# Patient Record
Sex: Male | Born: 1935 | Race: White | Hispanic: No | Smoking: Former smoker
Health system: Southern US, Community
[De-identification: ages and names within clinical notes are randomized; demographics above are authoritative.]

## PROBLEM LIST (undated history)

## (undated) DIAGNOSIS — I10 Essential (primary) hypertension: Secondary | ICD-10-CM

## (undated) DIAGNOSIS — I6529 Occlusion and stenosis of unspecified carotid artery: Secondary | ICD-10-CM

## (undated) DIAGNOSIS — E785 Hyperlipidemia, unspecified: Secondary | ICD-10-CM

## (undated) DIAGNOSIS — M1710 Unilateral primary osteoarthritis, unspecified knee: Secondary | ICD-10-CM

## (undated) DIAGNOSIS — M79604 Pain in right leg: Secondary | ICD-10-CM

## (undated) DIAGNOSIS — M549 Dorsalgia, unspecified: Secondary | ICD-10-CM

## (undated) DIAGNOSIS — M1990 Unspecified osteoarthritis, unspecified site: Secondary | ICD-10-CM

## (undated) DIAGNOSIS — H269 Unspecified cataract: Secondary | ICD-10-CM

## (undated) DIAGNOSIS — N400 Enlarged prostate without lower urinary tract symptoms: Secondary | ICD-10-CM

## (undated) DIAGNOSIS — M545 Low back pain, unspecified: Secondary | ICD-10-CM

## (undated) DIAGNOSIS — Z8709 Personal history of other diseases of the respiratory system: Secondary | ICD-10-CM

## (undated) DIAGNOSIS — R350 Frequency of micturition: Secondary | ICD-10-CM

## (undated) DIAGNOSIS — T148XXA Other injury of unspecified body region, initial encounter: Secondary | ICD-10-CM

## (undated) DIAGNOSIS — R21 Rash and other nonspecific skin eruption: Secondary | ICD-10-CM

## (undated) HISTORY — DX: Dorsalgia, unspecified: M54.9

## (undated) HISTORY — DX: Occlusion and stenosis of unspecified carotid artery: I65.29

## (undated) HISTORY — PX: KNEE SURGERY: SHX244

## (undated) HISTORY — PX: NOSE SURGERY: SHX723

## (undated) HISTORY — PX: CATARACT EXTRACTION: SUR2

## (undated) HISTORY — DX: Hyperlipidemia, unspecified: E78.5

## (undated) HISTORY — PX: COLONOSCOPY W/ BIOPSIES AND POLYPECTOMY: SHX1376

## (undated) HISTORY — PX: TONSILLECTOMY: SUR1361

## (undated) HISTORY — PX: CAROTID ENDARTERECTOMY: SUR193

## (undated) HISTORY — DX: Essential (primary) hypertension: I10

## (undated) HISTORY — DX: Pain in right leg: M79.604

## (undated) HISTORY — PX: HERNIA REPAIR: SHX51

## (undated) HISTORY — DX: Unspecified cataract: H26.9

## (undated) HISTORY — DX: Personal history of other diseases of the respiratory system: Z87.09

## (undated) HISTORY — PX: NASAL POLYPECTOMY: SHX510331

## (undated) HISTORY — DX: Benign prostatic hyperplasia without lower urinary tract symptoms: N40.0

## (undated) HISTORY — DX: Other injury of unspecified body region, initial encounter: T14.8XXA

## (undated) HISTORY — DX: Rash and other nonspecific skin eruption: R21

## (undated) HISTORY — PX: EYE SURGERY: SHX253

## (undated) HISTORY — DX: Low back pain, unspecified: M54.50

## (undated) HISTORY — DX: Unspecified osteoarthritis, unspecified site: M19.90

## (undated) HISTORY — PX: COLONOSCOPY: SHX174

## (undated) HISTORY — DX: Frequency of micturition: R35.0

---

## 1986-07-19 HISTORY — PX: HERNIA REPAIR: SHX51

## 1994-06-23 ENCOUNTER — Ambulatory Visit
Admit: 1994-06-23 | Disposition: A | Discharge status: Home / Self Care | Payer: Self-pay | Source: Ambulatory Visit | Admitting: Internal Medicine

## 1994-07-08 ENCOUNTER — Ambulatory Visit
Admit: 1994-07-08 | Disposition: A | Discharge status: Home / Self Care | Payer: Self-pay | Source: Ambulatory Visit | Admitting: Internal Medicine

## 1994-09-21 ENCOUNTER — Ambulatory Visit
Admit: 1994-09-21 | Disposition: A | Discharge status: Home / Self Care | Payer: Self-pay | Source: Ambulatory Visit | Admitting: Internal Medicine

## 1995-05-16 ENCOUNTER — Ambulatory Visit
Admit: 1995-05-16 | Disposition: A | Discharge status: Home / Self Care | Payer: Self-pay | Source: Ambulatory Visit | Admitting: Otolaryngology

## 1995-05-27 ENCOUNTER — Ambulatory Visit
Admit: 1995-05-27 | Disposition: A | Discharge status: Home / Self Care | Payer: Self-pay | Source: Ambulatory Visit | Admitting: Otolaryngology

## 1995-05-27 ENCOUNTER — Ambulatory Visit
Admit: 1995-05-27 | Disposition: A | Discharge status: Home / Self Care | Payer: Self-pay | Source: Ambulatory Visit | Admitting: Internal Medicine

## 1996-01-19 ENCOUNTER — Emergency Department: Admit: 1996-01-19 | Payer: Self-pay | Admitting: Emergency Medicine

## 1996-01-24 ENCOUNTER — Emergency Department: Admit: 1996-01-24 | Payer: Self-pay | Admitting: Emergency Medicine

## 1996-01-24 ENCOUNTER — Ambulatory Visit
Admit: 1996-01-24 | Disposition: A | Discharge status: Home / Self Care | Payer: Self-pay | Source: Ambulatory Visit | Admitting: Internal Medicine

## 1996-01-27 ENCOUNTER — Emergency Department: Admit: 1996-01-27 | Payer: Self-pay

## 1996-01-31 ENCOUNTER — Emergency Department: Admit: 1996-01-31 | Payer: Self-pay | Admitting: Emergency Medicine

## 1996-02-07 ENCOUNTER — Emergency Department: Admit: 1996-02-07 | Payer: Self-pay | Admitting: Emergency Medicine

## 1996-02-21 ENCOUNTER — Emergency Department: Admit: 1996-02-21 | Payer: Self-pay | Admitting: Emergency Medicine

## 1996-06-29 ENCOUNTER — Ambulatory Visit
Admit: 1996-06-29 | Disposition: A | Discharge status: Home / Self Care | Payer: Self-pay | Source: Ambulatory Visit | Admitting: Internal Medicine

## 1997-07-24 ENCOUNTER — Ambulatory Visit
Admit: 1997-07-24 | Disposition: A | Discharge status: Home / Self Care | Payer: Self-pay | Source: Ambulatory Visit | Admitting: Internal Medicine

## 1997-12-04 ENCOUNTER — Ambulatory Visit
Admit: 1997-12-04 | Disposition: A | Discharge status: Home / Self Care | Payer: Self-pay | Source: Ambulatory Visit | Admitting: Internal Medicine

## 1998-07-31 ENCOUNTER — Ambulatory Visit
Admit: 1998-07-31 | Disposition: A | Discharge status: Home / Self Care | Payer: Self-pay | Source: Ambulatory Visit | Admitting: Internal Medicine

## 1999-07-30 ENCOUNTER — Ambulatory Visit
Admit: 1999-07-30 | Disposition: A | Discharge status: Home / Self Care | Payer: Self-pay | Source: Ambulatory Visit | Admitting: Internal Medicine

## 2000-06-06 ENCOUNTER — Ambulatory Visit
Admit: 2000-06-06 | Disposition: A | Discharge status: Home / Self Care | Payer: Self-pay | Source: Ambulatory Visit | Admitting: Internal Medicine

## 2000-07-07 ENCOUNTER — Ambulatory Visit
Admit: 2000-07-07 | Disposition: A | Discharge status: Home / Self Care | Payer: Self-pay | Source: Ambulatory Visit | Admitting: Internal Medicine

## 2001-07-24 ENCOUNTER — Ambulatory Visit
Admit: 2001-07-24 | Disposition: A | Discharge status: Home / Self Care | Payer: Self-pay | Source: Ambulatory Visit | Admitting: Internal Medicine

## 2002-07-27 ENCOUNTER — Ambulatory Visit
Admit: 2002-07-27 | Disposition: A | Discharge status: Home / Self Care | Payer: Self-pay | Source: Ambulatory Visit | Admitting: Internal Medicine

## 2002-09-13 ENCOUNTER — Ambulatory Visit
Admit: 2002-09-13 | Disposition: A | Discharge status: Home / Self Care | Payer: Self-pay | Source: Ambulatory Visit | Admitting: Internal Medicine

## 2002-09-14 ENCOUNTER — Ambulatory Visit
Admit: 2002-09-14 | Disposition: A | Discharge status: Home / Self Care | Payer: Self-pay | Source: Ambulatory Visit | Admitting: Internal Medicine

## 2003-07-24 ENCOUNTER — Ambulatory Visit
Admit: 2003-07-24 | Disposition: A | Discharge status: Home / Self Care | Payer: Self-pay | Source: Ambulatory Visit | Admitting: Internal Medicine

## 2004-07-24 ENCOUNTER — Ambulatory Visit
Admit: 2004-07-24 | Disposition: A | Discharge status: Home / Self Care | Payer: Self-pay | Source: Ambulatory Visit | Admitting: Internal Medicine

## 2004-08-18 ENCOUNTER — Ambulatory Visit
Admit: 2004-08-18 | Disposition: A | Discharge status: Home / Self Care | Payer: Self-pay | Source: Ambulatory Visit | Admitting: Internal Medicine

## 2005-08-16 ENCOUNTER — Ambulatory Visit
Admit: 2005-08-16 | Disposition: A | Discharge status: Home / Self Care | Payer: Self-pay | Source: Ambulatory Visit | Admitting: Internal Medicine

## 2005-08-16 LAB — URINALYSIS
Bilirubin, UA: NEGATIVE
Glucose, UA: NEGATIVE
Ketones UA: NEGATIVE
Leukocyte Esterase, UA: NEGATIVE
Nitrite, UA: NEGATIVE
Protein, UR: NEGATIVE
Specific Gravity UA POCT: 1.025 (ref <=1.030)
Urine pH: 5 (ref 5.0–8.0)
Urobilinogen, UA: 0.2

## 2005-08-16 LAB — CBC WITH AUTO DIFFERENTIAL CERNER
Basophils Absolute: 0.1 /mm3 (ref 0.0–0.2)
Basophils: 1 % (ref 0–2)
Eosinophils Absolute: 0.3 /mm3 (ref 0.0–0.7)
Eosinophils: 5 % (ref 0–5)
Granulocytes Absolute: 3.3 /mm3 (ref 1.8–8.1)
Hematocrit: 52 % (ref 42.0–52.0)
Hgb: 17.9 G/DL — ABNORMAL HIGH (ref 13.0–17.0)
Lymphocytes Absolute: 1.5 /mm3 (ref 0.5–4.4)
Lymphocytes: 26 % (ref 15–41)
MCH: 32.2 PG — ABNORMAL HIGH (ref 28.0–32.0)
MCHC: 34.5 G/DL (ref 32.0–36.0)
MCV: 93.3 FL (ref 80.0–100.0)
MPV: 7.9 FL (ref 7.4–10.4)
Monocytes Absolute: 0.5 /mm3 (ref 0.0–1.2)
Monocytes: 9 % (ref 0–11)
Neutrophils %: 59 % (ref 52–75)
Platelets: 195 /mm3 (ref 140–400)
RBC: 5.57 /mm3 (ref 4.70–6.00)
RDW: 12.8 % (ref 11.5–15.0)
WBC: 5.6 /mm3 (ref 3.5–10.8)

## 2005-08-16 LAB — URINE MICROSCOPIC

## 2005-08-16 LAB — LIPID PANEL
Cholesterol: 173 MG/DL (ref ?–200)
HDL: 45 MG/DL (ref 40–60)
LDL Calculated: 106 MG/DL (ref 0–130)
Triglycerides: 108 MG/DL (ref 35–160)
VLDL Calculated: 22 MG/DL (ref 10–40)

## 2005-08-16 LAB — COMPREHENSIVE METABOLIC PANEL
ALT: 33 U/L (ref 21–72)
AST (SGOT): 27 U/L (ref 20–57)
Albumin/Globulin Ratio: 1.2 (ref 1.1–1.8)
Albumin: 4.2 G/DL (ref 3.7–5.1)
Alkaline Phosphatase: 67 U/L (ref 43–122)
BUN: 14 MG/DL (ref 7–21)
Bilirubin, Total: 0.9 MG/DL (ref 0.2–1.3)
CO2: 25 MEQ/L (ref 22–31)
Calcium: 9.8 MG/DL (ref 8.6–10.2)
Chloride: 105 MEQ/L (ref 98–107)
Creatinine: 1 MG/DL (ref 0.5–1.4)
Globulin: 3.5 G/DL (ref 2.0–3.7)
Glucose: 106 MG/DL — ABNORMAL HIGH (ref 70–105)
Potassium: 4.1 MEQ/L (ref 3.6–5.0)
Protein, Total: 7.7 G/DL (ref 6.0–8.0)
Sodium: 143 MEQ/L (ref 136–143)

## 2005-08-16 LAB — CALCIUM IONIZED-CALC. CERNER: Calcium Ionized Calculated: 2.1 mEQ/L (ref 1.9–2.3)

## 2005-08-16 LAB — VITAMIN B12: Vitamin B-12: 839 PG/ML (ref 239–931)

## 2005-08-16 LAB — PROSTATE SPECIFIC ANTIGEN FREE/TOTA (SOFT)
Free PSA-: 0.24 ng/mL
Free PSA/Total PSA: 0.31 RATIO
PSA Total: 0.778 ng/mL (ref 0.000–4.000)

## 2005-08-16 LAB — HOMOCYSTEINE, SERUM: HOMOCYSTEINE: 10 umol/L (ref 5–15)

## 2005-08-16 LAB — TSH: TSH: 1.72 u[IU]/mL (ref 0.34–4.82)

## 2005-08-16 LAB — GLYCO HEMOGLOBIN A1C CERNER: HgA1C Calc: 6 % (ref 4.8–6.0)

## 2005-08-16 LAB — FOLATE: Folate: 14.6 ng/dL (ref 2.8–20.0)

## 2005-08-16 LAB — GFR

## 2005-08-31 ENCOUNTER — Ambulatory Visit
Admit: 2005-08-31 | Disposition: A | Discharge status: Home / Self Care | Payer: Self-pay | Source: Ambulatory Visit | Admitting: Internal Medicine

## 2006-08-18 ENCOUNTER — Ambulatory Visit
Admit: 2006-08-18 | Disposition: A | Discharge status: Home / Self Care | Payer: Self-pay | Source: Ambulatory Visit | Admitting: Internal Medicine

## 2006-08-18 LAB — CBC WITH AUTO DIFFERENTIAL CERNER
Basophils Absolute: 0 /mm3 (ref 0.0–0.2)
Basophils: 1 % (ref 0–2)
Eosinophils Absolute: 0.2 /mm3 (ref 0.0–0.7)
Eosinophils: 4 % (ref 0–5)
Granulocytes Absolute: 3.5 /mm3 (ref 1.8–8.1)
Hematocrit: 46.9 % (ref 42.0–52.0)
Hgb: 16.6 G/DL (ref 13.0–17.0)
Lymphocytes Absolute: 1.6 /mm3 (ref 0.5–4.4)
Lymphocytes: 27 % (ref 15–41)
MCH: 32.3 PG — ABNORMAL HIGH (ref 28.0–32.0)
MCHC: 35.5 G/DL (ref 32.0–36.0)
MCV: 90.9 FL (ref 80.0–100.0)
MPV: 7.3 FL — ABNORMAL LOW (ref 7.4–10.4)
Monocytes Absolute: 0.6 /mm3 (ref 0.0–1.2)
Monocytes: 10 % (ref 0–11)
Neutrophils %: 59 % (ref 52–75)
Platelets: 234 /mm3 (ref 140–400)
RBC: 5.16 /mm3 (ref 4.70–6.00)
RDW: 13.1 % (ref 11.5–15.0)
WBC: 5.9 /mm3 (ref 3.5–10.8)

## 2006-08-18 LAB — HOMOCYSTEINE, SERUM: HOMOCYSTEINE: 9 umol/L (ref 5–15)

## 2006-08-18 LAB — COMPREHENSIVE METABOLIC PANEL
ALT: 24 U/L (ref 21–72)
AST (SGOT): 28 U/L (ref 20–57)
Albumin/Globulin Ratio: 1.3 (ref 1.1–1.8)
Albumin: 4.6 G/DL (ref 3.7–5.1)
Alkaline Phosphatase: 98 U/L (ref 43–122)
BUN: 12 MG/DL (ref 7–21)
Bilirubin, Total: 0.7 MG/DL (ref 0.2–1.3)
CO2: 30 MEQ/L (ref 22–31)
Calcium: 9.1 MG/DL (ref 8.6–10.2)
Chloride: 104 MEQ/L (ref 98–107)
Creatinine: 0.9 MG/DL (ref 0.5–1.4)
Globulin: 3.5 G/DL (ref 2.0–3.7)
Glucose: 106 MG/DL — ABNORMAL HIGH (ref 70–105)
Potassium: 4 MEQ/L (ref 3.6–5.0)
Protein, Total: 8.1 G/DL — ABNORMAL HIGH (ref 6.0–8.0)
Sodium: 142 MEQ/L (ref 136–143)

## 2006-08-18 LAB — URINALYSIS
Bilirubin, UA: NEGATIVE
Blood, UA: NEGATIVE
Glucose, UA: NEGATIVE
Ketones UA: NEGATIVE
Leukocyte Esterase, UA: NEGATIVE
Nitrite, UA: NEGATIVE
Protein, UR: NEGATIVE
Specific Gravity UA POCT: 1.025 (ref <=1.030)
Urine pH: 6 (ref 5.0–8.0)
Urobilinogen, UA: 0.2

## 2006-08-18 LAB — LIPID PANEL
Cholesterol: 164 MG/DL (ref 50–200)
HDL: 47 MG/DL (ref 29–67)
LDL Calculated: 99 MG/DL (ref 0–130)
Triglycerides: 91 MG/DL (ref 35–135)
VLDL Calculated: 18 MG/DL (ref 10–40)

## 2006-08-18 LAB — GFR

## 2006-08-18 LAB — PROSTATE SPECIFIC ANTIGEN FREE/TOTA (SOFT)
Free PSA-: 0.19 ng/mL
Free PSA/Total PSA: 0.35 RATIO
PSA Total: 0.544 ng/mL (ref 0.000–4.000)

## 2006-08-18 LAB — CALCIUM IONIZED-CALC. CERNER: Calcium Ionized Calculated: 1.8 mEQ/L — ABNORMAL LOW (ref 1.9–2.3)

## 2006-08-18 LAB — VITAMIN B12: Vitamin B-12: 818 PG/ML (ref 239–931)

## 2006-08-18 LAB — TSH: TSH: 1.13 u[IU]/mL (ref 0.34–4.82)

## 2006-08-18 LAB — FOLATE: Folate: 16.4 ng/dL (ref 2.8–20.0)

## 2006-08-18 LAB — GLYCO HEMOGLOBIN A1C CERNER: HgA1C Calc: 5.6 % (ref 4.8–6.0)

## 2006-08-23 LAB — VITAMIN D-25 HYDROXY (D2/D3/TOTAL)

## 2007-09-19 ENCOUNTER — Ambulatory Visit
Admit: 2007-09-19 | Disposition: A | Discharge status: Home / Self Care | Payer: Self-pay | Source: Ambulatory Visit | Admitting: Internal Medicine

## 2007-09-19 LAB — CBC AND DIFFERENTIAL
Basophils Absolute: 0.1 /mm3 (ref 0.0–0.2)
Basophils: 1 % (ref 0–2)
Eosinophils Absolute: 0.3 /mm3 (ref 0.0–0.7)
Eosinophils: 6 % — ABNORMAL HIGH (ref 0–5)
Granulocytes Absolute: 2.4 /mm3 (ref 1.8–8.1)
Hematocrit: 48.4 % (ref 42.0–52.0)
Hgb: 16 G/DL (ref 13.0–17.0)
Immature Granulocytes Absolute: 0 CUMM (ref 0.0–0.0)
Immature Granulocytes: 0 % (ref 0–1)
Lymphocytes Absolute: 1.3 /mm3 (ref 0.5–4.4)
Lymphocytes: 28 % (ref 15–41)
MCH: 31 PG (ref 28.0–32.0)
MCHC: 33.1 G/DL (ref 32.0–36.0)
MCV: 93.8 FL (ref 80.0–100.0)
MPV: 10.3 FL (ref 9.4–12.3)
Monocytes Absolute: 0.6 /mm3 (ref 0.0–1.2)
Monocytes: 12 % — ABNORMAL HIGH (ref 0–11)
Neutrophils %: 53 % (ref 52–75)
Platelets: 168 /mm3 (ref 140–400)
RBC: 5.16 /mm3 (ref 4.70–6.00)
RDW: 12.6 % (ref 11.5–15.0)
WBC: 4.54 /mm3 (ref 3.50–10.80)

## 2007-09-19 LAB — LIPID PANEL
Cholesterol: 171 MG/DL (ref 50–200)
HDL: 53 MG/DL (ref 29–67)
LDL Calculated: 98 MG/DL (ref 0–130)
Triglycerides: 98 MG/DL (ref 35–135)
VLDL Calculated: 20 MG/DL (ref 10–40)

## 2007-09-19 LAB — COMPREHENSIVE METABOLIC PANEL
ALT: 33 U/L (ref 21–72)
AST (SGOT): 36 U/L (ref 20–57)
Albumin/Globulin Ratio: 1.3 (ref 1.1–1.8)
Albumin: 4.3 G/DL (ref 3.7–5.1)
Alkaline Phosphatase: 77 U/L (ref 43–122)
BUN: 12 MG/DL (ref 7–21)
Bilirubin, Total: 1 MG/DL (ref 0.2–1.3)
CO2: 27 MEQ/L (ref 22–31)
Calcium: 9.6 MG/DL (ref 8.6–10.2)
Chloride: 106 MEQ/L (ref 98–107)
Creatinine: 0.9 MG/DL (ref 0.5–1.4)
Globulin: 3.3 G/DL (ref 2.0–3.7)
Glucose: 112 MG/DL — ABNORMAL HIGH (ref 70–105)
Potassium: 3.9 MEQ/L (ref 3.6–5.0)
Protein, Total: 7.6 G/DL (ref 6.0–8.0)
Sodium: 144 MEQ/L — ABNORMAL HIGH (ref 136–143)

## 2007-09-19 LAB — URINALYSIS WITH MICROSCOPIC
Bilirubin, UA: NEGATIVE
Blood, UA: NEGATIVE
Glucose, UA: NEGATIVE
Ketones UA: NEGATIVE
Leukocyte Esterase, UA: NEGATIVE
Nitrite, UA: NEGATIVE
Protein, UR: NEGATIVE
Specific Gravity UA POCT: >=1.03 (ref <=1.030)
Urine pH: 5.5 (ref 5.0–8.0)
Urobilinogen, UA: 0.2

## 2007-09-19 LAB — CALCIUM IONIZED-CALC. CERNER: Calcium Ionized Calculated: 2 mEQ/L (ref 1.9–2.3)

## 2007-09-19 LAB — TSH: TSH: 1.26 u[IU]/mL (ref 0.34–4.82)

## 2007-09-19 LAB — GLYCO HEMOGLOBIN A1C CERNER: HgA1C Calc: 5.8 % (ref 4.8–6.0)

## 2007-09-19 LAB — GFR

## 2007-09-19 LAB — VITAMIN B12: Vitamin B-12: 601 PG/ML (ref 239–931)

## 2007-09-19 LAB — PROSTATE SPECIFIC ANTIGEN FREE/TOTA (SOFT)
Free PSA-: 0.23 ng/mL
Free PSA/Total PSA: 0.3 RATIO
PSA Total: 0.769 ng/mL (ref 0.000–4.000)

## 2007-09-19 LAB — FOLATE: Folate: 17.5 ng/dL (ref 2.8–20.0)

## 2007-09-19 LAB — HOMOCYSTEINE, SERUM: HOMOCYSTEINE: 10 umol/L (ref 5–15)

## 2007-09-21 LAB — VITAMIN D-25 HYDROXY (D2/D3/TOTAL)

## 2008-09-09 ENCOUNTER — Ambulatory Visit
Admit: 2008-09-09 | Disposition: A | Discharge status: Home / Self Care | Payer: Self-pay | Source: Ambulatory Visit | Admitting: Internal Medicine

## 2008-09-09 LAB — PROSTATE SPECIFIC ANTIGEN FREE/TOTA (SOFT)
Free PSA-: 0.048 ng/mL (ref 0.000–0.500)
Free PSA/Total PSA: 0.27 RATIO
PSA Total: 0.179 ng/mL (ref 0.000–4.000)

## 2008-09-09 LAB — URINALYSIS WITH MICROSCOPIC

## 2008-09-09 LAB — LIPID PANEL
Cholesterol: 182 MG/DL (ref 50–200)
HDL: 52 MG/DL (ref 29–67)
LDL Calculated: 111 MG/DL (ref 0–130)
Triglycerides: 96 MG/DL (ref 35–135)
VLDL Calculated: 19 MG/DL (ref 10–40)

## 2008-09-09 LAB — CBC AND DIFFERENTIAL
Basophils Absolute: 0.1 /mm3 (ref 0.0–0.2)
Basophils: 1 % (ref 0–2)
Eosinophils Absolute: 0.3 /mm3 (ref 0.0–0.7)
Eosinophils: 6 % — ABNORMAL HIGH (ref 0–5)
Granulocytes Absolute: 2.9 /mm3 (ref 1.8–8.1)
Hematocrit: 49.5 % (ref 42.0–52.0)
Hgb: 16.8 G/DL (ref 13.0–17.0)
Immature Granulocytes Absolute: 0 CUMM (ref 0.0–0.0)
Immature Granulocytes: 0 % (ref 0–1)
Lymphocytes Absolute: 1.4 /mm3 (ref 0.5–4.4)
Lymphocytes: 26 % (ref 15–41)
MCH: 32.1 PG — ABNORMAL HIGH (ref 28.0–32.0)
MCHC: 33.9 G/DL (ref 32.0–36.0)
MCV: 94.5 FL (ref 80.0–100.0)
MPV: 10.1 FL (ref 9.4–12.3)
Monocytes Absolute: 0.5 /mm3 (ref 0.0–1.2)
Monocytes: 10 % (ref 0–11)
Neutrophils %: 56 % (ref 52–75)
Platelets: 203 /mm3 (ref 140–400)
RBC: 5.24 /mm3 (ref 4.70–6.00)
RDW: 12.6 % (ref 11.5–15.0)
WBC: 5.14 /mm3 (ref 3.50–10.80)

## 2008-09-09 LAB — HEMOGLOBIN A1C: Hemoglobin A1C: 5.8 % (ref ?–6.0)

## 2008-09-09 LAB — URINALYSIS
Bilirubin, UA: NEGATIVE
Blood, UA: NEGATIVE
Glucose, UA: NEGATIVE
Ketones UA: NEGATIVE
Leukocyte Esterase, UA: NEGATIVE
Nitrite, UA: NEGATIVE
Protein, UR: NEGATIVE
Specific Gravity UA POCT: >=1.03 (ref <=1.030)
Urine pH: 5 (ref 5.0–8.0)
Urobilinogen, UA: 0.2

## 2008-09-09 LAB — HOMOCYSTEINE, SERUM: HOMOCYSTEINE: 10 umol/L (ref 5–15)

## 2008-09-09 LAB — COMPREHENSIVE METABOLIC PANEL
ALT: 28 U/L (ref 21–72)
AST (SGOT): 29 U/L (ref 20–57)
Albumin/Globulin Ratio: 1.3 (ref 1.1–1.8)
Albumin: 4.2 G/DL (ref 3.7–5.1)
Alkaline Phosphatase: 75 U/L (ref 43–122)
BUN: 18 MG/DL (ref 7–21)
Bilirubin, Total: 0.9 MG/DL (ref 0.2–1.3)
CO2: 29 MEQ/L (ref 22–31)
Calcium: 9.4 MG/DL (ref 8.6–10.2)
Chloride: 105 MEQ/L (ref 98–107)
Creatinine: 0.9 MG/DL (ref 0.5–1.4)
Globulin: 3.3 G/DL (ref 2.0–3.7)
Glucose: 108 MG/DL — ABNORMAL HIGH (ref 70–105)
Potassium: 4.4 MEQ/L (ref 3.6–5.0)
Protein, Total: 7.5 G/DL (ref 6.0–8.0)
Sodium: 140 MEQ/L (ref 136–143)

## 2008-09-09 LAB — VITAMIN B12: Vitamin B-12: 627 PG/ML (ref 239–931)

## 2008-09-09 LAB — FOLATE: Folate: 10.9 ng/dL (ref 2.8–20.0)

## 2008-09-09 LAB — GFR

## 2008-09-09 LAB — CH/HDL: Cholesterol / HDL Ratio: 3.5 ratio

## 2008-09-09 LAB — CALCIUM IONIZED-CALC. CERNER: Calcium Ionized Calculated: 2 mEQ/L (ref 1.9–2.3)

## 2008-09-09 LAB — TSH: TSH: 1.44 u[IU]/mL (ref 0.34–4.82)

## 2008-09-15 LAB — VITAMIN D-25 HYDROXY (D2/D3/TOTAL)

## 2009-09-11 ENCOUNTER — Ambulatory Visit
Admit: 2009-09-11 | Disposition: A | Discharge status: Home / Self Care | Payer: Self-pay | Source: Ambulatory Visit | Admitting: Internal Medicine

## 2009-09-11 LAB — URINALYSIS WITH MICROSCOPIC
Bilirubin, UA: NEGATIVE
Blood, UA: NEGATIVE
Glucose, UA: NEGATIVE
Ketones UA: NEGATIVE
Leukocyte Esterase, UA: NEGATIVE
Nitrite, UA: NEGATIVE
Protein, UR: 30 — AB
Specific Gravity UA POCT: 1.03 (ref 1.001–1.035)
Urine pH: 5 (ref 5.0–8.0)
Urobilinogen, UA: NORMAL mg/dL

## 2009-09-11 LAB — COMPREHENSIVE METABOLIC PANEL
ALT: 40 U/L (ref 21–72)
AST (SGOT): 29 U/L (ref 20–57)
Albumin/Globulin Ratio: 1.2 (ref 1.1–1.8)
Albumin: 4.1 g/dL (ref 3.7–5.1)
Alkaline Phosphatase: 109 U/L (ref 43–122)
BUN: 16 mg/dL (ref 7–21)
Bilirubin, Total: 0.9 mg/dL (ref 0.2–1.3)
CO2: 29 mEq/L (ref 22–31)
Calcium: 9.6 mg/dL (ref 8.6–10.2)
Chloride: 105 mEq/L (ref 98–107)
Creatinine: 0.9 mg/dL (ref 0.5–1.4)
Globulin: 3.4 g/dL (ref 2.0–3.7)
Glucose: 139 mg/dL — ABNORMAL HIGH (ref 70–100)
Potassium: 4.4 mEq/L (ref 3.6–5.0)
Protein, Total: 7.5 g/dL (ref 6.0–8.0)
Sodium: 142 mEq/L (ref 136–143)

## 2009-09-11 LAB — TSH: TSH: 1.58 u[IU]/mL (ref 0.465–4.680)

## 2009-09-11 LAB — LIPID PANEL
Cholesterol / HDL Ratio: 3.6 Index
Cholesterol: 185 mg/dL (ref 0–199)
HDL: 51 mg/dL (ref >40)
LDL Calculated: 114 mg/dL — ABNORMAL HIGH (ref 0–99)
Triglycerides: 99 mg/dL (ref 34–149)
VLDL Calculated: 20 mg/dL (ref 10–40)

## 2009-09-11 LAB — CBC AND DIFFERENTIAL
Baso(Absolute): 0.1 10*3/uL (ref 0.00–0.20)
Basophils: 1 % (ref 0–2)
Eosinophils Absolute: 0.5 10*3/uL (ref 0.00–0.70)
Eosinophils: 9 % — ABNORMAL HIGH (ref 0–5)
Hematocrit: 49.3 % (ref 42.0–52.0)
Hgb: 16.6 g/dL (ref 13.0–17.0)
Immature Granulocytes Absolute: 0.03 10*3/uL
Immature Granulocytes: 1 % (ref 0–1)
Lymphocytes Absolute: 1.5 10*3/uL (ref 0.50–4.40)
Lymphocytes: 27 % (ref 15–41)
MCH: 31.9 pg (ref 28.0–32.0)
MCHC: 33.7 g/dL (ref 32.0–36.0)
MCV: 94.8 fL (ref 80.0–100.0)
MPV: 9.9 fL (ref 9.4–12.3)
Monocytes Absolute: 0.5 10*3/uL (ref 0.00–1.20)
Monocytes: 9 % (ref 0–11)
Neutrophils Absolute: 3 10*3/uL
Neutrophils: 54 % (ref 52–75)
Platelets: 168 10*3/uL (ref 140–400)
RBC: 5.2 10*6/uL (ref 4.70–6.00)
RDW: 13 % (ref 12–15)
WBC: 5.58 10*3/uL (ref 3.50–10.80)

## 2009-09-11 LAB — HOMOCYSTEINE, SERUM: HOMOCYSTEINE: 10.07 umol/L (ref 5.08–15.39)

## 2009-09-11 LAB — HEMOGLOBIN A1C: Hemoglobin A1C: 5.7 % (ref 0.0–6.0)

## 2009-09-11 LAB — HEMOLYSIS INDEX: Hemolysis Index: 10

## 2009-09-11 LAB — VITAMIN B12: Vitamin B-12: 551 pg/mL (ref 211–911)

## 2009-09-11 LAB — GFR: EGFR: >60

## 2009-09-11 LAB — FOLATE: Folate: 19.1 ng/mL

## 2009-09-12 LAB — VITAMIN D-25 HYDROXY (D2/D3/TOTAL)
Vitamin D 25-OH D2: <4 ng/mL
Vitamin D 25-OH D3: 33 ng/mL
Vitamin D 25-OH Total: 33 ng/mL (ref 30–100)

## 2009-10-02 ENCOUNTER — Ambulatory Visit
Admit: 2009-10-02 | Disposition: A | Discharge status: Home / Self Care | Payer: Self-pay | Source: Ambulatory Visit | Admitting: Internal Medicine

## 2009-10-16 ENCOUNTER — Ambulatory Visit
Admit: 2009-10-16 | Disposition: A | Discharge status: Home / Self Care | Payer: Self-pay | Source: Ambulatory Visit | Admitting: Specialist

## 2009-10-16 LAB — I-STAT CREATININE: Creatinine I-Stat: 0.9 mg/dL (ref 0.5–1.4)

## 2009-11-17 ENCOUNTER — Ambulatory Visit
Admit: 2009-11-17 | Disposition: A | Discharge status: Home / Self Care | Payer: Self-pay | Source: Ambulatory Visit | Admitting: Specialist

## 2009-11-17 LAB — BASIC METABOLIC PANEL
BUN: 15 mg/dL (ref 9.0–21.0)
CO2: 24 mEq/L (ref 22–29)
Calcium: 9.8 mg/dL (ref 7.9–10.6)
Chloride: 104 mEq/L (ref 98–107)
Creatinine: 1 mg/dL (ref 0.7–1.3)
Glucose: 107 mg/dL — ABNORMAL HIGH (ref 70–100)
Potassium: 4 mEq/L (ref 3.5–5.1)
Sodium: 141 mEq/L (ref 136–145)

## 2009-11-17 LAB — CBC
Hematocrit: 47.6 % (ref 42.0–52.0)
Hgb: 16.2 g/dL (ref 13.0–17.0)
MCH: 31.6 pg (ref 28.0–32.0)
MCHC: 34 g/dL (ref 32.0–36.0)
MCV: 92.8 fL (ref 80.0–100.0)
MPV: 11.4 fL (ref 9.4–12.3)
Platelets: 181 10*3/uL (ref 140–400)
RBC: 5.13 10*6/uL (ref 4.70–6.00)
RDW: 13 % (ref 12–15)
WBC: 5.9 10*3/uL (ref 3.50–10.80)

## 2009-11-17 LAB — PT AND APTT F/C
PT INR: 1 (ref 0.9–1.1)
PT: 13.7 s (ref 12.6–15.0)
PTT: 35 s (ref 23–37)

## 2009-11-17 LAB — HEMOLYSIS INDEX: Hemolysis Index: 17 Index (ref 0–18)

## 2009-11-17 LAB — GFR: EGFR: >60

## 2009-11-25 ENCOUNTER — Inpatient Hospital Stay
Admission: RE | Admit: 2009-11-25 | Disposition: A | Discharge status: Home / Self Care | Payer: Self-pay | Source: Ambulatory Visit | Admitting: Specialist

## 2009-11-25 ENCOUNTER — Ambulatory Visit: Payer: Self-pay

## 2009-11-25 HISTORY — PX: CAROTID ENDARTERECTOMY: SUR193

## 2009-11-25 LAB — TYPE AND SCREEN
AB Screen Gel: NEGATIVE
ABO Rh: O NEG

## 2009-11-26 LAB — COMPREHENSIVE METABOLIC PANEL
ALT: 21 U/L (ref 3–36)
AST (SGOT): 24 U/L (ref 10–41)
Albumin/Globulin Ratio: 1.5 (ref 1.1–1.8)
Albumin: 3.8 g/dL (ref 3.4–4.9)
Alkaline Phosphatase: 73 U/L (ref 43–112)
BUN: 24 mg/dL — ABNORMAL HIGH (ref 8–20)
Bilirubin, Total: 0.7 mg/dL (ref 0.1–1.0)
CO2: 28 mEq/L (ref 21–30)
Calcium: 9.1 mg/dL (ref 8.6–10.2)
Chloride: 105 mEq/L (ref 98–107)
Creatinine: 0.9 mg/dL (ref 0.6–1.5)
Globulin: 2.6 g/dL (ref 2.0–3.7)
Glucose: 171 mg/dL — ABNORMAL HIGH (ref 70–100)
Potassium: 4.3 mEq/L (ref 3.6–5.0)
Protein, Total: 6.4 g/dL (ref 6.0–8.0)
Sodium: 141 mEq/L (ref 136–146)

## 2009-11-26 LAB — LAB USE ONLY - HISTORICAL SURGICAL PATHOLOGY

## 2009-11-26 LAB — MAGNESIUM: Magnesium: 2.1 mg/dL (ref 1.6–2.3)

## 2009-11-26 LAB — GFR: EGFR: >60

## 2009-11-26 LAB — PHOSPHORUS: Phosphorus: 2.6 mg/dL (ref 2.5–4.5)

## 2010-03-20 ENCOUNTER — Ambulatory Visit
Admit: 2010-03-20 | Disposition: A | Discharge status: Home / Self Care | Payer: Self-pay | Source: Ambulatory Visit | Admitting: Internal Medicine

## 2010-03-20 LAB — GFR: EGFR: >60

## 2010-03-20 LAB — LIPID PANEL
Cholesterol / HDL Ratio: 2.5 Index
Cholesterol: 124 mg/dL (ref 0–199)
HDL: 50 mg/dL (ref >40)
LDL Calculated: 59 mg/dL (ref 0–99)
Triglycerides: 75 mg/dL (ref 34–149)
VLDL Calculated: 15 mg/dL (ref 10–40)

## 2010-03-20 LAB — COMPREHENSIVE METABOLIC PANEL
ALT: 33 U/L (ref 21–72)
AST (SGOT): 28 U/L (ref 20–57)
Albumin/Globulin Ratio: 1.2 (ref 1.1–1.8)
Albumin: 4.1 g/dL (ref 3.7–5.1)
Alkaline Phosphatase: 84 U/L (ref 43–122)
BUN: 15 mg/dL (ref 7–21)
Bilirubin, Total: 1 mg/dL (ref 0.2–1.3)
CO2: 25 mEq/L (ref 22–31)
Calcium: 9.1 mg/dL (ref 8.6–10.2)
Chloride: 107 mEq/L (ref 98–107)
Creatinine: 0.9 mg/dL (ref 0.5–1.4)
Globulin: 3.3 g/dL (ref 2.0–3.7)
Glucose: 116 mg/dL — ABNORMAL HIGH (ref 70–100)
Potassium: 4.1 mEq/L (ref 3.6–5.0)
Protein, Total: 7.4 g/dL (ref 6.0–8.0)
Sodium: 140 mEq/L (ref 136–143)

## 2010-03-20 LAB — CK: Creatine Kinase (CK): 124 U/L (ref 20–297)

## 2010-03-20 LAB — HEMOLYSIS INDEX: Hemolysis Index: 11 Index — ABNORMAL HIGH (ref 0–9)

## 2010-03-20 LAB — HEMOGLOBIN A1C: Hemoglobin A1C: 5.5 % (ref 0.0–6.0)

## 2010-07-13 LAB — PROSTATE SPECIFIC ANTIGEN FREE/TOTA (SOFT)
PSA, Free: 0.085 ng/mL (ref 0.000–0.500)
Prostate Specific Antigen, Total: 0.236 ng/mL (ref 0.000–4.000)

## 2010-09-28 ENCOUNTER — Ambulatory Visit
Admit: 2010-09-28 | Disposition: A | Discharge status: Home / Self Care | Payer: Self-pay | Source: Ambulatory Visit | Admitting: Internal Medicine

## 2010-09-28 LAB — CBC AND DIFFERENTIAL
Basophils Absolute Automated: 0.07 10*3/uL (ref 0.00–0.20)
Basophils Automated: 1 % (ref 0–2)
Eosinophils Absolute Automated: 0.4 10*3/uL (ref 0.00–0.70)
Eosinophils Automated: 7 % — ABNORMAL HIGH (ref 0–5)
Hematocrit: 48.8 % (ref 42.0–52.0)
Hgb: 15.9 g/dL (ref 13.0–17.0)
Immature Granulocytes Absolute: 0.02 10*3/uL
Immature Granulocytes: 0 % (ref 0–1)
Lymphocytes Absolute Automated: 2 10*3/uL (ref 0.50–4.40)
Lymphocytes Automated: 34 % (ref 15–41)
MCH: 31.1 pg (ref 28.0–32.0)
MCHC: 32.6 g/dL (ref 32.0–36.0)
MCV: 95.5 fL (ref 80.0–100.0)
MPV: 10.9 fL (ref 9.4–12.3)
Monocytes Absolute Automated: 0.67 10*3/uL (ref 0.00–1.20)
Monocytes: 11 % (ref 0–11)
Neutrophils Absolute: 2.77 10*3/uL (ref 1.80–8.10)
Neutrophils: 47 % — ABNORMAL LOW (ref 52–75)
Nucleated RBC: 0 /100 WBC
Platelets: 190 10*3/uL (ref 140–400)
RBC: 5.11 10*6/uL (ref 4.70–6.00)
RDW: 13 % (ref 12–15)
WBC: 5.93 10*3/uL (ref 3.50–10.80)

## 2010-09-28 LAB — HEMOLYSIS INDEX: Hemolysis Index: 6 Index (ref 0–9)

## 2010-09-28 LAB — COMPREHENSIVE METABOLIC PANEL
ALT: 21 U/L (ref 0–55)
AST (SGOT): 24 U/L (ref 5–34)
Albumin/Globulin Ratio: 1.1 (ref 0.9–2.2)
Albumin: 3.9 g/dL (ref 3.5–5.0)
Alkaline Phosphatase: 72 U/L (ref 40–150)
BUN: 20 mg/dL (ref 8.0–20.0)
Bilirubin, Total: 0.7 mg/dL (ref 0.1–1.2)
CO2: 25 mEq/L (ref 21–30)
Calcium: 9.6 mg/dL (ref 7.9–10.6)
Chloride: 106 mEq/L (ref 96–109)
Creatinine: 0.9 mg/dL (ref 0.5–1.5)
Globulin: 3.4 g/dL (ref 2.0–3.7)
Glucose: 115 mg/dL — ABNORMAL HIGH (ref 70–100)
Potassium: 4 mEq/L (ref 3.5–5.3)
Protein, Total: 7.3 g/dL (ref 6.0–8.3)
Sodium: 144 mEq/L (ref 135–146)

## 2010-09-28 LAB — URINALYSIS, REFLEX TO MICROSCOPIC EXAM IF INDICATED
Bilirubin, UA: NEGATIVE
Blood, UA: NEGATIVE
Glucose, UA: NEGATIVE
Ketones UA: NEGATIVE
Leukocyte Esterase, UA: NEGATIVE
Nitrite, UA: NEGATIVE
Protein, UR: NEGATIVE
Specific Gravity UA POCT: 1.017 (ref 1.001–1.035)
Urine pH: 6 (ref 5.0–8.0)
Urobilinogen, UA: 0.2 EU/dL

## 2010-09-28 LAB — LIPID PANEL
Cholesterol / HDL Ratio: 2.4 Index
Cholesterol: 138 mg/dL (ref 0–199)
HDL: 58 mg/dL (ref >40)
LDL Calculated: 66 mg/dL (ref 0–99)
Triglycerides: 69 mg/dL (ref 34–149)
VLDL Calculated: 14 mg/dL (ref 10–40)

## 2010-09-28 LAB — GFR: EGFR: >60

## 2010-09-28 LAB — PROSTATE SPECIFIC ANTIGEN FREE/TOTA (SOFT)
PSA Free and Total Ratio: 0.3
PSA, Free: 0.16 ng/mL (ref 0.000–0.500)
Prostate Specific Antigen, Total: 0.533 ng/mL (ref 0.000–4.000)

## 2010-09-28 LAB — HOMOCYSTEINE, SERUM: HOMOCYSTEINE: 12.01 umol/L (ref 5.08–15.39)

## 2010-09-28 LAB — VITAMIN B12: Vitamin B-12: 543 pg/mL (ref 211–911)

## 2010-09-28 LAB — TSH: TSH: 1.11 u[IU]/mL (ref 0.35–4.94)

## 2010-09-28 LAB — HEMOGLOBIN A1C: Hemoglobin A1C: 5.6 % (ref 0.0–6.0)

## 2010-09-28 LAB — FOLATE: Folate: 19.7 ng/mL

## 2010-09-29 LAB — VITAMIN D-25 HYDROXY (D2/D3/TOTAL)
Vitamin D 25-OH D2: <4 ng/mL
Vitamin D 25-OH D3: 30 ng/mL
Vitamin D 25-OH Total: 30 ng/mL (ref 30–100)

## 2010-10-27 ENCOUNTER — Ambulatory Visit: Payer: Self-pay

## 2010-10-28 LAB — LAB USE ONLY - HISTORICAL SURGICAL PATHOLOGY

## 2011-04-04 LAB — ECG 12-LEAD
Atrial Rate: 89 {beats}/min
P Axis: 55 degrees
P-R Interval: 168 ms
Q-T Interval: 398 ms
QRS Duration: 140 ms
QTC Calculation (Bezet): 484 ms
R Axis: -18 degrees
T Axis: 23 degrees
Ventricular Rate: 89 {beats}/min

## 2011-05-07 NOTE — Op Note (Signed)
George, King      MRN:          [id number]      Account:      [id number]      Document ID:  [id number]      Procedure Date: 11/25/2009            Admit Date: 11/25/2009            Patient Location: FI266-02      Patient Type: I            SURGEON: Mabel Unrein Marcy Panning MD      ASSISTANT:                  PREOPERATIVE DIAGNOSIS:      Critical stenosis, left internal carotid artery.            POSTOPERATIVE DIAGNOSIS:      Critical stenosis, left internal carotid artery.            TITLE OF PROCEDURE:      1.  Left carotid endarterectomy with bovine pericardial patch graft.      2.  Ultrasound examination of carotid repair.            ASSISTANT:      Floydene Flock, surgery senior resident.            ANESTHESIA:      Regional block.            INDICATIONS:      The patient is an 77 year old gentleman with preexisting hypertension,      hyperlipidemia, prior tobacco abuse.  He presented with nonfocal symptoms      of cerebrovascular ischemia.  Workup by his internist with duplex      ultrasonography demonstrated a focal high-grade stenosis in the left      internal carotid artery from atherosclerotic plaque formation.  Luminal      stenosis was estimated to be greater than 70%.  MRA examination confirmed      the presence of such a lesion without any significant compromise of the      contralateral internal carotid artery and no significant intracranial      disease.  Vertebral arteries were patent.  The patient was brought to the      operating room for purposes of a left carotid endarterectomy after the      risks of the procedure had been explained in detail to him and his family.      They understood and consented to the same.            DESCRIPTION OF PROCEDURE:      The left carotid bifurcation was marked by ultrasound in the preoperative      holding area and a regional block was placed.  The patient was prepped and      draped in standard fashion.  Limited incision centered over the bifurcation      along  the anterior border of the left sternocleidomastoid muscle was made.      Platysma layer was divided with electrocautery.  Working medial to the                                   Page 1 of 3      George, King      MRN:          [id number]  Account:      [id number]      Document ID:  [id number]      Procedure Date: 11/25/2009            muscle, common facial vein was ligated in continuity and divided.  Carotid      sheath was next opened.  The common carotid with the bifurcation of the      external and internal carotid vessels were clearly defined.  Each of the 3      vessels were controlled with Silastic Vesseloops.            Vagus and hypoglossal nerves were carefully preserved in the course of      dissection.            Five-thousand units of heparin were given, and after allowing circulation      time of 3 minutes, temporary clamping of the external and common carotid      artery was carried out.  This was well tolerated.  The patient remained      lucid and alert and was able to squeeze a ball with his right hand on      command every 30 seconds.  Did not need shunting.            Arteriotomy was made on the lateral aspect of the common, extended through      the calcific plaque occupying the bulb of the left internal carotid artery      and then beyond the limits of the lesion into a section of the internal      carotid artery that was once again normal.  Maximal stenosis was in the mid      left internal carotid artery where luminal stenosis was in the 80% range.      Surface of the plaque was smooth.  There was no hemorrhage within the      plaque.            Meticulous endarterectomy of the plaque at the bifurcation was carried out,      obtaining excellent endpoints under direct vision in the internal and by      eversion from the external.  Plaque was transected in the common and      removed.  Loose medial fibers were removed.  Endarterectomized surface was      copiously irrigated with  heparinized saline, followed by instillation of      concentrated heparin over the surface.            Bovine pericardial patch was used to close the arteriotomy with 6-0 Prolene      suture under 2.5x loupe magnification.  On closure, flow was established      first into the external, then into the internal carotid artery.  Excellent      flow was noted in both vessels.            Ultrasound examination of the carotid repair documented normal flow in the      internal carotid artery, and a hard copy was submitted for the patient's      record.            Protamine 30 mg of protamine were administered and complete hemostasis in      the neck wound was noted.            A drain was left in the deep tissues of the neck, brought out through a  separate stab wound.  Deep tissues were closed with 2-0 Vicryl, platysma      with 3-0 Vicryl, skin with 4-0 Monocryl subcuticular sutures, and      Steri-Strips.  Sterile dressings were placed.  The operation was ended.                                   Page 2 of 3      George, King      MRN:          [id number]      Account:      [id number]      Document ID:  [id number]      Procedure Date: 11/25/2009            Blood loss insignificant.  All sponge and needle counts were correct.  The      patient taken back to the recovery room in good condition.                        Electronic Signing Provider            D:  11/25/2009 09:50 AM by Dr. Sandrea Hughs, MD ([id number])      T:  11/25/2009 16:42 PM by [id number]                  [email or web address] Marcy Panning MD                                   Page 3 of 3      Authenticated by Sandrea Hughs, MD On 11/28/2009 08:58:55 AM

## 2011-07-02 ENCOUNTER — Ambulatory Visit
Admit: 2011-07-02 | Discharge: 2011-07-02 | Disposition: A | Discharge status: Home / Self Care | Payer: Self-pay | Source: Ambulatory Visit

## 2011-08-06 DIAGNOSIS — E785 Hyperlipidemia, unspecified: Secondary | ICD-10-CM | POA: Diagnosis not present

## 2011-08-06 DIAGNOSIS — H259 Unspecified age-related cataract: Secondary | ICD-10-CM | POA: Diagnosis not present

## 2011-08-06 DIAGNOSIS — I10 Essential (primary) hypertension: Secondary | ICD-10-CM | POA: Diagnosis not present

## 2011-08-06 DIAGNOSIS — Z01818 Encounter for other preprocedural examination: Secondary | ICD-10-CM | POA: Diagnosis not present

## 2011-08-07 ENCOUNTER — Ambulatory Visit
Admit: 2011-08-07 | Discharge: 2011-08-07 | Disposition: A | Discharge status: Home / Self Care | Payer: Self-pay | Source: Ambulatory Visit

## 2011-08-07 DIAGNOSIS — D509 Iron deficiency anemia, unspecified: Secondary | ICD-10-CM | POA: Diagnosis not present

## 2011-08-07 LAB — CBC AND DIFFERENTIAL
Basophils Absolute Automated: 0.07 10*3/uL (ref 0.00–0.20)
Basophils Automated: 1 % (ref 0–2)
Eosinophils Absolute Automated: 0.32 10*3/uL (ref 0.00–0.70)
Eosinophils Automated: 5 % (ref 0–5)
Hematocrit: 46.9 % (ref 42.0–52.0)
Hgb: 15.5 g/dL (ref 13.0–17.0)
Immature Granulocytes Absolute: 0.02 10*3/uL
Immature Granulocytes: 0 % (ref 0–1)
Lymphocytes Absolute Automated: 1.89 10*3/uL (ref 0.50–4.40)
Lymphocytes Automated: 31 % (ref 15–41)
MCH: 31.6 pg (ref 28.0–32.0)
MCHC: 33 g/dL (ref 32.0–36.0)
MCV: 95.5 fL (ref 80.0–100.0)
MPV: 10.5 fL (ref 9.4–12.3)
Monocytes Absolute Automated: 0.69 10*3/uL (ref 0.00–1.20)
Monocytes: 11 % (ref 0–11)
Neutrophils Absolute: 3.09 10*3/uL (ref 1.80–8.10)
Neutrophils: 51 % — ABNORMAL LOW (ref 52–75)
Nucleated RBC: 0 /100 WBC
Platelets: 198 10*3/uL (ref 140–400)
RBC: 4.91 10*6/uL (ref 4.70–6.00)
RDW: 13 % (ref 12–15)
WBC: 6.08 10*3/uL (ref 3.50–10.80)

## 2011-08-07 LAB — HEMOLYSIS INDEX: Hemolysis Index: 6 Index (ref 0–9)

## 2011-08-07 LAB — COMPREHENSIVE METABOLIC PANEL
ALT: 18 U/L (ref 0–55)
AST (SGOT): 21 U/L (ref 5–34)
Albumin/Globulin Ratio: 1.3 (ref 0.9–2.2)
Albumin: 4 g/dL (ref 3.5–5.0)
Alkaline Phosphatase: 71 U/L (ref 40–150)
BUN: 19 mg/dL (ref 8.0–20.0)
Bilirubin, Total: 0.8 mg/dL (ref 0.1–1.2)
CO2: 28 mEq/L (ref 21–30)
Calcium: 10.1 mg/dL (ref 7.9–10.6)
Chloride: 101 mEq/L (ref 96–109)
Creatinine: 1.1 mg/dL (ref 0.5–1.5)
Globulin: 3.1 g/dL (ref 2.0–3.7)
Glucose: 139 mg/dL — ABNORMAL HIGH (ref 70–100)
Potassium: 4.2 mEq/L (ref 3.5–5.3)
Protein, Total: 7.1 g/dL (ref 6.0–8.3)
Sodium: 140 mEq/L (ref 135–146)

## 2011-08-07 LAB — GFR: EGFR: >60

## 2011-08-18 DIAGNOSIS — H2181 Floppy iris syndrome: Secondary | ICD-10-CM | POA: Diagnosis not present

## 2011-08-18 DIAGNOSIS — H5703 Miosis: Secondary | ICD-10-CM | POA: Diagnosis not present

## 2011-08-18 DIAGNOSIS — H2589 Other age-related cataract: Secondary | ICD-10-CM | POA: Diagnosis not present

## 2011-08-18 DIAGNOSIS — IMO0002 Reserved for concepts with insufficient information to code with codable children: Secondary | ICD-10-CM | POA: Diagnosis not present

## 2011-08-23 DIAGNOSIS — H02059 Trichiasis without entropian unspecified eye, unspecified eyelid: Secondary | ICD-10-CM | POA: Diagnosis not present

## 2011-09-21 ENCOUNTER — Ambulatory Visit
Admit: 2011-09-21 | Discharge: 2011-09-21 | Disposition: A | Discharge status: Home / Self Care | Payer: Self-pay | Source: Ambulatory Visit

## 2011-09-21 DIAGNOSIS — E119 Type 2 diabetes mellitus without complications: Secondary | ICD-10-CM | POA: Diagnosis not present

## 2011-09-21 DIAGNOSIS — E039 Hypothyroidism, unspecified: Secondary | ICD-10-CM | POA: Diagnosis not present

## 2011-09-21 DIAGNOSIS — D509 Iron deficiency anemia, unspecified: Secondary | ICD-10-CM | POA: Diagnosis not present

## 2011-09-21 DIAGNOSIS — E785 Hyperlipidemia, unspecified: Secondary | ICD-10-CM | POA: Diagnosis not present

## 2011-09-21 LAB — URINALYSIS, REFLEX TO MICROSCOPIC EXAM IF INDICATED
Bilirubin, UA: NEGATIVE
Blood, UA: NEGATIVE
Glucose, UA: NEGATIVE
Ketones UA: NEGATIVE
Leukocyte Esterase, UA: NEGATIVE
Nitrite, UA: NEGATIVE
Protein, UR: NEGATIVE
Specific Gravity UA POCT: 1.017 (ref 1.001–1.035)
Urine pH: 6 (ref 5.0–8.0)
Urobilinogen, UA: 0.2 EU/dL

## 2011-09-21 LAB — HEMOLYSIS INDEX: Hemolysis Index: 18 Index — ABNORMAL HIGH (ref 0–9)

## 2011-09-21 LAB — CBC AND DIFFERENTIAL
Basophils Absolute Automated: 0.05 10*3/uL (ref 0.00–0.20)
Basophils Automated: 1 % (ref 0–2)
Eosinophils Absolute Automated: 0.32 10*3/uL (ref 0.00–0.70)
Eosinophils Automated: 6 % — ABNORMAL HIGH (ref 0–5)
Hematocrit: 46.4 % (ref 42.0–52.0)
Hgb: 15.1 g/dL (ref 13.0–17.0)
Immature Granulocytes Absolute: 0.01 10*3/uL
Immature Granulocytes: 0 % (ref 0–1)
Lymphocytes Absolute Automated: 1.55 10*3/uL (ref 0.50–4.40)
Lymphocytes Automated: 31 % (ref 15–41)
MCH: 30.8 pg (ref 28.0–32.0)
MCHC: 32.5 g/dL (ref 32.0–36.0)
MCV: 94.7 fL (ref 80.0–100.0)
MPV: 10.6 fL (ref 9.4–12.3)
Monocytes Absolute Automated: 0.39 10*3/uL (ref 0.00–1.20)
Monocytes: 8 % (ref 0–11)
Neutrophils Absolute: 2.74 10*3/uL (ref 1.80–8.10)
Neutrophils: 54 % (ref 52–75)
Nucleated RBC: 0 /100 WBC
Platelets: 173 10*3/uL (ref 140–400)
RBC: 4.9 10*6/uL (ref 4.70–6.00)
RDW: 13 % (ref 12–15)
WBC: 5.05 10*3/uL (ref 3.50–10.80)

## 2011-09-21 LAB — COMPREHENSIVE METABOLIC PANEL
ALT: 22 U/L (ref 0–55)
AST (SGOT): 25 U/L (ref 5–34)
Albumin/Globulin Ratio: 1.2 (ref 0.9–2.2)
Albumin: 3.9 g/dL (ref 3.5–5.0)
Alkaline Phosphatase: 66 U/L (ref 40–150)
BUN: 16 mg/dL (ref 8.0–20.0)
Bilirubin, Total: 0.9 mg/dL (ref 0.1–1.2)
CO2: 25 mEq/L (ref 21–30)
Calcium: 9.8 mg/dL (ref 7.9–10.6)
Chloride: 107 mEq/L (ref 96–109)
Creatinine: 0.9 mg/dL (ref 0.5–1.5)
Globulin: 3.2 g/dL (ref 2.0–3.7)
Glucose: 125 mg/dL — ABNORMAL HIGH (ref 70–100)
Potassium: 3.9 mEq/L (ref 3.5–5.3)
Protein, Total: 7.1 g/dL (ref 6.0–8.3)
Sodium: 142 mEq/L (ref 135–146)

## 2011-09-21 LAB — HEMOGLOBIN A1C: Hemoglobin A1C: 5.3 % (ref 0.0–6.0)

## 2011-09-21 LAB — PSA: Prostate Specific Antigen, Total: 0.463 ng/mL (ref 0.000–4.000)

## 2011-09-21 LAB — LIPID PANEL
Cholesterol / HDL Ratio: 2.2 Index
Cholesterol: 127 mg/dL (ref 0–199)
HDL: 59 mg/dL (ref >40)
LDL Calculated: 54 mg/dL (ref 0–99)
Triglycerides: 72 mg/dL (ref 34–149)
VLDL Calculated: 14 mg/dL (ref 10–40)

## 2011-09-21 LAB — GFR: EGFR: >60

## 2011-09-21 LAB — VITAMIN B12: Vitamin B-12: 552 pg/mL (ref 211–911)

## 2011-09-21 LAB — TSH: TSH: 1.69 u[IU]/mL (ref 0.35–4.94)

## 2011-09-21 LAB — HOMOCYSTEINE, SERUM: HOMOCYSTEINE: 8.55 umol/L (ref 5.08–15.39)

## 2011-09-22 LAB — VITAMIN D-25 HYDROXY (D2/D3/TOTAL)
Vitamin D 25-OH D2: <4 ng/mL
Vitamin D 25-OH D3: 31 ng/mL
Vitamin D 25-OH Total: 31 ng/mL (ref 30–100)

## 2011-09-28 DIAGNOSIS — Z23 Encounter for immunization: Secondary | ICD-10-CM | POA: Diagnosis not present

## 2011-09-28 DIAGNOSIS — E785 Hyperlipidemia, unspecified: Secondary | ICD-10-CM | POA: Diagnosis not present

## 2011-09-28 DIAGNOSIS — I10 Essential (primary) hypertension: Secondary | ICD-10-CM | POA: Diagnosis not present

## 2011-09-28 DIAGNOSIS — Z1211 Encounter for screening for malignant neoplasm of colon: Secondary | ICD-10-CM | POA: Diagnosis not present

## 2011-09-28 DIAGNOSIS — E8881 Metabolic syndrome: Secondary | ICD-10-CM | POA: Diagnosis not present

## 2011-10-20 DIAGNOSIS — R351 Nocturia: Secondary | ICD-10-CM | POA: Diagnosis not present

## 2011-10-20 DIAGNOSIS — N401 Enlarged prostate with lower urinary tract symptoms: Secondary | ICD-10-CM | POA: Diagnosis not present

## 2011-11-01 DIAGNOSIS — H02059 Trichiasis without entropian unspecified eye, unspecified eyelid: Secondary | ICD-10-CM | POA: Diagnosis not present

## 2012-01-17 DIAGNOSIS — H04129 Dry eye syndrome of unspecified lacrimal gland: Secondary | ICD-10-CM | POA: Diagnosis not present

## 2012-01-17 DIAGNOSIS — H35379 Puckering of macula, unspecified eye: Secondary | ICD-10-CM | POA: Diagnosis not present

## 2012-01-17 DIAGNOSIS — H01009 Unspecified blepharitis unspecified eye, unspecified eyelid: Secondary | ICD-10-CM | POA: Diagnosis not present

## 2012-01-17 DIAGNOSIS — Z961 Presence of intraocular lens: Secondary | ICD-10-CM | POA: Diagnosis not present

## 2012-03-21 ENCOUNTER — Ambulatory Visit
Admission: RE | Admit: 2012-03-21 | Discharge: 2012-03-21 | Disposition: A | Discharge status: Home / Self Care | Payer: Medicare Other | Source: Ambulatory Visit | Attending: Internal Medicine | Admitting: Internal Medicine

## 2012-03-21 DIAGNOSIS — E785 Hyperlipidemia, unspecified: Secondary | ICD-10-CM | POA: Diagnosis not present

## 2012-03-21 DIAGNOSIS — E559 Vitamin D deficiency, unspecified: Secondary | ICD-10-CM | POA: Diagnosis not present

## 2012-03-21 DIAGNOSIS — E039 Hypothyroidism, unspecified: Secondary | ICD-10-CM | POA: Diagnosis not present

## 2012-03-21 DIAGNOSIS — R350 Frequency of micturition: Secondary | ICD-10-CM | POA: Diagnosis not present

## 2012-03-21 DIAGNOSIS — E538 Deficiency of other specified B group vitamins: Secondary | ICD-10-CM | POA: Diagnosis not present

## 2012-03-21 DIAGNOSIS — E119 Type 2 diabetes mellitus without complications: Secondary | ICD-10-CM | POA: Diagnosis not present

## 2012-03-21 DIAGNOSIS — D509 Iron deficiency anemia, unspecified: Secondary | ICD-10-CM | POA: Diagnosis not present

## 2012-03-21 DIAGNOSIS — R7401 Elevation of levels of liver transaminase levels: Secondary | ICD-10-CM | POA: Insufficient documentation

## 2012-03-21 DIAGNOSIS — R7402 Elevation of levels of lactic acid dehydrogenase (LDH): Secondary | ICD-10-CM | POA: Insufficient documentation

## 2012-03-21 LAB — COMPREHENSIVE METABOLIC PANEL
ALT: 22 U/L (ref 0–55)
AST (SGOT): 25 U/L (ref 5–34)
Albumin/Globulin Ratio: 1.3 (ref 0.9–2.2)
Albumin: 4 g/dL (ref 3.5–5.0)
Alkaline Phosphatase: 73 U/L (ref 40–150)
BUN: 17 mg/dL (ref 8.0–20.0)
Bilirubin, Total: 0.9 mg/dL (ref 0.1–1.2)
CO2: 22 mEq/L (ref 21–30)
Calcium: 10 mg/dL (ref 7.9–10.6)
Chloride: 108 mEq/L (ref 96–109)
Creatinine: 0.9 mg/dL (ref 0.5–1.5)
Globulin: 3.2 g/dL (ref 2.0–3.7)
Glucose: 120 mg/dL — ABNORMAL HIGH (ref 70–100)
Potassium: 4.3 mEq/L (ref 3.5–5.3)
Protein, Total: 7.2 g/dL (ref 6.0–8.3)
Sodium: 140 mEq/L (ref 135–146)

## 2012-03-21 LAB — URINALYSIS WITH MICROSCOPIC
Bilirubin, UA: NEGATIVE
Blood, UA: NEGATIVE
Glucose, UA: NEGATIVE
Ketones UA: NEGATIVE
Leukocyte Esterase, UA: NEGATIVE
Nitrite, UA: NEGATIVE
Protein, UR: NEGATIVE
Specific Gravity UA: 1.024 (ref 1.001–1.035)
Urine pH: 5.5 (ref 5.0–8.0)
Urobilinogen, UA: 0.2 EU/dL

## 2012-03-21 LAB — LIPID PANEL
Cholesterol / HDL Ratio: 2.4 Index
Cholesterol: 131 mg/dL (ref 0–199)
HDL: 55 mg/dL (ref >40)
LDL Calculated: 63 mg/dL (ref 0–99)
Triglycerides: 67 mg/dL (ref 34–149)
VLDL Calculated: 13 mg/dL (ref 10–40)

## 2012-03-21 LAB — CBC AND DIFFERENTIAL
Basophils Absolute Automated: 0.07 10*3/uL (ref 0.00–0.20)
Basophils Automated: 1 % (ref 0–2)
Eosinophils Absolute Automated: 0.45 10*3/uL (ref 0.00–0.70)
Eosinophils Automated: 8 % — ABNORMAL HIGH (ref 0–5)
Hematocrit: 47 % (ref 42.0–52.0)
Hgb: 15.7 g/dL (ref 13.0–17.0)
Immature Granulocytes Absolute: 0.02 10*3/uL
Immature Granulocytes: 0 % (ref 0–1)
Lymphocytes Absolute Automated: 2.07 10*3/uL (ref 0.50–4.40)
Lymphocytes Automated: 35 % (ref 15–41)
MCH: 31.3 pg (ref 28.0–32.0)
MCHC: 33.4 g/dL (ref 32.0–36.0)
MCV: 93.8 fL (ref 80.0–100.0)
MPV: 11 fL (ref 9.4–12.3)
Monocytes Absolute Automated: 0.58 10*3/uL (ref 0.00–1.20)
Monocytes: 10 % (ref 0–11)
Neutrophils Absolute: 2.74 10*3/uL (ref 1.80–8.10)
Neutrophils: 46 % — ABNORMAL LOW (ref 52–75)
Nucleated RBC: 0 /100 WBC (ref 0–1)
Platelets: 174 10*3/uL (ref 140–400)
RBC: 5.01 10*6/uL (ref 4.70–6.00)
RDW: 13 % (ref 12–15)
WBC: 5.93 10*3/uL (ref 3.50–10.80)

## 2012-03-21 LAB — PROSTATE SPECIFIC ANTIGEN FREE/TOTA (SOFT)
PSA Free and Total Ratio: 0.365
PSA, Free: 0.129 ng/mL (ref 0.000–0.500)
Prostate Specific Antigen, Total: 0.353 ng/mL (ref 0.000–4.000)

## 2012-03-21 LAB — GFR: EGFR: >60

## 2012-03-21 LAB — FOLATE: Folate: >24 ng/mL

## 2012-03-21 LAB — HEMOLYSIS INDEX: Hemolysis Index: 24 Index — ABNORMAL HIGH (ref 0–9)

## 2012-03-21 LAB — VITAMIN D,25 OH,TOTAL: Vitamin D, 25 OH, Total: 42 ng/mL (ref 30–100)

## 2012-03-21 LAB — HOMOCYSTEINE, SERUM: HOMOCYSTEINE: 9.93 umol/L (ref 5.08–15.39)

## 2012-03-21 LAB — VITAMIN B12: Vitamin B-12: 983 pg/mL — ABNORMAL HIGH (ref 211–911)

## 2012-03-21 LAB — TSH: TSH: 1.52 u[IU]/mL (ref 0.35–4.94)

## 2012-03-28 DIAGNOSIS — E785 Hyperlipidemia, unspecified: Secondary | ICD-10-CM | POA: Diagnosis not present

## 2012-03-28 DIAGNOSIS — M1710 Unilateral primary osteoarthritis, unspecified knee: Secondary | ICD-10-CM | POA: Diagnosis not present

## 2012-03-28 DIAGNOSIS — I6529 Occlusion and stenosis of unspecified carotid artery: Secondary | ICD-10-CM | POA: Diagnosis not present

## 2012-03-28 DIAGNOSIS — I10 Essential (primary) hypertension: Secondary | ICD-10-CM | POA: Diagnosis not present

## 2012-05-01 DIAGNOSIS — Z23 Encounter for immunization: Secondary | ICD-10-CM | POA: Diagnosis not present

## 2012-07-04 DIAGNOSIS — H6120 Impacted cerumen, unspecified ear: Secondary | ICD-10-CM | POA: Diagnosis not present

## 2012-07-04 DIAGNOSIS — J338 Other polyp of sinus: Secondary | ICD-10-CM | POA: Diagnosis not present

## 2012-07-04 DIAGNOSIS — J330 Polyp of nasal cavity: Secondary | ICD-10-CM | POA: Diagnosis not present

## 2012-09-21 ENCOUNTER — Ambulatory Visit
Admission: RE | Admit: 2012-09-21 | Discharge: 2012-09-21 | Disposition: A | Discharge status: Home / Self Care | Payer: Medicare Other | Source: Ambulatory Visit | Attending: Internal Medicine | Admitting: Internal Medicine

## 2012-09-21 DIAGNOSIS — R7402 Elevation of levels of lactic acid dehydrogenase (LDH): Secondary | ICD-10-CM | POA: Insufficient documentation

## 2012-09-21 DIAGNOSIS — R350 Frequency of micturition: Secondary | ICD-10-CM | POA: Insufficient documentation

## 2012-09-21 DIAGNOSIS — E538 Deficiency of other specified B group vitamins: Secondary | ICD-10-CM | POA: Insufficient documentation

## 2012-09-21 DIAGNOSIS — D509 Iron deficiency anemia, unspecified: Secondary | ICD-10-CM | POA: Insufficient documentation

## 2012-09-21 DIAGNOSIS — E039 Hypothyroidism, unspecified: Secondary | ICD-10-CM | POA: Insufficient documentation

## 2012-09-21 DIAGNOSIS — R7401 Elevation of levels of liver transaminase levels: Secondary | ICD-10-CM | POA: Insufficient documentation

## 2012-09-21 DIAGNOSIS — E785 Hyperlipidemia, unspecified: Secondary | ICD-10-CM | POA: Insufficient documentation

## 2012-09-21 DIAGNOSIS — E559 Vitamin D deficiency, unspecified: Secondary | ICD-10-CM | POA: Insufficient documentation

## 2012-09-21 DIAGNOSIS — E119 Type 2 diabetes mellitus without complications: Secondary | ICD-10-CM | POA: Insufficient documentation

## 2012-09-21 LAB — PROSTATE SPECIFIC ANTIGEN FREE/TOTA (SOFT)
PSA Free and Total Ratio: 0.333
PSA, Free: 0.071 ng/mL (ref 0.000–0.500)
Prostate Specific Antigen, Total: 0.213 ng/mL (ref 0.000–4.000)

## 2012-09-21 LAB — HOMOCYSTEINE, SERUM: HOMOCYSTEINE: 9.29 umol/L (ref 5.08–15.39)

## 2012-09-21 LAB — COMPREHENSIVE METABOLIC PANEL
ALT: 22 U/L (ref 0–55)
AST (SGOT): 25 U/L (ref 5–34)
Albumin/Globulin Ratio: 1.1 (ref 0.9–2.2)
Albumin: 3.7 g/dL (ref 3.5–5.0)
Alkaline Phosphatase: 73 U/L (ref 40–150)
BUN: 21 mg/dL — ABNORMAL HIGH (ref 8.0–20.0)
Bilirubin, Total: 1.2 mg/dL (ref 0.1–1.2)
CO2: 25 mEq/L (ref 21–30)
Calcium: 9.5 mg/dL (ref 7.9–10.6)
Chloride: 106 mEq/L (ref 96–109)
Creatinine: 1 mg/dL (ref 0.5–1.5)
Globulin: 3.3 g/dL (ref 2.0–3.7)
Glucose: 134 mg/dL — ABNORMAL HIGH (ref 70–100)
Potassium: 4.2 mEq/L (ref 3.5–5.3)
Protein, Total: 7 g/dL (ref 6.0–8.3)
Sodium: 141 mEq/L (ref 135–146)

## 2012-09-21 LAB — LIPID PANEL
Cholesterol / HDL Ratio: 2.4 Index
Cholesterol: 134 mg/dL (ref 0–199)
HDL: 57 mg/dL (ref >40)
LDL Calculated: 63 mg/dL (ref 0–99)
Triglycerides: 69 mg/dL (ref 34–149)
VLDL Calculated: 14 mg/dL (ref 10–40)

## 2012-09-21 LAB — CBC AND DIFFERENTIAL
Basophils Absolute Automated: 0.08 10*3/uL (ref 0.00–0.20)
Basophils Automated: 2 %
Eosinophils Absolute Automated: 0.35 10*3/uL (ref 0.00–0.70)
Eosinophils Automated: 7 %
Hematocrit: 48.3 % (ref 42.0–52.0)
Hgb: 15.6 g/dL (ref 13.0–17.0)
Immature Granulocytes Absolute: 0.01 10*3/uL
Immature Granulocytes: 0 %
Lymphocytes Absolute Automated: 1.65 10*3/uL (ref 0.50–4.40)
Lymphocytes Automated: 34 %
MCH: 31.3 pg (ref 28.0–32.0)
MCHC: 32.3 g/dL (ref 32.0–36.0)
MCV: 96.8 fL (ref 80.0–100.0)
MPV: 11 fL (ref 9.4–12.3)
Monocytes Absolute Automated: 0.43 10*3/uL (ref 0.00–1.20)
Monocytes: 9 %
Neutrophils Absolute: 2.26 10*3/uL (ref 1.80–8.10)
Neutrophils: 47 %
Nucleated RBC: 0 /100 WBC (ref 0–1)
Platelets: 176 10*3/uL (ref 140–400)
RBC: 4.99 10*6/uL (ref 4.70–6.00)
RDW: 13 % (ref 12–15)
WBC: 4.78 10*3/uL (ref 3.50–10.80)

## 2012-09-21 LAB — URINALYSIS WITH MICROSCOPIC
Bilirubin, UA: NEGATIVE
Blood, UA: NEGATIVE
Glucose, UA: NEGATIVE
Ketones UA: NEGATIVE
Leukocyte Esterase, UA: NEGATIVE
Nitrite, UA: NEGATIVE
Protein, UR: 30 — AB
Specific Gravity UA: 1.023 (ref 1.001–1.035)
Urine pH: 6 (ref 5.0–8.0)
Urobilinogen, UA: 0.2 EU/dL

## 2012-09-21 LAB — HEMOLYSIS INDEX: Hemolysis Index: 6 Index (ref 0–9)

## 2012-09-21 LAB — GFR: EGFR: >60

## 2012-09-21 LAB — HEMOGLOBIN A1C: Hemoglobin A1C: 5.5 % (ref 0.0–6.0)

## 2012-09-21 LAB — FOLATE: Folate: 14.9 ng/mL

## 2012-09-21 LAB — VITAMIN D,25 OH,TOTAL: Vitamin D, 25 OH, Total: 41 ng/mL (ref 30–100)

## 2012-09-21 LAB — VITAMIN B12: Vitamin B-12: 492 pg/mL (ref 211–911)

## 2012-09-21 LAB — TSH: TSH: 1.52 u[IU]/mL (ref 0.35–4.94)

## 2012-09-26 DIAGNOSIS — E785 Hyperlipidemia, unspecified: Secondary | ICD-10-CM | POA: Diagnosis not present

## 2012-09-26 DIAGNOSIS — Z1211 Encounter for screening for malignant neoplasm of colon: Secondary | ICD-10-CM | POA: Diagnosis not present

## 2012-09-26 DIAGNOSIS — I10 Essential (primary) hypertension: Secondary | ICD-10-CM | POA: Diagnosis not present

## 2013-01-15 DIAGNOSIS — M1710 Unilateral primary osteoarthritis, unspecified knee: Secondary | ICD-10-CM | POA: Diagnosis not present

## 2013-03-06 ENCOUNTER — Telehealth: Payer: BC Managed Care – PPO

## 2013-03-06 ENCOUNTER — Ambulatory Visit: Payer: Medicare Other

## 2013-03-06 NOTE — Pre-Procedure Instructions (Signed)
H&p,labs,ekg requested from Dr. Alycia Rossetti [phone]  Cefazolin  No preop blood bank orders selected from order set  Scheduled clinic appt 03/30/13 no MD

## 2013-03-15 ENCOUNTER — Ambulatory Visit
Admission: RE | Admit: 2013-03-15 | Discharge: 2013-03-15 | Disposition: A | Discharge status: Home / Self Care | Payer: Medicare Other | Source: Ambulatory Visit | Attending: Internal Medicine | Admitting: Internal Medicine

## 2013-03-15 DIAGNOSIS — M1710 Unilateral primary osteoarthritis, unspecified knee: Secondary | ICD-10-CM | POA: Diagnosis not present

## 2013-03-15 DIAGNOSIS — Z01818 Encounter for other preprocedural examination: Secondary | ICD-10-CM | POA: Diagnosis not present

## 2013-03-15 LAB — CBC AND DIFFERENTIAL
Basophils Absolute Automated: 0.05 (ref 0.00–0.20)
Basophils Automated: 1 %
Eosinophils Absolute Automated: 0.38 (ref 0.00–0.70)
Eosinophils Automated: 7 %
Hematocrit: 44.9 % (ref 42.0–52.0)
Hgb: 14.9 g/dL (ref 13.0–17.0)
Immature Granulocytes Absolute: 0.01
Immature Granulocytes: 0 %
Lymphocytes Absolute Automated: 1.63 (ref 0.50–4.40)
Lymphocytes Automated: 31 %
MCH: 31.3 pg (ref 28.0–32.0)
MCHC: 33.2 g/dL (ref 32.0–36.0)
MCV: 94.3 fL (ref 80.0–100.0)
MPV: 11 fL (ref 9.4–12.3)
Monocytes Absolute Automated: 0.46 (ref 0.00–1.20)
Monocytes: 9 %
Neutrophils Absolute: 2.8 (ref 1.80–8.10)
Neutrophils: 53 %
Nucleated RBC: 0 (ref 0–1)
Platelets: 184 (ref 140–400)
RBC: 4.76 (ref 4.70–6.00)
RDW: 13 % (ref 12–15)
WBC: 5.32 (ref 3.50–10.80)

## 2013-03-15 LAB — PT AND APTT
PT INR: 1.2 — ABNORMAL HIGH (ref 0.9–1.1)
PT: 14.9 (ref 12.6–15.0)
PTT: 36 (ref 23–37)

## 2013-03-15 LAB — COMPREHENSIVE METABOLIC PANEL
ALT: 20 U/L (ref 0–55)
AST (SGOT): 23 U/L (ref 5–34)
Albumin/Globulin Ratio: 1.1 (ref 0.9–2.2)
Albumin: 3.6 g/dL (ref 3.5–5.0)
Alkaline Phosphatase: 61 U/L (ref 40–150)
BUN: 18 mg/dL (ref 8.0–20.0)
Bilirubin, Total: 1.1 mg/dL (ref 0.1–1.2)
CO2: 23 (ref 21–30)
Calcium: 9.6 mg/dL (ref 7.9–10.6)
Chloride: 108 (ref 96–109)
Creatinine: 1 mg/dL (ref 0.5–1.5)
Globulin: 3.2 g/dL (ref 2.0–3.7)
Glucose: 124 mg/dL — ABNORMAL HIGH (ref 70–100)
Potassium: 4.1 (ref 3.5–5.3)
Protein, Total: 6.8 g/dL (ref 6.0–8.3)
Sodium: 141 (ref 135–146)

## 2013-03-15 LAB — URINALYSIS, REFLEX TO MICROSCOPIC EXAM IF INDICATED
Bilirubin, UA: NEGATIVE
Blood, UA: NEGATIVE
Glucose, UA: NEGATIVE
Leukocyte Esterase, UA: NEGATIVE
Nitrite, UA: NEGATIVE
Protein, UR: NEGATIVE
Specific Gravity UA: 1.022 (ref 1.001–1.035)
Urine pH: 6 (ref 5.0–8.0)
Urobilinogen, UA: 0.2

## 2013-03-15 LAB — GFR: EGFR: >60

## 2013-03-15 LAB — C-REACTIVE PROTEIN: C-Reactive Protein: <0.5 mg/dL (ref 0.0–0.8)

## 2013-03-15 LAB — HEMOLYSIS INDEX: Hemolysis Index: 11 — ABNORMAL HIGH (ref 0–9)

## 2013-03-15 LAB — SEDIMENTATION RATE: Sed Rate: 6 (ref 0–10)

## 2013-03-26 DIAGNOSIS — Z01818 Encounter for other preprocedural examination: Secondary | ICD-10-CM | POA: Diagnosis not present

## 2013-03-26 DIAGNOSIS — I10 Essential (primary) hypertension: Secondary | ICD-10-CM | POA: Diagnosis not present

## 2013-03-26 DIAGNOSIS — M1710 Unilateral primary osteoarthritis, unspecified knee: Secondary | ICD-10-CM | POA: Diagnosis not present

## 2013-03-26 DIAGNOSIS — E785 Hyperlipidemia, unspecified: Secondary | ICD-10-CM | POA: Diagnosis not present

## 2013-03-30 ENCOUNTER — Ambulatory Visit: Payer: Medicare Other | Attending: Adult Reconstructive Orthopaedic Surgery

## 2013-03-30 DIAGNOSIS — Z01818 Encounter for other preprocedural examination: Secondary | ICD-10-CM | POA: Diagnosis not present

## 2013-03-30 DIAGNOSIS — M1710 Unilateral primary osteoarthritis, unspecified knee: Secondary | ICD-10-CM | POA: Diagnosis not present

## 2013-03-30 LAB — ABO/RH: ABO Rh: O NEG

## 2013-03-30 LAB — ANTIBODY SCREEN: AB Screen Gel: NEGATIVE

## 2013-03-30 NOTE — PT/OT Therapy Note (Signed)
[facility]  [address]  [place] [address]  [phone]    Physical Therapy Pre-Operative Intake Form    Patient: George King MRN: [id number]   Unit: [facility]     Precautions  Total Knee Replacement:  (Left Kee Replacement by Dr. Deloria Lair on 04/10/13.)  Pain in knee x 5-6 years.  Social History   Prior Level of Function  Prior level of function: Ambulates / Performs ADL's independently  Baseline Activity Level: Community ambulation  DME Currently at Home: Single point cane;Crutches;Standard walker    Home Living Arrangements  Living Arrangements: Alone  Type of Home: House  Home Layout: One level;Laundry in basement;Stairs to enter without rails (add number in comment) (3  steps w/o railing, holds on to door/ door frame.)  Bathroom Shower/Tub: Tub/shower unit  Bathroom Toilet: Standard  Bathroom Equipment: Grab bars in shower;Shower chair;Hand-held shower  Bathroom Accessibility: Accessible  DME Currently at Home: Single point cane;Crutches;Standard walker    Subjective   : Patient is agreeable to participation in the therapy session.  Patient Goal  Patient Goal:  (to be painfree, walk w/o pain.)    Pain Assessment  Patient's Stated Comfort Functional Goal: 2-mild pain     Functional Limits  Pain Level Best: 2  Pain Level Worst:  (nagging all the time.)    Observation of patient:                                                                        Reviewed pre-operative exercises provided by MD office, post-operative therapy plans.      Plan      Initiate post-op Physical Therapy Evaluation per post-op MD orders.     Patient wants to go home after surgery.  Signature: Louann Liv, PTA 03/30/2013 10:16 AM

## 2013-04-02 ENCOUNTER — Inpatient Hospital Stay
Admission: RE | Admit: 2013-04-02 | Discharge: 2013-04-02 | Disposition: A | Discharge status: Home / Self Care | Payer: Medicare Other | Source: Ambulatory Visit | Attending: Internal Medicine | Admitting: Internal Medicine

## 2013-04-02 DIAGNOSIS — Z1611 Resistance to penicillins: Secondary | ICD-10-CM | POA: Diagnosis not present

## 2013-04-04 ENCOUNTER — Inpatient Hospital Stay
Admission: RE | Admit: 2013-04-04 | Discharge: 2013-04-04 | Disposition: A | Discharge status: Home / Self Care | Payer: Medicare Other | Source: Ambulatory Visit

## 2013-04-04 DIAGNOSIS — Z1611 Resistance to penicillins: Secondary | ICD-10-CM | POA: Diagnosis not present

## 2013-04-05 ENCOUNTER — Ambulatory Visit
Admission: RE | Admit: 2013-04-05 | Discharge: 2013-04-05 | Disposition: A | Discharge status: Home / Self Care | Payer: Medicare Other | Source: Ambulatory Visit | Attending: Internal Medicine | Admitting: Internal Medicine

## 2013-04-05 DIAGNOSIS — Z1611 Resistance to penicillins: Secondary | ICD-10-CM | POA: Diagnosis not present

## 2013-04-09 NOTE — Anesthesia Preprocedure Evaluation (Addendum)
Anesthesia Evaluation    AIRWAY    Mallampati: II    TM distance: >3 FB  Neck ROM: full  Mouth Opening:full   CARDIOVASCULAR    cardiovascular exam normal       DENTAL    No notable dental hx     PULMONARY    pulmonary exam normal     OTHER FINDINGS              PSS Anesthesia Comments: EKG shows RBBB also present on 3/14 EKG- ok to proceed        Anesthesia Plan    ASA 3     CSE                     Detailed anesthesia plan: general endotracheal            informed consent obtained    ECG reviewed

## 2013-04-10 ENCOUNTER — Encounter: Payer: Self-pay | Admitting: Anesthesiology

## 2013-04-10 ENCOUNTER — Inpatient Hospital Stay
Admission: RE | Admit: 2013-04-10 | Discharge: 2013-04-12 | DRG: 470 | Disposition: A | Discharge status: Home / Self Care | Payer: Medicare Other | Source: Ambulatory Visit | Attending: Adult Reconstructive Orthopaedic Surgery | Admitting: Adult Reconstructive Orthopaedic Surgery

## 2013-04-10 ENCOUNTER — Inpatient Hospital Stay: Payer: Medicare Other

## 2013-04-10 ENCOUNTER — Inpatient Hospital Stay: Payer: Medicare Other | Admitting: Anesthesiology

## 2013-04-10 ENCOUNTER — Encounter
Admission: RE | Disposition: A | Discharge status: Home / Self Care | Payer: Self-pay | Source: Ambulatory Visit | Attending: Adult Reconstructive Orthopaedic Surgery

## 2013-04-10 ENCOUNTER — Inpatient Hospital Stay: Payer: Medicare Other | Admitting: Adult Reconstructive Orthopaedic Surgery

## 2013-04-10 DIAGNOSIS — I10 Essential (primary) hypertension: Secondary | ICD-10-CM | POA: Diagnosis present

## 2013-04-10 DIAGNOSIS — Z471 Aftercare following joint replacement surgery: Secondary | ICD-10-CM | POA: Diagnosis not present

## 2013-04-10 DIAGNOSIS — I6529 Occlusion and stenosis of unspecified carotid artery: Secondary | ICD-10-CM | POA: Diagnosis present

## 2013-04-10 DIAGNOSIS — N400 Enlarged prostate without lower urinary tract symptoms: Secondary | ICD-10-CM | POA: Diagnosis present

## 2013-04-10 DIAGNOSIS — Z96659 Presence of unspecified artificial knee joint: Secondary | ICD-10-CM | POA: Diagnosis not present

## 2013-04-10 DIAGNOSIS — M1710 Unilateral primary osteoarthritis, unspecified knee: Secondary | ICD-10-CM | POA: Diagnosis not present

## 2013-04-10 DIAGNOSIS — E785 Hyperlipidemia, unspecified: Secondary | ICD-10-CM | POA: Diagnosis present

## 2013-04-10 HISTORY — PX: ARTHROPLASTY, KNEE, TOTAL: SHX3134

## 2013-04-10 LAB — TYPE AND SCREEN
AB Screen Gel: NEGATIVE
ABO Rh: O NEG

## 2013-04-10 SURGERY — ARTHROPLASTY, KNEE, TOTAL
Anesthesia: Regional | Site: Knee | Laterality: Left | Wound class: Clean

## 2013-04-10 MED ORDER — METHYLPREDNISOLONE ACETATE 40 MG/ML IJ SUSP
INTRAMUSCULAR | Status: DC | PRN
Start: 2013-04-10 — End: 2013-04-10
  Administered 2013-04-10: 40 mg via INTRAMUSCULAR

## 2013-04-10 MED ORDER — CELECOXIB 200 MG PO CAPS
400.0000 mg | ORAL_CAPSULE | Freq: Once | ORAL | Status: AC
Start: 2013-04-10 — End: 2013-04-10
  Administered 2013-04-10: 400 mg via ORAL

## 2013-04-10 MED ORDER — CELECOXIB 200 MG PO CAPS
200.0000 mg | ORAL_CAPSULE | Freq: Two times a day (BID) | ORAL | Status: DC
Start: 2013-04-11 — End: 2013-04-12
  Administered 2013-04-11 – 2013-04-12 (×3): 200 mg via ORAL
  Filled 2013-04-10 (×3): qty 1

## 2013-04-10 MED ORDER — PROPOFOL 10 MG/ML IV EMUL
INTRAVENOUS | Status: AC
Start: 2013-04-10 — End: ?
  Filled 2013-04-10: qty 50

## 2013-04-10 MED ORDER — SODIUM CHLORIDE 0.9 % IV SOLN
INTRAVENOUS | Status: DC
Start: 2013-04-10 — End: 2013-04-10

## 2013-04-10 MED ORDER — SODIUM CHLORIDE 0.9 % IV SOLN
1000.0000 mg | Freq: Once | INTRAVENOUS | Status: AC
Start: 2013-04-10 — End: 2013-04-10
  Administered 2013-04-10: 1000 mg via INTRAVENOUS
  Filled 2013-04-10: qty 10

## 2013-04-10 MED ORDER — ONDANSETRON HCL 4 MG/2ML IJ SOLN
INTRAMUSCULAR | Status: AC
Start: 2013-04-10 — End: ?
  Filled 2013-04-10: qty 2

## 2013-04-10 MED ORDER — PROPOFOL 10 MG/ML IV EMUL
INTRAVENOUS | Status: AC
Start: 2013-04-10 — End: ?
  Filled 2013-04-10: qty 20

## 2013-04-10 MED ORDER — DEXAMETHASONE 4 MG PO TABS
2.0000 mg | ORAL_TABLET | Freq: Four times a day (QID) | ORAL | Status: AC
Start: 2013-04-10 — End: 2013-04-11
  Administered 2013-04-10 – 2013-04-11 (×4): 2 mg via ORAL
  Filled 2013-04-10 (×3): qty 1

## 2013-04-10 MED ORDER — ONDANSETRON HCL 4 MG/2ML IJ SOLN
4.0000 mg | Freq: Three times a day (TID) | INTRAMUSCULAR | Status: DC | PRN
Start: 2013-04-10 — End: 2013-04-12

## 2013-04-10 MED ORDER — KETOROLAC TROMETHAMINE 30 MG/ML IJ SOLN
INTRAMUSCULAR | Status: AC
Start: 2013-04-10 — End: ?
  Filled 2013-04-10: qty 1

## 2013-04-10 MED ORDER — METOCLOPRAMIDE HCL 5 MG/ML IJ SOLN
10.0000 mg | Freq: Once | INTRAMUSCULAR | Status: DC | PRN
Start: 2013-04-10 — End: 2013-04-10

## 2013-04-10 MED ORDER — HYDROMORPHONE HCL PF 1 MG/ML IJ SOLN
0.5000 mg | INTRAMUSCULAR | Status: DC | PRN
Start: 2013-04-10 — End: 2013-04-10
  Administered 2013-04-10: 0.5 mg via INTRAVENOUS

## 2013-04-10 MED ORDER — MIDAZOLAM HCL 2 MG/2ML IJ SOLN
INTRAMUSCULAR | Status: DC | PRN
Start: 2013-04-10 — End: 2013-04-10
  Administered 2013-04-10: 3 mg via INTRAVENOUS

## 2013-04-10 MED ORDER — DIPHENHYDRAMINE HCL 25 MG PO CAPS
25.0000 mg | ORAL_CAPSULE | Freq: Two times a day (BID) | ORAL | Status: DC | PRN
Start: 2013-04-10 — End: 2013-04-12

## 2013-04-10 MED ORDER — ACETAMINOPHEN 500 MG PO TABS
1000.0000 mg | ORAL_TABLET | Freq: Once | ORAL | Status: AC
Start: 2013-04-10 — End: 2013-04-10
  Administered 2013-04-10: 1000 mg via ORAL

## 2013-04-10 MED ORDER — ACETAMINOPHEN 500 MG PO TABS
ORAL_TABLET | ORAL | Status: AC
Start: 2013-04-10 — End: ?
  Filled 2013-04-10: qty 1

## 2013-04-10 MED ORDER — BUPIVACAINE HCL (PF) 0.5 % IJ SOLN
INTRAMUSCULAR | Status: DC | PRN
Start: 2013-04-10 — End: 2013-04-10
  Administered 2013-04-10: 12 mg via INTRATHECAL

## 2013-04-10 MED ORDER — PROPOFOL INFUSION 10 MG/ML
INTRAVENOUS | Status: DC | PRN
Start: 2013-04-10 — End: 2013-04-10
  Administered 2013-04-10: 80 ug/kg/min via INTRAVENOUS

## 2013-04-10 MED ORDER — PROPOFOL INFUSION 10 MG/ML
INTRAVENOUS | Status: DC | PRN
Start: 2013-04-10 — End: 2013-04-10
  Administered 2013-04-10: 30 mg via INTRAVENOUS

## 2013-04-10 MED ORDER — LIDOCAINE HCL 2 % EX GEL
CUTANEOUS | Status: AC
Start: 2013-04-10 — End: ?
  Filled 2013-04-10: qty 0.5

## 2013-04-10 MED ORDER — KETOROLAC TROMETHAMINE 30 MG/ML IJ SOLN
15.0000 mg | Freq: Once | INTRAMUSCULAR | Status: AC
Start: 2013-04-10 — End: 2013-04-10
  Administered 2013-04-10: 15 mg via INTRAVENOUS
  Filled 2013-04-10: qty 1

## 2013-04-10 MED ORDER — SODIUM CHLORIDE 0.9 % IR SOLN
Status: DC | PRN
Start: 2013-04-10 — End: 2013-04-10
  Administered 2013-04-10: 500 mL

## 2013-04-10 MED ORDER — EPHEDRINE SULFATE 50 MG/ML IJ SOLN
INTRAMUSCULAR | Status: DC | PRN
Start: 2013-04-10 — End: 2013-04-10
  Administered 2013-04-10: 5 mg via INTRAVENOUS

## 2013-04-10 MED ORDER — SODIUM CHLORIDE 0.9 % IR SOLN
100000.0000 [IU] | Status: DC | PRN
Start: 2013-04-10 — End: 2013-04-10
  Administered 2013-04-10: 100000 [IU]

## 2013-04-10 MED ORDER — HYDROCODONE-ACETAMINOPHEN 5-325 MG PO TABS
2.0000 | ORAL_TABLET | ORAL | Status: DC | PRN
Start: 2013-04-10 — End: 2013-04-12
  Administered 2013-04-11 (×2): 1 via ORAL
  Filled 2013-04-10 (×2): qty 1

## 2013-04-10 MED ORDER — SENNOSIDES-DOCUSATE SODIUM 8.6-50 MG PO TABS
2.0000 | ORAL_TABLET | Freq: Two times a day (BID) | ORAL | Status: DC | PRN
Start: 2013-04-10 — End: 2013-04-12

## 2013-04-10 MED ORDER — ROPIVACAINE HCL 5 MG/ML IJ SOLN
INTRAMUSCULAR | Status: DC | PRN
Start: 2013-04-10 — End: 2013-04-10
  Administered 2013-04-10: 30 mL

## 2013-04-10 MED ORDER — LIDOCAINE HCL (PF) 2 % IJ SOLN
INTRAMUSCULAR | Status: AC
Start: 2013-04-10 — End: ?
  Filled 2013-04-10: qty 5

## 2013-04-10 MED ORDER — TRAMADOL HCL 50 MG PO TABS
50.0000 mg | ORAL_TABLET | Freq: Four times a day (QID) | ORAL | Status: DC | PRN
Start: 2013-04-10 — End: 2013-04-12
  Administered 2013-04-10 (×2): 50 mg via ORAL
  Filled 2013-04-10 (×2): qty 1

## 2013-04-10 MED ORDER — EPINEPHRINE HCL 1 MG/ML IJ SOLN
INTRAMUSCULAR | Status: DC | PRN
Start: 2013-04-10 — End: 2013-04-10
  Administered 2013-04-10: .25 mg via INTRAMUSCULAR

## 2013-04-10 MED ORDER — ROPIVACAINE HCL 5 MG/ML IJ SOLN
INTRAMUSCULAR | Status: AC
Start: 2013-04-10 — End: ?
  Filled 2013-04-10: qty 30

## 2013-04-10 MED ORDER — ASPIRIN 325 MG PO TBEC
325.0000 mg | DELAYED_RELEASE_TABLET | Freq: Two times a day (BID) | ORAL | Status: DC
Start: 2013-04-10 — End: 2013-04-12
  Administered 2013-04-10 – 2013-04-12 (×4): 325 mg via ORAL
  Filled 2013-04-10 (×4): qty 1

## 2013-04-10 MED ORDER — SODIUM CHLORIDE 0.9 % IV SOLN
1000.0000 mg | Freq: Once | INTRAVENOUS | Status: AC | PRN
Start: 2013-04-10 — End: 2013-04-10
  Administered 2013-04-10: 1000 mg via INTRAVENOUS
  Filled 2013-04-10: qty 10

## 2013-04-10 MED ORDER — CEFAZOLIN SODIUM-DEXTROSE 2-3 GM-% IV SOLR
2.0000 g | Freq: Three times a day (TID) | INTRAVENOUS | Status: AC
Start: 2013-04-10 — End: 2013-04-11
  Administered 2013-04-10 – 2013-04-11 (×2): 2 g via INTRAVENOUS
  Filled 2013-04-10 (×2): qty 50

## 2013-04-10 MED ORDER — MEPERIDINE HCL 25 MG/ML IJ SOLN
25.0000 mg | INTRAMUSCULAR | Status: DC | PRN
Start: 2013-04-10 — End: 2013-04-10

## 2013-04-10 MED ORDER — SODIUM CHLORIDE 0.9 % IV SOLN
INTRAVENOUS | Status: DC
Start: 2013-04-10 — End: 2013-04-12

## 2013-04-10 MED ORDER — DOCUSATE SODIUM 100 MG PO CAPS
100.0000 mg | ORAL_CAPSULE | Freq: Two times a day (BID) | ORAL | Status: DC
Start: 2013-04-10 — End: 2013-04-12
  Administered 2013-04-10 – 2013-04-12 (×4): 100 mg via ORAL
  Filled 2013-04-10 (×4): qty 1

## 2013-04-10 MED ORDER — FENTANYL CITRATE 0.05 MG/ML IJ SOLN
INTRAMUSCULAR | Status: AC
Start: 2013-04-10 — End: ?
  Filled 2013-04-10: qty 2

## 2013-04-10 MED ORDER — MIDAZOLAM HCL 5 MG/5ML IJ SOLN
INTRAMUSCULAR | Status: AC
Start: 2013-04-10 — End: ?
  Filled 2013-04-10: qty 5

## 2013-04-10 MED ORDER — ACETAMINOPHEN 325 MG PO TABS
650.0000 mg | ORAL_TABLET | ORAL | Status: DC
Start: 2013-04-10 — End: 2013-04-10

## 2013-04-10 MED ORDER — MORPHINE SULFATE (PF) 10 MG/ML IV SOLN
INTRAVENOUS | Status: DC | PRN
Start: 2013-04-10 — End: 2013-04-10
  Administered 2013-04-10: 10 mg via INTRAMUSCULAR

## 2013-04-10 MED ORDER — MORPHINE SULFATE 10 MG/ML IJ/IV SOLN (WRAP)
Status: AC
Start: 2013-04-10 — End: ?
  Filled 2013-04-10: qty 1

## 2013-04-10 MED ORDER — TRAMADOL HCL 50 MG PO TABS
50.0000 mg | ORAL_TABLET | Freq: Four times a day (QID) | ORAL | Status: DC
Start: 2013-04-10 — End: 2013-04-12
  Administered 2013-04-10 – 2013-04-12 (×8): 50 mg via ORAL
  Filled 2013-04-10 (×7): qty 1

## 2013-04-10 MED ORDER — METHYLPREDNISOLONE ACETATE 40 MG/ML IJ SUSP
INTRAMUSCULAR | Status: AC
Start: 2013-04-10 — End: ?
  Filled 2013-04-10: qty 1

## 2013-04-10 MED ORDER — LIDOCAINE-EPINEPHRINE 2 %-1:200000 IJ SOLN
INTRAMUSCULAR | Status: AC
Start: 2013-04-10 — End: ?
  Filled 2013-04-10: qty 20

## 2013-04-10 MED ORDER — ACETAMINOPHEN 325 MG PO TABS
650.0000 mg | ORAL_TABLET | ORAL | Status: DC | PRN
Start: 2013-04-10 — End: 2013-04-12

## 2013-04-10 MED ORDER — HYDROMORPHONE HCL PF 1 MG/ML IJ SOLN
0.5000 mg | INTRAMUSCULAR | Status: DC | PRN
Start: 2013-04-10 — End: 2013-04-12

## 2013-04-10 MED ORDER — FENTANYL CITRATE 0.05 MG/ML IJ SOLN
25.0000 ug | INTRAMUSCULAR | Status: DC | PRN
Start: 2013-04-10 — End: 2013-04-10

## 2013-04-10 MED ORDER — VITAMIN C 500 MG PO TABS
500.0000 mg | ORAL_TABLET | Freq: Every morning | ORAL | Status: DC
Start: 2013-04-11 — End: 2013-04-12
  Administered 2013-04-11 – 2013-04-12 (×2): 500 mg via ORAL
  Filled 2013-04-10 (×2): qty 1

## 2013-04-10 MED ORDER — SODIUM CHLORIDE BACTERIOSTATIC 0.9 % IJ SOLN
INTRAMUSCULAR | Status: AC
Start: 2013-04-10 — End: ?
  Filled 2013-04-10: qty 30

## 2013-04-10 MED ORDER — CELECOXIB 200 MG PO CAPS
ORAL_CAPSULE | ORAL | Status: AC
Start: 2013-04-10 — End: ?
  Filled 2013-04-10: qty 2

## 2013-04-10 MED ORDER — CEFAZOLIN SODIUM-DEXTROSE 2-3 GM-% IV SOLR
2000.0000 mg | Freq: Once | INTRAVENOUS | Status: AC | PRN
Start: 2013-04-10 — End: 2013-04-10
  Administered 2013-04-10: 2 g via INTRAVENOUS

## 2013-04-10 MED ORDER — EPHEDRINE SULFATE 50 MG/ML IJ SOLN
INTRAMUSCULAR | Status: AC
Start: 2013-04-10 — End: ?
  Filled 2013-04-10: qty 1

## 2013-04-10 MED ORDER — ONDANSETRON HCL 4 MG/2ML IJ SOLN
4.0000 mg | Freq: Once | INTRAMUSCULAR | Status: DC | PRN
Start: 2013-04-10 — End: 2013-04-10

## 2013-04-10 MED ORDER — HYDROCODONE-ACETAMINOPHEN 5-325 MG PO TABS
1.0000 | ORAL_TABLET | Freq: Once | ORAL | Status: DC | PRN
Start: 2013-04-10 — End: 2013-04-10

## 2013-04-10 MED ORDER — LACTATED RINGERS IV SOLN
INTRAVENOUS | Status: DC
Start: 2013-04-10 — End: 2013-04-10

## 2013-04-10 MED ORDER — BACITRACIN 50000 UNITS IM SOLR
INTRAMUSCULAR | Status: AC
Start: 2013-04-10 — End: ?
  Filled 2013-04-10: qty 100000

## 2013-04-10 MED ORDER — DIPHENHYDRAMINE HCL 25 MG PO CAPS
25.0000 mg | ORAL_CAPSULE | Freq: Every evening | ORAL | Status: DC | PRN
Start: 2013-04-10 — End: 2013-04-12

## 2013-04-10 MED ORDER — FERROUS SULFATE 324 (65 FE) MG PO TBEC
324.0000 mg | DELAYED_RELEASE_TABLET | Freq: Every morning | ORAL | Status: DC
Start: 2013-04-11 — End: 2013-04-12
  Administered 2013-04-11 – 2013-04-12 (×2): 324 mg via ORAL
  Filled 2013-04-10 (×2): qty 1

## 2013-04-10 MED ORDER — ACETAMINOPHEN 325 MG PO TABS
ORAL_TABLET | ORAL | Status: AC
Start: 2013-04-10 — End: 2013-04-10
  Administered 2013-04-10: 650 mg via ORAL
  Filled 2013-04-10: qty 2

## 2013-04-10 MED ORDER — CEFAZOLIN SODIUM-DEXTROSE 2-3 GM-% IV SOLR
INTRAVENOUS | Status: AC
Start: 2013-04-10 — End: ?
  Filled 2013-04-10: qty 50

## 2013-04-10 MED ORDER — SODIUM CHLORIDE BACTERIOSTATIC 0.9 % IJ SOLN
INTRAMUSCULAR | Status: DC | PRN
Start: 2013-04-10 — End: 2013-04-10
  Administered 2013-04-10: 30 mL via INTRAMUSCULAR

## 2013-04-10 MED ORDER — BUPIVACAINE HCL (PF) 0.5 % IJ SOLN
INTRAMUSCULAR | Status: AC
Start: 2013-04-10 — End: ?
  Filled 2013-04-10: qty 30

## 2013-04-10 MED ORDER — HYDROMORPHONE HCL PF 1 MG/ML IJ SOLN
INTRAMUSCULAR | Status: AC
Start: 2013-04-10 — End: 2013-04-10
  Administered 2013-04-10: 0.5 mg via INTRAVENOUS
  Filled 2013-04-10: qty 1

## 2013-04-10 MED ORDER — HETASTARCH-NACL 6-0.9 % IV SOLN
INTRAVENOUS | Status: DC | PRN
Start: 2013-04-10 — End: 2013-04-10
  Administered 2013-04-10: 500 mL via INTRAVENOUS

## 2013-04-10 MED ORDER — LIDOCAINE-EPINEPHRINE 1.5 %-1:200000 IJ SOLN
INTRAMUSCULAR | Status: DC | PRN
Start: 2013-04-10 — End: 2013-04-10
  Administered 2013-04-10: 5 mL via EPIDURAL

## 2013-04-10 MED ORDER — KETOROLAC TROMETHAMINE 30 MG/ML IJ SOLN
INTRAMUSCULAR | Status: DC | PRN
Start: 2013-04-10 — End: 2013-04-10
  Administered 2013-04-10: 30 mg via INTRAMUSCULAR

## 2013-04-10 SURGICAL SUPPLY — 66 items
ADHESIVE SKIN SURGISEAL .35ML (Suture) ×4 IMPLANT
BLADE SAGITTAL 18X1.27X90MM (Blade) ×2 IMPLANT
BLADE SAW RECIPROCATING 90MM (Blade) ×2 IMPLANT
BLADE SAW THK1 MM RECIPROCATE OFFSET 2 (Blade) ×1 IMPLANT
BLADE SAW THK1 MM RECIPROCATE OFFSET 2 SIDE NA L70 MM X W12.5 MM (Blade) ×1 IMPLANT
BLADE SW SS THK1MM 70X12.5MM LF STRL (Blade) ×2
BOWL CEMENT GUN BATCH W NOZZLE (Ortho Supply) ×2 IMPLANT
CEMENT BN TBR SMPX P STRL FD RADOPQ (Cement) ×2 IMPLANT
CEMENT BN TBR SMPX P STRL RADOPQ FD (Cement) ×2 IMPLANT
CEMENT BONE RADIOPAQUE FULL DOSE SIMPLEX (Cement) ×2 IMPLANT
CEMENT BONE RADIOPAQUE FULL DOSE SIMPLEX P TOBRAMYCIN (Cement) ×2 IMPLANT
COMPONENT FEM COCR 5 SGM STRL POST STAB (Component) ×2 IMPLANT
COMPONENT FEMORAL 5 KNEE LEFT POSTERIOR STABILIZE CEMENT COCR SIGMA (Component) ×1 IMPLANT
COMPONENT FEMORAL 5 KNEE LT POSTERIOR (Component) ×1 IMPLANT
COMPONENT TIB ALPLY 4 PFC SGM 8MM STRL (Insert) ×2 IMPLANT
COMPONENT TIBIAL KNEE H8 MM 4 PFC SIGMA (Insert) ×1 IMPLANT
COMPONENT TIBIAL KNEE H8 MM 4 PFC SIGMA ALL POLY POSTERIOR STABILIZE (Insert) ×1 IMPLANT
DOME PATELLAR OD41 MM CEMENT 3 PEG LARGE (Component) ×1 IMPLANT
DOME PATELLAR OD41 MM CEMENT 3 PEG LARGE OVAL PFC SIGMA KNEE UHMWPE (Component) ×1 IMPLANT
DOME PTLR UHMWPE LG OVL PFC SGM 41MM (Component) ×2 IMPLANT
DRAPE 3/4 SHEET FANFLD 52X76IN (Drape) ×2 IMPLANT
DRESSING GRMCDL HDRFB IONIC SLVR PU AQCL (Dressing) IMPLANT
DRESSING TRANSPARENT L10 IN X W4 IN (Dressing) ×1 IMPLANT
DRESSING TRANSPARENT L10 IN X W4 IN POLYURETHANE ADHESIVE (Dressing) ×1 IMPLANT
DRESSING TRANSPARENT L4 3/4 IN X W4 IN (Dressing) IMPLANT
DRESSING TRANSPARENT L4 3/4 IN X W4 IN POLYURETHANE ADHESIVE (Dressing) IMPLANT
DRESSING TRNS PU STD TGDRM 4.75X4IN LF (Dressing)
DRESSING TRNS PU TGDRM 10X4IN LF STRL (Dressing) ×2
GLOVE SURG BIOGEL INDIC SZ 8.5 (Glove) ×2 IMPLANT
GLOVE SURG BIOGEL LF SZ8 (Glove) ×2 IMPLANT
GOWN OPTIMA STRL BACK OR (Gown) ×2 IMPLANT
GOWN X-LRG POLY REINFORCED (Gown) ×2 IMPLANT
HOOD T4 STERI-SHIELD (Personal Protection) ×6 IMPLANT
KIT DRAINAGE L12.5 IN ROUND 3 SPRING (Drain) IMPLANT
KIT DRAINAGE L12.5 IN ROUND 3 SPRING EVACUATOR Y CONNECTOR TUBE HOLE (Drain) IMPLANT
KIT DRN PVC 400CC RND 1/8IN 12.5IN LF (Drain)
NEEDLE SPINAL DISP 18GX3.5IN (Needles) ×2 IMPLANT
PAD ELECTROSRG GRND REM W CRD (Procedure Accessories) ×2 IMPLANT
SOL NACL 0.9% IRR 3000CC (Irrigation Solutions) ×1
SOLUTION IRR 0.9% NACL 1000ML LF STRL (Irrigation Solutions) ×2
SOLUTION IRR 0.9% NACL 3L URMTC LF PLS (Irrigation Solutions) ×1 IMPLANT
SOLUTION IRRIGATION 0.9% SODIUM CHLORIDE (Irrigation Solutions) ×1 IMPLANT
SOLUTION IRRIGATION 0.9% SODIUM CHLORIDE 1000 ML PLASTIC POUR BOTTLE (Irrigation Solutions) ×1 IMPLANT
SOLUTION IRRIGATION 0.9% SODIUM CHLORIDE 3000 ML PLASTIC CONTAINER (Irrigation Solutions) ×1 IMPLANT
SOLUTION SRGPRP 74% ISPRP 0.7% IOD (Prep) ×2
SOLUTION SRGSCRB 10% PVP IOD 4OZ PLS BTL (Scrub Supplies) ×2 IMPLANT
SOLUTION SURGICAL PREP 26 ML DURAPREP (Prep) ×1 IMPLANT
SOLUTION SURGICAL PREP 26 ML DURAPREP 74% ISOPROPYL ALCOHOL 0.7% (Prep) ×1 IMPLANT
SOLUTION SURGICAL SCRUB 10% PVP IODINE 4OZ PLASTIC BOTTLE AQUEOUS SKIN (Scrub Supplies) ×1 IMPLANT
STAPLER SKIN L3.9 MM X W6.9 MM WIDE 35 (Skin Closure) ×1 IMPLANT
STAPLER SKIN L3.9 MM X W6.9 MM WIDE 35 COUNT FIX HEAD RATCHET (Skin Closure) ×1 IMPLANT
STAPLER SKN SS W PRX PX .58MM 3.9X6.9MM (Skin Closure) ×2
SYRINGE 50 ML GRADUATE NONPYROGENIC DEHP (Syringes, Needles) ×1 IMPLANT
SYRINGE 50 ML GRADUATE NONPYROGENIC DEHP FREE PVC FREE LOK MEDICAL (Syringes, Needles) ×1 IMPLANT
SYRINGE BULB 60CC LATEX FREE (Syringes, Needles) ×2 IMPLANT
SYRINGE MED 50ML LL LF STRL GRAD N-PYRG (Syringes, Needles) ×2
TOURNIQUET 30'STRL EA=1 (Ortho Supply) ×2 IMPLANT
TOURNIQUET 34X4 PURPLE (Procedure Accessories) IMPLANT
TOURNIQUET 44X4 BLUE (Procedure Accessories) IMPLANT
TRAY CATH FOLEY SILICONE 16FR (Catheter Urine) IMPLANT
TRAY TOTAL KNEE PACK (Tray) ×2 IMPLANT
WATER STERILE PLASTIC POUR BOTTLE 1000 (Irrigation Solutions) ×2 IMPLANT
WATER STERILE PLASTIC POUR BOTTLE 1000 ML (Irrigation Solutions) ×2 IMPLANT
WATER STRL 1000ML LF PLS PR BTL (Irrigation Solutions) ×2
WATER STRL 1000ML PLS PR BTL LF (Irrigation Solutions) ×2
WOULD CLOSER QUILL SIZE 2 PDO (Procedure Accessories) ×2 IMPLANT

## 2013-04-10 NOTE — Progress Notes (Signed)
Courtesy call provided to Dr. Alycia Rossetti informing him that patient had LTK surgery today.

## 2013-04-10 NOTE — Anesthesia Postprocedure Evaluation (Signed)
Anesthesia Post Evaluation    Patient: George King    Procedures performed: Procedure(s) with comments:  ARTHROPLASTY, KNEE, TOTAL    Anesthesia type: Combined Spinal/Epidural    Patient location:Phase I PACU    Last vitals:   Filed Vitals:    04/10/13 0930   BP: 125/62   Pulse: 68   Temp:    Resp: 16   SpO2: 100%       Post pain: Patient not complaining of pain, continue current therapy      Mental Status:awake    Respiratory Function: tolerating nasal cannula    Cardiovascular: stable    Nausea/Vomiting: patient not complaining of nausea or vomiting    Hydration Status: adequate    Post assessment: no apparent anesthetic complications

## 2013-04-10 NOTE — Transfer of Care (Signed)
Anesthesia Transfer of Care Note    Patient: George King    Procedures performed: Procedure(s) with comments:  ARTHROPLASTY, KNEE, TOTAL    Anesthesia type: Combined Spinal/Epidural    Patient location:Phase I PACU    Last vitals:   Filed Vitals:    04/10/13 0725   BP: 93/51   Pulse: 74   Temp:    Resp:    SpO2: 93%       Post pain: Patient not complaining of pain, continue current therapy      Mental Status:awake    Respiratory Function: tolerating face mask    Cardiovascular: stable    Nausea/Vomiting: patient not complaining of nausea or vomiting    Hydration Status: adequate    Post assessment: no apparent anesthetic complications

## 2013-04-10 NOTE — Plan of Care (Signed)
Problem: Day of Surgery- Knee Surgery  Goal: Patient/Patient Care Companion demonstrates understanding of disease process, treatment plan, medications, and discharge plan (Day of Surgery Post-op- Knee Surgery)  Outcome: Completed Date Met:  04/10/13   Patient is doing well DOS,walked and pain is minimal.

## 2013-04-10 NOTE — PT Eval Note (Signed)
[facility]  [address]  [place] [address]  [phone]    Physical Therapy Evaluation    Patient: George King MRN: [id number]   Unit: 4A ACUTE JOINT Bed: A213/Y865-78    Time of Treatment: Time Calculation  PT Received On: 04/10/13  Start Time: 1100  Stop Time: 1145  Time Calculation (min): 45 min    Consult received for George King for PT evaluation and treatment.  Patient's medical condition is appropriate for Physical Therapy  intervention at this time.        Precautions  Weight Bearing Status: LLE WBAT  Total Knee Replacement:  (Left Kee Replacement by Dr. Deloria Lair on 04/10/13.)  Precaution Instructions Given to Patient: Yes  Exercises:     History of Present Illness: George King is a 79 y.o. male admitted on 04/10/2013   Medical Diagnosis: Primary localized osteoarthrosis, lower leg, left [715.16] (Primary localized osteoarthrosis, lower leg, left [715.16])  S/P total knee arthroplasty  Surgical Diagnosis: Left TKA Procedure(s):  ARTHROPLASTY, KNEE, TOTAL performed by  Henderson Baltimore, MD on 04/10/2013.      Patient Active Problem List   Diagnosis   . S/P total knee arthroplasty     Past Medical History   Diagnosis Date   . Hyperlipidemia    . BPH (benign prostatic hyperplasia)    . Urinary frequency      at night   . Arthritis    . Low back pain    . Fracture of unspecified bones      rt arm   . Rash      ankles intermittently   . Bilateral cataracts      s/p bilat extraction   . H/O nasal polyp      Past Surgical History   Procedure Date   . Eye surgery 2010/2011     bilat cataract extraction   . Hernia repair 1988     left inguinal   . Tonsillectomy    . Carotid endarterectomy 11/25/2009     left   . Colonoscopy    . Nasal polypectomy          Social History:  Prior Level of Function  Prior level of function: Ambulates / Performs ADL's independently  Assistive Device: Front wheel walker  Baseline Activity Level: Community ambulation  DME Currently at Home: Single point  cane;Crutches;Standard walker    Home Living Arrangements  Living Arrangements: Alone  Type of Home: House  Home Layout: One level;Laundry in basement;Stairs to enter without rails (add number in comment) (3  steps w/o railing, holds on to door/ door frame.)  Bathroom Shower/Tub: Tub/shower unit  Bathroom Toilet: Standard  Bathroom Equipment: Grab bars in shower;Shower chair;Hand-held shower  Bathroom Accessibility: Accessible  DME Currently at Home: Single point cane;Crutches;Standard walker    Subjective: Patient is agreeable to participation in the therapy session. Family and/or guardian are agreeable to patient's participation in the therapy session.  Patient Goal  Patient Goal:  (to be painfree, walk w/o pain.)      Pain Assessment  Pain Assessment: No/denies pain     Functional Limits  Pain Level Best: 2  Pain Level Worst: 8    Objective:  Patient is in bed with dressings, AVIs and 2Liter O2 NC, seen Day of Surgery.    Observation of patient:  Inspection/Posture  Inspection/Posture: supine in bed  Cognition  Arousal/Alertness: Appropriate responses to stimuli  Attention Span: Appears intact  Memory: Appears intact  Following Commands: Follows all commands and directions without difficulty  Safety Awareness: minimal verbal instruction  Insights: Fully aware of deficits  Problem Solving: Assistance required to identify errors made       Vitals: 147/75 before ex; 128/58 after ex and amb; 152/65 when returned to bed      Musculoskeletal Examination  Gross ROM  Right Upper Extremity ROM: within functional limits  Left Upper Extremity ROM: within functional limits  Right Lower Extremity ROM: within functional limits  Left Lower Extremity ROM: within functional limits (0-108*)                                  Sensation is intact     Gross Strength  Right Upper Extremity Strength: within functional limits  Left Upper Extremity Strength: within functional limits  Right Lower Extremity Strength: within functional  limits  Left Lower Extremity Strength: needs focused assessment  L Knee Flexion: 4+/5  L Knee Extension: 4/5           Strength LLE  L Knee Flexion: 4+/5  L Knee Extension: 4/5      Functional Mobility  Rolling: Stand by assistance  Supine to Sit: Min assist to left  Scooting: Stand by assistance (verbal cues)  Sit to Supine: Min assist to left  Sit to Stand: Min assist  Stand to Sit: Min assist    Transfers  Posterior Scoot Transfers: Supervision  Balance  Balance: within functional limits    Locomotion  Ambulation Distance (Feet): 30 Feet  Pattern: decreased cadence;decreased step length  Weight Bearing Status: Total  Weight Bearing Percent: 100       Therapeutic Exercises:  Straight Leg Raise: 5  Quad Sets: 5  Heelslides: 5  Glute Sets: 5  Hip Abduction: 5  Knee AROM Short Arc Quad: 5  Ankle Pumps: 5      Participation and Endurance  Participation Effort: good  Endurance: Tolerates < 10 min exercise with changes in vital signs        Educated the patient to role of physical therapy, plan of care, goals  of therapy and HEP, safety with mobility and ADLs, weight bearing precautions.    Patient is in bed  with all needs provided and call bell within reach. RN notified of session outcome.        Assessment:  George King is a 79 y.o. male admitted 04/10/2013. Pt presents with  Left TKA  Procedure(s):  ARTHROPLASTY, KNEE, TOTAL. Patient present with deficits in strength, function, ambulation and ADLS.  Pt would benefit from skilled Physical Therapy Services.  Assessment  Assessment: Decreased LE strength;Gait impairment  Prognosis: Good;With continued PT status post acute discharge     Goals  Goal Formulation: With patient/family  Time for Goal Acheivement: 7 visits  Goals: Select goal  Pt Will Roll Left: Independently  Pt Will Sit Edge of Bed: Independently  Pt Will Stand: Independently;With stand by assist  Pt Will Transfer Bed/Chair: With stand by assist  Pt Will Transfer to Toilet: with rolling walker;With stand  by assist  Pt Will Transfer to Tub/Shower: with rolling walker;With stand by assist  PT Will Demonstrate Car Transfer Technique: with rolling walker;With minimal assist  Pt Will Ambulate: 101-150 feet;with rolling walker;With stand by assist  Pt Will Go Up / Down Stairs: 3-5 stairs;by time of discharge  Pt Will Perform Home Exer Program: With stand by assist  Pt Will  Perform LE Dressing w/Device: At edge of bed;With min assist    Rehabilitation Potential  Prognosis: Good;With continued PT status post acute discharge      Plan:   Plan  Risks/Benefits/POC Discussed with Pt/Family: With patient/family  Patient Goal:  (to be painfree, walk w/o pain.)  Treatment/Interventions: Exercise;Gait training;Functional transfer training;LE strengthening/ROM;Endurance training;Patient/family training;Bed mobility  Progress: Improving as expected  PT Frequency: 7x/wk    Recommendation  Discharge Recommendation: Home with home health PT  PT - Next Visit Recommendation: 04/10/13  PT Frequency: 7x/wk    Signature: Bufford Lope, PT

## 2013-04-10 NOTE — Addendum Note (Signed)
Addendum  created 04/10/13 1105 by Remi Haggard, MD    Modules edited:Orders, PRL Based Order Sets

## 2013-04-10 NOTE — Anesthesia Procedure Notes (Signed)
Combined Spinal epidural    Patient location during procedure: pre-opReason for block: Primary Anesthesia In the OR  Requested by Surgeon: Yes   Start time: 04/10/2013 7:15 AM  End time: 04/10/2013 7:25 AM  Staffing  Anesthesiologist: Juliet Rude VPreanesthetic Checklist Completed: patient identified, surgical consent, pre-op evaluation, timeout performed, risks and benefits discussed, monitors and equipment checked, anesthesia consent given and correct site  Timeout Completed:  04/10/2013 7:10 AM  CSE  Patient monitoring: Pulse oximetry, EKG, NIBP and Nasal cannula O2  Premedication: Yes    Patient position: sitting  Sterile Technique: ChloraPrep, sterile drape, mask  and sterile gloves  Skin Local: bupivicaine 0.5%  Interspace: L2-3  Number of attempts: 1    Approach: midline    Injection technique: LOR air  Needle type: Touhy needle     Needle gauge: 17  CSF Return: No  Blood Return: No  Intrathecal space entered with Pencan  Needle Gauge 25 G  CSF Return: Yes   Blood Return:No  Paresthesia Pain: no    Catheter type: side hole    Catheter size: 20 G    Catheter at skin depth: 12 cm  CSF Return: No  Blood Return: No  Test Dose:lidocaine 1.5%    Slow fractionated injection: no  Pressure Paresthesia: no  No Catheter IV/SA Signs or Symptoms    Assessment   Sensory level: T10  Block Outcome: patient tolerated procedure well, no complications and successful block  Additional Notes  lor 6cm cath12

## 2013-04-10 NOTE — Progress Notes (Signed)
The H&P previously completed for this patient has been reviewed. There are no apparent interval changes to the patient's status. The patient has been re-examined and appears ready to proceed with surgery. The patient has been given the opportunity to ask any additional questions regarding the proposed procedure.

## 2013-04-10 NOTE — Plan of Care (Signed)
CM met with patient. Patient plans to discharge home with home care services. Transportation by family. Referrals complete.  Patient agreeable

## 2013-04-10 NOTE — OR Nursing (Signed)
Intraoperative cocktail medication order was reviewed and confirmed with anesthesia before being placed on sterile field.

## 2013-04-10 NOTE — Progress Notes (Signed)
Patient received from PACU. Oriented to room,  alert and oriented X3. VSS 02 sat 97% on 2L nc. Denies pain. Neurovascular status WNL. Denies any numbness, + dorsiflexion. Dressing to left knee dry and intact. Call bell within reach.

## 2013-04-10 NOTE — Progress Notes (Signed)
patient has 2 negative repeat MRSA cultures, following positive culture

## 2013-04-11 LAB — BASIC METABOLIC PANEL
BUN: 21 mg/dL (ref 7–21)
CO2: 23 (ref 22–29)
Calcium: 8.3 mg/dL (ref 7.9–10.6)
Chloride: 106 (ref 98–107)
Creatinine: 0.8 mg/dL (ref 0.7–1.3)
Glucose: 143 mg/dL — ABNORMAL HIGH (ref 70–100)
Potassium: 4.4 (ref 3.5–5.1)
Sodium: 135 — ABNORMAL LOW (ref 136–145)

## 2013-04-11 LAB — GFR: EGFR: >60

## 2013-04-11 LAB — CBC
Hematocrit: 35.9 % — ABNORMAL LOW (ref 42.0–52.0)
Hgb: 11.7 g/dL — ABNORMAL LOW (ref 13.0–17.0)
MCH: 31.2 pg (ref 28.0–32.0)
MCHC: 32.6 g/dL (ref 32.0–36.0)
MCV: 95.7 fL (ref 80.0–100.0)
MPV: 9.5 fL (ref 9.4–12.3)
Nucleated RBC: 0 (ref 0–1)
Platelets: 166 (ref 140–400)
RBC: 3.75 — ABNORMAL LOW (ref 4.70–6.00)
RDW: 13 % (ref 12–15)
WBC: 7.67 (ref 3.50–10.80)

## 2013-04-11 MED ORDER — DOCUSATE SODIUM 100 MG PO CAPS
100.0000 mg | ORAL_CAPSULE | Freq: Two times a day (BID) | ORAL | Status: AC
Start: 2013-04-11 — End: ?

## 2013-04-11 MED ORDER — TRAMADOL HCL 50 MG PO TABS
50.0000 mg | ORAL_TABLET | Freq: Four times a day (QID) | ORAL | Status: DC | PRN
Start: 2013-04-11 — End: 2017-11-03

## 2013-04-11 MED ORDER — IRON-VITAMIN C 65-125 MG PO TABS
1.0000 | ORAL_TABLET | Freq: Every day | ORAL | Status: AC
Start: 2013-04-11 — End: 2013-05-11

## 2013-04-11 MED ORDER — ASPIRIN 325 MG PO TABS
325.0000 mg | ORAL_TABLET | Freq: Two times a day (BID) | ORAL | Status: AC
Start: 2013-04-11 — End: 2013-05-11

## 2013-04-11 MED ORDER — AMLODIPINE-OLMESARTAN 5-20 MG PO TABS
5.0000 mg | ORAL_TABLET | Freq: Every day | ORAL | Status: DC
Start: 2013-04-11 — End: 2013-04-12
  Administered 2013-04-11: 5 mg via ORAL

## 2013-04-11 MED ORDER — CELECOXIB 100 MG PO CAPS
200.0000 mg | ORAL_CAPSULE | Freq: Every day | ORAL | Status: AC
Start: 2013-04-11 — End: 2013-05-11

## 2013-04-11 MED ORDER — HYDROCODONE-ACETAMINOPHEN 5-325 MG PO TABS
1.0000 | ORAL_TABLET | ORAL | Status: DC | PRN
Start: 2013-04-11 — End: 2017-11-03

## 2013-04-11 NOTE — Addendum Note (Signed)
Addendum  created 04/11/13 1623 by Remi Haggard, MD    Modules edited:Notes Section

## 2013-04-11 NOTE — H&P (Signed)
The history and physical exam completed by internal medicine consultant for this patient has been reviewed. The patient was reexamined.  There are no apparent interval changes to the patient's status.. The patient appears ready to proceed with surgery. The patient has been given the opportunity to ask any additional questions regarding the proposed procedure.  The patient appears ready to proceed as planned. All questions were answered.

## 2013-04-11 NOTE — Progress Notes (Signed)
PROGRESS NOTE    Date Time: 04/11/2013 8:12 AM  Patient Name: George King, George King      Assessment:   L TKA on 9/23    Plan:   Continue PT/OT:  WBAT on BLE  Acute blood loss anemia: asymptomatic and vital signs stable  DVT prophylaxis with:  SCDs, aspirin, and mobilization  Discharge planning - home after cleared by PT    Subjective:   Pain controlled, tolerating diet, participating in PT/OT    Medications:     Current Facility-Administered Medications   Medication Dose Route Frequency   . aspirin EC  325 mg Oral BID   . [COMPLETED] ceFAZolin  2 g Intravenous Q8H   . celecoxib  200 mg Oral Q12H SCH   . [COMPLETED] dexamethasone  2 mg Oral Q6H SCH   . docusate sodium  100 mg Oral BID   . Ferrous Sulfate  324 mg Oral QAM W/BREAKFAST   . [COMPLETED] ketorolac  15 mg Intravenous Once   . traMADol  50 mg Oral Q6H SCH   . [COMPLETED] tranexamic acid (CYKLOPRON) IVPB  1,000 mg Intravenous Once   . vitamin C  500 mg Oral QAM W/BREAKFAST   . [DISCONTINUED] acetaminophen  650 mg Oral Q4H       Physical Exam:     Filed Vitals:    04/11/13 0443   BP: 137/70   Pulse: 81   Temp: 97.9 F (36.6 C)   Resp: 20   SpO2: 98%       Intake and Output Summary (Last 24 hours) at Date Time    Intake/Output Summary (Last 24 hours) at 04/11/13 1610  Last data filed at 04/10/13 1021   Gross per 24 hour   Intake   1020 ml   Output    100 ml   Net    920 ml       General appearance - alert and oriented, in no acute distress  Dressing c/d/i  Incision without evidence of infection  Fires TA/EHL/GSC  SILT SP/DP/TN  2+ DP pulse  Calves soft, nontender bilateral    Labs:     Recent CBC Recent Labs   Basename 04/11/13 0513    RBC 3.75*    HGB 11.7*    HCT 35.9*    LABPLAT --     Recent BMP Recent Labs   Basename 04/11/13 0513    GLU 143*    BUN 21    CREAT 0.8    CA 8.3    NA 135*    K 4.4    CL 106    CO2 23     Recent PT/INR No results found for this basename: PTI,INR,COUM,COUMP in the last 24 hours      Signed by: Robb Matar

## 2013-04-11 NOTE — Anesthesia Postprocedure Evaluation (Signed)
Anesthesia Post Evaluation    Patient: George King    Procedures performed: Procedure(s) with comments:  ARTHROPLASTY, KNEE, TOTAL    Anesthesia type: Combined Spinal/Epidural    Patient location:[facility]    Last vitals:   Filed Vitals:    04/11/13 1416   BP: 133/60   Pulse: 88   Temp: 95.9 F (35.5 C)   Resp: 16   SpO2:        Post pain: Patient not complaining of pain, continue current therapy      Mental Status:awake    Respiratory Function: tolerating room air    Cardiovascular: stable    Nausea/Vomiting: patient not complaining of nausea or vomiting    Hydration Status: adequate    Post assessment: no apparent anesthetic complications

## 2013-04-11 NOTE — PT Progress Note (Signed)
Physical Therapy Treatment    Patient:  George King        MRN#:  [id number]  Unit:  4A ACUTE JOINT        Room/Bed:  V409/W119-14    Time of treatment: Start Time: 0800 Stop Time: 0900  Time Calculation (min): 60 min  PT Received On: 04/11/13    Treatment #: PT Visit Number: 1/7    Precautions  Weight Bearing Status: LLE WBAT    Medical Diagnosis:   Primary localized osteoarthrosis, lower leg, left [715.16] (Primary localized osteoarthrosis, lower leg, left [715.16])  S/P total knee arthroplasty  Surgical Procedure:Left  Procedure(s):  ARTHROPLASTY, KNEE, TOTAL performed by Henderson Baltimore, MD on 04/10/2013.      Patient's medical condition is appropriate for Physical Therapy  intervention at this time.  Patient cleared by nurse to participate in PT session.    Subjective: I feel unsteady  Patient is agreeable to participation in the therapy session. Family and/or guardian are agreeable to patient's participation in the therapy session.     Pain Assessment  Pain Assessment: Numeric Scale (0-10)  Pain Score: 4-moderate pain          Objective:  Patient is in bed seen 1 Day Post-Op with IV and dressings.                                       Sensation is intact       Cognition  Problem Solving: Assistance required to generate solutions;Assistance required to identify errors made;Assistance required to implement solutions      Functional Mobility  Supine to Sit: Stand by assistance  Scooting: Stand by assistance  Sit to Supine: Stand by assistance  Sit to Stand: Min assist;Mod assist (verbal cues for safety, hand placement)  Stand to Sit: Min assist;Mod assist (verbal cues for safety, hand placement)    Transfers  Bed to Chair: Mod assist to right (using RW, gait belt, verbal cues)  Device Used for Functional Transfer: front-wheeled walker    Locomotion  Pattern: R foot decreased clearance;L foot decreased clearance;L decreased stance time;R flexed knee (difficulty maintaining balance, )  Weight Bearing Status:  Total    Therapeutic Exercise  Straight Leg Raise: 10  Quad Sets: 10  Heelslides: 25  Glute Sets: 10  Hip Abduction: 25  Knee AROM : 10-90  Knee AROM Short Arc Quad: 25  Ankle Pumps: 10       Self Care and Home Management:  Lower body dressing: Patient performed  with minimal assistance    Educated the patient to role of physical therapy, plan of care, goals  of therapy and HEP, safety with mobility and ADLs.    Patient is seated in wheelchair at nursing station with all needs provided and call bell within reach. RN notified of session outcome.    Assessment:   Assessment  Assessment: Decreased LE strength;Decreased LE ROM;Gait impairment;Decreased functional mobility;Decreased endurance/activity tolerance  Prognosis: Good;With continued PT status post acute discharge.     Pt's progress currently limited by initial poor standing balance which improved to fair.  Pt required mod/min assistance to avoid loss of balance posteriorly.        Goals per Eval/Re-eval:     Goals  Goal Formulation: With patient/family  Time for Goal Acheivement: 7 visits  Goals: Select goal  Pt Will Roll Left: Independently  Pt Will Sit Edge  of Bed: Independently  Pt Will Stand: Independently;With stand by assist  Pt Will Transfer Bed/Chair: With stand by assist  Pt Will Transfer to Toilet: with rolling walker;With stand by assist  Pt Will Transfer to Tub/Shower: with rolling walker;With stand by assist  PT Will Demonstrate Car Transfer Technique: with rolling walker;With minimal assist  Pt Will Ambulate: 101-150 feet;with rolling walker;With stand by assist  Pt Will Go Up / Down Stairs: 3-5 stairs;by time of discharge  Pt Will Perform Home Exer Program: With stand by assist  Pt Will Perform LE Dressing w/Device: At edge of bed;With min assist    Plan:  Recommendation  Discharge Recommendation: Home with home health PT;Home with supervision  PT - Next Visit Recommendation: 04/10/13  PT Frequency: 7x/wk    Continue plan of care.    Signature:  Madelaine Bhat, PTA 04/11/2013 9:58 AM

## 2013-04-11 NOTE — PT Progress Note (Signed)
Physical Therapy Treatment    Patient:  George King        MRN#:  [id number]  Unit:  4A ACUTE JOINT        Room/Bed:  V409/W119-14    Time of treatment: Start Time: 1330 Stop Time: 1415  Time Calculation (min): 45 min  PT Received On: 04/11/13    Treatment #: PT Visit Number: 2/7    Precautions  Weight Bearing Status: LLE WBAT    Medical Diagnosis:   Primary localized osteoarthrosis, lower leg, left [715.16] (Primary localized osteoarthrosis, lower leg, left [715.16])  S/P total knee arthroplasty  Surgical Procedure:Left  Procedure(s):  ARTHROPLASTY, KNEE, TOTAL performed by Henderson Baltimore, MD on 04/10/2013.      Patient's medical condition is appropriate for Physical Therapy  intervention at this time.  Patient cleared by nurse to participate in PT session.    Subjective: I feel OK  Patient is agreeable to participation in the therapy session. Family and/or guardian are agreeable to patient's participation in the therapy session.     Pain Assessment  Pain Assessment: Numeric Scale (0-10)  Pain Score: 4-moderate pain          Objective:  Patient is in bed seen 1 Day Post-Op with IV and dressings.                                       Sensation is intact       Cognition  Problem Solving: Assistance required to generate solutions;Assistance required to identify errors made;Assistance required to implement solutions      Functional Mobility  Supine to Sit: Stand by assistance  Scooting: Stand by assistance  Sit to Supine: Stand by assistance  Sit to Stand: Min assist  Stand to Sit: Min assist    Transfers  Bed to Chair: Mod assist to right (using RW, gait belt, verbal cues)  Device Used for Functional Transfer: front-wheeled walker    Locomotion  Ambulation: moderate assistance;with front-wheeled walker  Ambulation Distance (Feet): 30 Feet (x 2)  Pattern: R foot decreased clearance;L foot decreased clearance;R flexed knee;L decreased stance time;decreased cadence;decreased step length (decreased core stability,  narrrow BOS)  Weight Bearing Status: Total    Therapeutic Exercise  Straight Leg Raise: 10  Quad Sets: 10  Heelslides: 25  Glute Sets: 10  Hip Abduction: 25  Knee AROM : 10-85  Knee AROM Short Arc Quad: 25  Ankle Pumps: 10       Self Care and Home Management:  Pt did not perform self care activities at this time.    Educated the patient to role of physical therapy, plan of care, goals  of therapy and HEP, safety with mobility and ADLs.    Patient is in bed with all needs provided and call bell within reach. RN notified of session outcome.    Assessment:   Assessment  Assessment: Decreased LE strength;Decreased LE ROM;Gait impairment;Decreased functional mobility;Decreased endurance/activity tolerance  Prognosis: Good;With continued PT status post acute discharge.     Pt's safe ambulation currently limited by fair+ dynamic balance, requiring mod assist for stability.        Goals per Eval/Re-eval:     Goals  Goal Formulation: With patient/family  Time for Goal Acheivement: 7 visits  Goals: Select goal  Pt Will Roll Left: Independently  Pt Will Sit Edge of Bed: Independently  Pt Will Stand: Independently;With stand  by assist  Pt Will Transfer Bed/Chair: With stand by assist  Pt Will Transfer to Toilet: with rolling walker;With stand by assist  Pt Will Transfer to Tub/Shower: with rolling walker;With stand by assist  PT Will Demonstrate Car Transfer Technique: with rolling walker;With minimal assist  Pt Will Ambulate: 101-150 feet;with rolling walker;With stand by assist  Pt Will Go Up / Down Stairs: 3-5 stairs;by time of discharge  Pt Will Perform Home Exer Program: With stand by assist  Pt Will Perform LE Dressing w/Device: At edge of bed;With min assist    Plan:  Recommendation  Discharge Recommendation: Home with home health PT;Home with supervision  DME Recommended for Discharge: Front wheel walker  PT - Next Visit Recommendation: 04/12/13  PT Frequency: 7x/wk    Continue plan of care.    Signature: Curt Bears  Ilena Dieckman, PTA 04/11/2013 2:25 PM

## 2013-04-12 LAB — CBC
Hematocrit: 34.6 % — ABNORMAL LOW (ref 42.0–52.0)
Hgb: 11.2 g/dL — ABNORMAL LOW (ref 13.0–17.0)
MCH: 30.9 pg (ref 28.0–32.0)
MCHC: 32.4 g/dL (ref 32.0–36.0)
MCV: 95.6 fL (ref 80.0–100.0)
MPV: 9.9 fL (ref 9.4–12.3)
Nucleated RBC: 0 (ref 0–1)
Platelets: 173 (ref 140–400)
RBC: 3.62 — ABNORMAL LOW (ref 4.70–6.00)
RDW: 13 % (ref 12–15)
WBC: 8.03 (ref 3.50–10.80)

## 2013-04-12 LAB — BASIC METABOLIC PANEL
BUN: 27 mg/dL — ABNORMAL HIGH (ref 7–21)
CO2: 27 (ref 22–29)
Calcium: 8.5 mg/dL (ref 7.9–10.6)
Chloride: 104 (ref 98–107)
Creatinine: 0.8 mg/dL (ref 0.7–1.3)
Glucose: 100 mg/dL (ref 70–100)
Potassium: 4.6 (ref 3.5–5.1)
Sodium: 135 — ABNORMAL LOW (ref 136–145)

## 2013-04-12 LAB — GFR: EGFR: >60

## 2013-04-12 NOTE — Discharge Instructions (Signed)
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  Continue with individual exercise     program until follow-up visit with patient.   *Full weight bearing status   * Active exercises:  Abduction/Flexion/Heelslides   * Passive exercises:  Abduction/Flexion/Heelslides   * Remove tegaderm POD #10.    Outpatient physical therapy:[facility]                                                [address]                                               [place] [address]                                               Phone: [phone]  First appt: 05/07/13 @ 1:00 pm     *Please take photo id, prescription and insurance cards to first appointment  [facility] to notifiy patient of followup appointment with Dr. Donzetta Kohut.  [facility] [phone]    Case Manager:  Whitman Hero, RN 04/10/2013 11:45 AM [phone]         [facility] Knee Replacement Discharge Instructions    SWELLING  It is normal to have some swelling in your lower legs after surgery.  Walking every hour and doing your exercises will help strengthen your muscles and resolve the swelling.  If you have swelling, we recommend that you lie down every two hours, elevate your legs with pillows, and apply ice for 15 minutes.  If the swelling does not go away overnight, please call your doctors office.    SHOWERS  You are permitted to shower at home as long as your incision is clean and intact.  If you have a clear plastic dressing, leave that in place for 10 days and just let the water run over the dressing. If you have a cloth or gauze dressing, remove it before the shower and replace it after the shower.  Do not soak in a tub or swim until you doctor says it's okay.    ACTIVITY  You will be using an assistive device ( walker, crutches or cane). Your physical therapist will help you  with this. Most patients are able to get in and out of bed, use the restroom and go up and down stairs when they go home. It's a good idea to have someone with you in case you need help for the first week. We recommend that you get up and move around every 1-1.5 hours while you are awake. This will help with stiffness and reduce the chance of blood clots.    PAIN  You may continue to have pain or soreness for several weeks after surgery.  Gradually taper the frequency and amount of narcotic pain medication you are taking based on the amount of pain or soreness you have.  Extra strength acetaminophen can be used but should not exceed 4000mg   in one day.  Apply ice to the incision for 15 minutes after activity to help ease soreness.    MEDICATIONS  Most patients are discharged with several prescriptions. There are 2 main categories, pain pills and pills to help prevent blood clots.  Pain pills - take these if you need them. When taking narcotic pain medications, be sure to drink plenty of water and use stool softeners, such as Pericolace or Colace, as these pain medications can be constipating.  Blood Thinners - could be aspirin, coumadin, lovenox or mechanical pumps. Make sure you take the medicine properly or use the pumps as prescribed.  Vitron C - this is an over the counter medication that will help to build back your blood count after surgery. We recommend this for most patients.    CALLING THE OFFICE  Call the doctor if you have any additional questions or concerns. After hours one of the fellows that helped with the surgery will return your call. During working hours the staff can answer simple questions. If you have concerns not addressed to your satisfaction by the staff then ask for your doctor's nurse to return your call.        Patient Education   Aspirin Gastro-resistant tablet     What is this medicine?  ASPIRIN (AS pir in) is a pain reliever. It is used to treat mild pain and fever. This medicine is  also used as directed by a doctor to prevent and to treat heart attacks, to prevent strokes, and to treat arthritis or inflammation.  This medicine may be used for other purposes; ask your health care provider or pharmacist if you have questions.  What should I tell my health care provider before I take this medicine?  They need to know if you have any of these conditions:   anemia   asthma   bleeding problems   child with chickenpox, the flu, or other viral infection   diabetes   gout   if you frequently drink alcohol containing drinks   kidney disease   liver disease   low level of vitamin K   lupus   smoke tobacco   stomach ulcers or other problems   an unusual or allergic reaction to aspirin, tartrazine dye, other medicines, dyes, or preservatives   pregnant or trying to get pregnant   breast-feeding  How should I use this medicine?  Take this medicine by mouth with a glass of water. Follow the directions on the package or prescription label. Do not chew, crush, or cut this medicine. You can take this medicine with or without food. If it upsets your stomach, take it with food. Do not take your medicine more often than directed.  Talk to your pediatrician regarding the use of this medicine in children. While this drug may be prescribed for children as young as 8 years of age for selected conditions, precautions do apply. Children and teenagers should not use this medicine to treat chicken pox or flu symptoms unless directed by a doctor.  Patients over 43 years old may have a stronger reaction and need a smaller dose.  Overdosage: If you think you have taken too much of this medicine contact a poison control center or emergency room at once.  NOTE: This medicine is only for you. Do not share this medicine with others.  What if I miss a dose?  If you are taking this medicine on a regular schedule and miss a dose, take it as soon  as you can. If it is almost time for your next dose, take only that  dose. Do not take double or extra doses.  What may interact with this medicine?  Do not take this medicine with any of the following medications:   cidofovir   ketorolac   probenecid  This medicine may also interact with the following medications:   alcohol   alendronate   bismuth subsalicylate   flavocoxid   herbal supplements like feverfew, garlic, ginger, ginkgo biloba, horse chestnut   medicines for diabetes or glaucoma like acetazolamide, methazolamide   medicines for gout   medicines that treat or prevent blood clots like enoxaparin, heparin, ticlopidine, warfarin   other aspirin and aspirin-like medicines   NSAIDs, medicines for pain and inflammation, like ibuprofen or naproxen   pemetrexed   sulfinpyrazone   varicella live vaccine  This list may not describe all possible interactions. Give your health care provider a list of all the medicines, herbs, non-prescription drugs, or dietary supplements you use. Also tell them if you smoke, drink alcohol, or use illegal drugs. Some items may interact with your medicine.  What should I watch for while using this medicine?  If you are treating yourself for pain, tell your doctor or health care professional if the pain lasts more than 10 days, if it gets worse, or if there is a new or different kind of pain. Tell your doctor if you see redness or swelling. Also, check with your doctor if you have a fever that lasts for more than 3 days. Only take this medicine to prevent heart attacks or blood clotting if prescribed by your doctor or health care professional.  Do not take aspirin or aspirin-like medicines with this medicine. Too much aspirin can be dangerous. Always read the labels carefully.  This medicine can irritate your stomach or cause bleeding problems. Do not smoke cigarettes or drink alcohol while taking this medicine. Do not lie down for 30 minutes after taking this medicine to prevent irritation to your throat.  If you are scheduled for any  medical or dental procedure, tell your healthcare provider that you are taking this medicine. You may need to stop taking this medicine before the procedure.  What side effects may I notice from receiving this medicine?  Side effects that you should report to your doctor or health care professional as soon as possible:   allergic reactions like skin rash, itching or hives, swelling of the face, lips, or tongue   black, tarry stools   bloody, coffee ground-like vomit   breathing problems   changes in hearing, ringing in the ears   confusion   general ill feeling or flu-like symptoms   pain on swallowing   redness, blistering, peeling or loosening of the skin, including inside the mouth or nose   trouble passing urine or change in the amount of urine   unusual bleeding or bruising   unusually weak or tired   yellowing of the eyes or skin  Side effects that usually do not require medical attention (report to your doctor or health care professional if they continue or are bothersome):   diarrhea or constipation   nausea, vomiting   stomach gas, heartburn  This list may not describe all possible side effects. Call your doctor for medical advice about side effects. You may report side effects to FDA at 1-800-FDA-1088.  Where should I keep my medicine?  Keep out of the reach of children.  Store at room  temperature between 15 and 30 degrees C (59 and 86 degrees F). Protect from heat and moisture. Do not use this medicine if it has a strong vinegar smell. Throw away any unused medicine after the expiration date.  NOTE:This sheet is a summary. It may not cover all possible information. If you have questions about this medicine, talk to your doctor, pharmacist, or health care provider. Copyright 2014 Gold Standard      MEDICATION: CELEBREX  Celebrex (generic: celecoxib) is a "selective" anti-inflammatory drug. "Selective" means that it is less likely to cause stomach upset or bleeding like older  anti-inflammatory drugs like ibuprofen (Advil, Motrin) or naproxen (Aleve, Naprosyn). It is very useful for arthritis and other painful conditions caused by inflammation.  DIRECTIONS FOR USE:  You may take this medicine with or without food. Taking it with food will reduce stomach upset. Do not take antacids (Mylanta, Maalox, and others) within two hours of this medicine.  WHAT TO WATCH FOR:  POSSIBLE SIDE EFFECTS: Upper abdominal pain, diarrhea, heartburn, dizziness, headache, insomnia, leg swelling (Contact your doctor if these symptoms persist or become severe). Bleeding from the stomach, which may appear as blood in vomit or stool (red or black color) (Contact your doctor or return to this facility promptly).  ALLERGIC REACTION: Rash, itching, swelling, trouble breathing or swallowing (Contact your doctor or return to this facility promptly).  MEDICAL CONDITIONS: Before starting this medicine, be sure your doctor knows if you have any of the following conditions:   Stomach ulcer (active or in the past), history of vomiting blood or bloody stool   Allergic reaction to aspirin or other anti-inflammatory medicines, or sulfa drugs   Asthma, pregnancy or breastfeeding   Liver or kidney disease; congestive heart failure (CHF), heart disease, high blood pressure, bleeding disorder  DRUG INTERACTION: Before starting this medicine, be sure your doctor knows if you are taking any of the following drugs:   Coumadin (warfarin), Lasix (furosemide), thiazides (Diuril, Hydrodiuril), rifampin, lithium, aspirin, methotrexate   ACE inhibitors [Lotensin (benazepril); Capoten (captopril); Vasotec (enalapril); Monopril (fosinopril); Prinivil, Zestril (lisinopril); Accupril (quinapril); Altace (ramipril)]  WARNINGS:   Do not take with aspirin, prednisone, other anti-inflammatory drugs. Long termalcohol use andsmoking while taking this drug increases the risk of getting a bleeding ulcer.   May increase risk of serious and  potentially fatal cardiovascular thrombotic events, MI, and stroke   Do not drive, ride a bicycle, or operate dangerous equipment while taking this medicine until you know how it will affect you.  [NOTE: This information topic may not include all directions, precautions, medical conditions, drug/food interactions and warnings for this drug. Check with your doctor, nurse, or pharmacist for any questions that you may have.]   [address], [address] [place], [place] [address]. All rights reserved. This information is not intended as a substitute for professional medical care. Always follow your healthcare professional's instructions.        Patient Education   Docusate Sodium Oral capsule     What is this medicine?  DOCUSATE (doc CUE sayt) is stool softener. It helps prevent constipation and straining or discomfort associated with hard or dry stools.  This medicine may be used for other purposes; ask your health care provider or pharmacist if you have questions.  What should I tell my health care provider before I take this medicine?  They need to know if you have any of these conditions:   nausea or vomiting   severe constipation  stomach pain   sudden change in bowel habit lasting more than 2 weeks   an unusual or allergic reaction to docusate, other medicines, foods, dyes, or preservatives   pregnant or trying to get pregnant   breast-feeding  How should I use this medicine?  Take this medicine by mouth with a glass of water. Follow the directions on the label. Take your doses at regular intervals. Do not take your medicine more often than directed.  Talk to your pediatrician regarding the use of this medicine in children. While this medicine may be prescribed for children as young as 2 years for selected conditions, precautions do apply.  Overdosage: If you think you have taken too much of this medicine contact a poison control center or emergency room at once.  NOTE: This medicine is  only for you. Do not share this medicine with others.  What if I miss a dose?  If you miss a dose, take it as soon as you can. If it is almost time for your next dose, take only that dose. Do not take double or extra doses.  What may interact with this medicine?   mineral oil  This list may not describe all possible interactions. Give your health care provider a list of all the medicines, herbs, non-prescription drugs, or dietary supplements you use. Also tell them if you smoke, drink alcohol, or use illegal drugs. Some items may interact with your medicine.  What should I watch for while using this medicine?  Do not use for more than one week without advice from your doctor or health care professional. If your constipation returns, check with your doctor or health care professional.  Drink plenty of water while taking this medicine. Drinking water helps decrease constipation.  Stop using this medicine and contact your doctor or health care professional if you experience any rectal bleeding or do not have a bowel movement after use. These could be signs of a more serious condition.  What side effects may I notice from receiving this medicine?  Side effects that you should report to your doctor or health care professional as soon as possible:   allergic reactions like skin rash, itching or hives, swelling of the face, lips, or tongue  Side effects that usually do not require medical attention (report to your doctor or health care professional if they continue or are bothersome):   diarrhea   stomach cramps   throat irritation  This list may not describe all possible side effects. Call your doctor for medical advice about side effects. You may report side effects to FDA at 1-800-FDA-1088.  Where should I keep my medicine?  Keep out of the reach of children.  Store at room temperature between 15 and 30 degrees C (59 and 86 degrees F). Throw away any unused medicine after the expiration date.  NOTE:This sheet is a  summary. It may not cover all possible information. If you have questions about this medicine, talk to your doctor, pharmacist, or health care provider. Copyright 2014 Gold Standard      MEDICATION: HYDROCODONE + ACETAMINOPHEN   Hydrocodone/acetaminophen (brand names: Vicodin, LorTab, Anexsia, Norco) is a narcotic pain medicine This medicine contains Tylenol (acetaminophen).  DIRECTIONS FOR USE:  If this medicine upsets your stomach, take it with food or milk. Pain medication should be taken only if needed at the times prescribed. If you are not having pain, do not take the medicine unless you are advised to do so by your doctor.  Do not take more than 4 grams (4,000 mg) of acetaminophen (Tylenol) per 24 hours.  WHAT TO WATCH FOR:  POSSIBLE SIDE EFFECTS: Dizziness, drowsiness (Take a smaller dose; for example, break the pill in half or take it less often). Constipation (Drink lots of liquids, use small doses of a mild laxative like milk of magnesia, as needed). Nausea, vomiting (Take with food or milk). Difficulty passing urine (Stop the medicine and contact your doctor).  ALLERGIC REACTION: Rash, itching, swelling, trouble breathing or swallowing (Contact your doctor or return to this facility promptly).  MEDICAL CONDITIONS: Before starting this medicine, be sure your doctor knows if you have any of the following conditions:   Prostate enlargement   Lung disease (asthma, COPD)   Allergy to aspirin or other salicylates   Intestinal obstruction or severe constipation   Liver or kidney disease   History of substance abuse   Pregnancy or breastfeeding  DRUG INTERACTIONS: This drug may cause increased side effects when taken with alcohol, muscle relaxants, sedatives, tricyclic antidepressants, MAO inhibitors other pain medicines, other products containing acetaminophen (Tylenol), isoniazid (INH), ritonavir.  WARNINGS:   Do not drive, ride a bicycle, or operate dangerous equipment while taking this medicine  until you know how it will affect you.   Prolonged use of this medicine can be habit forming and may lead to addiction.  [NOTE: This information topic may not include all directions, precautions, medical conditions, drug/food interactions, and warnings for this drug. Check with your doctor, nurse, or pharmacist for any questions that you may have.]   [address], [place], [place] [address]. All rights reserved. This information is not intended as a substitute for professional medical care. Always follow your healthcare professional's instructions.        Patient Education   Ascorbic Acid (Vitamin C), Ferrous Sulfate Oral tablet, extended-release     What is this medicine?  IRON (AHY ern) replaces iron that is essential to healthy red blood cells. Iron is used to treat iron deficiency anemia. Anemia may cause problems like tiredness, shortness of breath, or slowed growth in children. Only take iron if your doctor has told you to. Do not treat yourself with iron if you are feeling tired. Most healthy people get enough iron in their diets, particularly if they eat cereals, meat, poultry, and fish.  This medicine may be used for other purposes; ask your health care provider or pharmacist if you have questions.  What should I tell my health care provider before I take this medicine?  They need to know if you have any of these conditions:   frequently drink alcohol   bowel disease   hemolytic anemia   iron overload (hemochromatosis, hemosiderosis)   liver disease   problems with swallowing   stomach ulcer or other stomach problems   an unusual or allergic reaction to iron, other medicines, foods, dyes, or preservatives   pregnant or trying to get pregnant   breast-feeding  How should I use this medicine?  Take this medicine by mouth with a glass of water or fruit juice. Follow the directions on the prescription label. Swallow whole. Do not crush or chew. Take this medicine in an  upright or sitting position. Try to take any bedtime doses at least 10 minutes before lying down. You may take this medicine with food. Take your medicine at regular intervals. Do not take your medicine more often than directed. Do not stop taking except on your doctor's advice.  Talk to your pediatrician regarding the use of this medicine in children. While this drug may be prescribed for selected conditions, precautions do apply.  Overdosage: If you think you have taken too much of this medicine contact a poison control center or emergency room at once.  NOTE: This medicine is only for you. Do not share this medicine with others.  What if I miss a dose?  If you miss a dose, take it as soon as you can. If it is almost time for your next dose, take only that dose. Do not take double or extra doses.  What may interact with this medicine?  If you are taking this iron product, you should not take iron in any other medicine or dietary supplement.  This medicine may also interact with the following medications:   alendronate   antacids   cefdinir   chloramphenicol   cholestyramine   deferoxamine   dimercaprol   etidronate   medicines for stomach ulcers or other stomach problems   pancreatic enzymes   quinolone antibiotics (examples: Cipro, Floxin, Levaquin, Tequin and others)   risedronate   tetracycline antibiotics (examples: doxycycline, tetracycline, minocycline, and others)   thyroid hormones  This list may not describe all possible interactions. Give your health care provider a list of all the medicines, herbs, non-prescription drugs, or dietary supplements you use. Also tell them if you smoke, drink alcohol, or use illegal drugs. Some items may interact with your medicine.  What should I watch for while using this medicine?  Use iron supplements only as directed by your health care professional. Bonita Quin will need important blood work while you are taking this medicine. It may take 3 to 6 months of therapy  to treat low iron levels. Pregnant women should follow the dose and length of iron treatment as directed by their doctors.  Do not use iron longer than prescribed, and do not take a higher dose than recommended. Long-term use may cause excess iron to build-up in the body.  Do not take iron with antacids. If you need to take an antacid, take it 2 hours after a dose of iron.  What side effects may I notice from receiving this medicine?  Side effects that you should report to your doctor or health care professional as soon as possible:   allergic reactions like skin rash, itching or hives, swelling of the face, lips, or tongue   blue lips, nails, or palms   dark colored stools (this may be due to the iron, but can indicate a more serious condition)   drowsiness   pain with or difficulty swallowing   pale or clammy skin   seizures   stomach pain   unusually weak or tired   vomiting   weak, fast, or irregular heartbeat  Side effects that usually do not require medical attention (report to your doctor or health care professional if they continue or are bothersome):   constipation   indigestion   nausea or stomach upset  This list may not describe all possible side effects. Call your doctor for medical advice about side effects. You may report side effects to FDA at 1-800-FDA-1088.  Where should I keep my medicine?  Keep out of the reach of children. Even small amounts of iron can be harmful to a child.  Store at room temperature between 15 and 30 degrees C (59 and 86 degrees F). Keep container tightly closed. Throw away any unused medicine after the expiration date.  NOTE:This  sheet is a summary. It may not cover all possible information. If you have questions about this medicine, talk to your doctor, pharmacist, or health care provider. Copyright 2014 Gold Standard      MEDICATION: TRAMADOL  You have been prescribed a pain medicine called tramadol (brand: Ultram).  DIRECTIONS FOR USE:  Take this medicine  with a full glass of water, with or without food. Pain medicine should be taken only if needed at the times prescribed. Unless told otherwise, do not take the medicine if you are not having pain.  Oral Disintegrating Tablet (ODT):Take with or without food. Remove tablet from blister pack by peeling the foil back. Do not try to push tablet through the foil.Do not break, chew, split, or crush the tablet. Place tablet in the mouth on the tongue. It will dissolve in one minute.  Extended-release tablets (ER): Take this medicine with a full glass of water, with or without food.Do not break, chew, dissolve, or crush tablets.  WHAT TO WATCH FOR:  POSSIBLE SIDE EFFECTS: Dizziness, drowsiness, or headache (Take a smaller dose or take it less often). Constipation (Drink lots of liquids, use small doses of a mild laxative like milk of magnesia, as needed). Nausea, vomiting (Take with food and a full glass of water). Nervousness, tremor, anxiety, sweating (Contact your doctor). Trouble passing urine, fast or irregular heartbeat, chest pain, fainting, blood in vomit or stools (red or black color), abdominal pain, yellow eyes or skin, dark urine, seizure (Contact your doctor or go to the Emergency Department).  ALLERGIC REACTION: Rash, itching, swelling, trouble breathing or swallowing (Contact your doctor or return to this facility promptly).  MEDICAL CONDITIONS: Before starting this medicine, be sure your doctor knows if you have any of the following conditions:   History of a seizure disorder (epilepsy)   History of alcohol or substance abuse   Kidney or liver disease, heart disease (arrhythmia, heart attack, or angina)   Lung disease (severe asthma, difficulty breathing)   History of depression or other psychiatric illness   Pregnancy or breastfeeding  DRUG INTERACTIONS: Before starting this medicine, be sure your doctor knows if you are taking any of the following medicines:   Medicine for sleep, anxiety, muscle  spasm   Parkinson's medicines (rasagiline, selegiline)   Quinaglute (quinidine), Coumadin (warfarin), Lanoxin (digoxin), Tegretol (carbamazepine)   Antidepressants [Elavil (amitriptyline) and others], narcotic pain medicines   Psychiatric medicine [Thorazine (chlorpromazine), and others], antihistamines, MAO inhibitors within 14 days [furazolidone, linezolid, procarbazine], St. John's wort  WARNINGS:   Do not drive, ride a bicycle, or operate dangerous equipment while taking this medicine until you know how this drug will affect you.   This drug may cause increased side effects when taken with alcohol, muscle relaxants, sedatives, anxiety medicines, antihistamines, antidepressants, MAO-inhibitors, seizure medicines, or another pain medicine.   Prolonged use of this medicine can be habit forming and may lead to addiction.    [NOTE: This information topic may not include all directions, precautions, medical conditions, drug/food interactions, and warnings for this drug. Check with your doctor, nurse, or pharmacist for any questions that you may have.]   [address] [place], [place] [address]. All rights reserved. This information is not intended as a substitute for professional medical care. Always follow your healthcare professional's instructions.

## 2013-04-12 NOTE — Progress Notes (Signed)
Patient took his azor around 11:00am, medication given back to patient  [name]

## 2013-04-12 NOTE — Plan of Care (Signed)
Problem: Day 2 Post-op- Knee Surgery  Goal: Mobility/activity is maintained at optimum level for patient  Outcome: Adequate for Discharge  PT goals met per evaluation. Cleared  PT goals per plan of care.   Louann Liv, PTA 04/12/2013 1:16 PM

## 2013-04-12 NOTE — Progress Notes (Signed)
PROGRESS NOTE    Date Time: 04/12/2013 6:47 AM  Patient Name: George King, George King      Assessment:    L TKA on 9/23     Plan:    Continue PT/OT: WBAT on BLE   Acute blood loss anemia: asymptomatic and vital signs stable   DVT prophylaxis with: SCDs, aspirin, and mobilization   Discharge planning - home after cleared by PT      Subjective:   Pain controlled, tolerating diet, participating in PT/OT    Medications:     Current Facility-Administered Medications   Medication Dose Route Frequency   . amlodipine-olmesartan  5-20 mg Oral Daily   . aspirin EC  325 mg Oral BID   . celecoxib  200 mg Oral Q12H SCH   . docusate sodium  100 mg Oral BID   . Ferrous Sulfate  324 mg Oral QAM W/BREAKFAST   . traMADol  50 mg Oral Q6H SCH   . vitamin C  500 mg Oral QAM W/BREAKFAST       Physical Exam:     Filed Vitals:    04/12/13 0445   BP: 109/55   Pulse: 78   Temp: 98.2 F (36.8 C)   Resp: 18   SpO2: 95%       Intake and Output Summary (Last 24 hours) at Date Time    Intake/Output Summary (Last 24 hours) at 04/12/13 0647  Last data filed at 04/12/13 0300   Gross per 24 hour   Intake      0 ml   Output    400 ml   Net   -400 ml       General appearance - alert and oriented, in no acute distress  Dressing c/d/i  Incision without evidence of infection  Fires TA/EHL/GSC  SILT SP/DP/TN  2+ DP pulse  Calves soft, nontender bilateral    Labs:     Recent CBC Recent Labs   Basename 04/12/13 0521    RBC 3.62*    HGB 11.2*    HCT 34.6*    LABPLAT --     Recent BMP Recent Labs   Basename 04/12/13 0521    GLU 100    BUN 27*    CREAT 0.8    CA 8.5    NA 135*    K 4.6    CL 104    CO2 27     Recent PT/INR No results found for this basename: PTI,INR,COUM,COUMP in the last 24 hours      Signed by: Robb Matar

## 2013-04-12 NOTE — Progress Notes (Signed)
Discharge instrucion given to patient, verbalized understanding, d/c home via w/c in stable condition  [name]

## 2013-04-12 NOTE — PT Progress Note (Signed)
Physical Therapy Treatment    Patient:  George King        MRN#:  [id number]  Unit:  4A ACUTE JOINT        Room/Bed:  V409/W119-14    Time of treatment: Start Time: 1100 Stop Time: 1230  Time Calculation (min): 90 min  PT Received On: 04/12/13    Treatment #: PT Visit Number: 3/7    Precautions  Weight Bearing Status: LLE WBAT    Medical Diagnosis:   Primary localized osteoarthrosis, lower leg, left [715.16] (Primary localized osteoarthrosis, lower leg, left [715.16])  S/P total knee arthroplasty  Surgical Procedure:Left   Procedure(s):  ARTHROPLASTY, KNEE, TOTAL performed by  Henderson Baltimore, MD on 04/10/2013.      Patient's medical condition is appropriate for Physical Therapy  intervention at this time.  Patient cleared by nurse to participate in PT session.    Subjective: Son reports wanting to take his father home, not to go home to SNF.  Lots of family coming in to help.  Son reports he and brother can assist father as needed.  Patient is agreeable to participation in the therapy session.     Pain Assessment  Pain Assessment: Numeric Scale (0-10)  Pain Score: 2-mild pain          Objective:  Patient is in bed seen 2 Days Post-Op with dressings.    Gross ROM  Left Lower Extremity ROM:  (5-100-105)                                  Sensation is intact              Functional Mobility  Supine to Sit: Stand by assistance  Scooting: Independent  Sit to Supine: Stand by assistance  Sit to Stand: Min assist  Stand to Sit: Stand by assistance         Locomotion  Ambulation: minimal assistance; with son with front-wheeled walker  Ambulation Distance (Feet): 100 Feet   Pattern: R foot decreased clearance;L foot decreased clearance  Weight Bearing Status: Total  Weight Bearing Percent: 120   Stair Management: minimal assistance;one rail R;no rails;forward;step to pattern;with cane;with gait belt  Number of Stairs: 4     Therapeutic Exercise  Straight Leg Raise: 10  Quad Sets: 25  Heelslides: 25  Glute Sets: 25  Hip  Abduction: 25  Knee AROM Short Arc Quad: 25  Ankle Pumps: 25       Self Care and Home Management:  Rolling: Patient performed  independently, with verbal cues  Lower body dressing: Patient performed  with verbal cues, with stand-by assistance  Toilet transfer technique: Patient performed independently, with verbal cues, with adaptive equipment  Tub transfer technique: Patient performed  with verbal cues, with stand-by assistance, with adaptive equipment  Car transfer technique: Patient performed  with verbal cues    Educated the patient to role of physical therapy, plan of care, goals  of therapy and HEP, safety with mobility and ADLs, energy conservation techniques, weight bearing precautions, discharge instructions, home safety.    Patient is seated at edge of bed with all needs provided and call bell within reach. RN notified of session outcome, including patient cleared therapy goals for discharge home with family..    Assessment:   Assessment  Assessment: Decreased LE ROM;Decreased LE strength;Decreased endurance/activity tolerance;Gait impairment  Prognosis: Good;With continued PT status post acute discharge.  Goals per Eval/Re-eval:     Goals  Goal Formulation: With patient/family  Time for Goal Acheivement: 7 visits  Goals: Select goal  Pt Will Roll Left: Independently;Goal met  Pt Will Sit Edge of Bed: Independently;Goal met  Pt Will Stand: Independently;With stand by assist;Goal met  Pt Will Transfer Bed/Chair: With stand by assist;Goal met  Pt Will Transfer to Toilet: with rolling walker;With stand by assist;Partly met  Pt Will Transfer to Tub/Shower: with rolling walker;With stand by assist;Goal met  PT Will Demonstrate Car Transfer Technique: with rolling walker;With minimal assist;Goal met  Pt Will Ambulate: 101-150 feet;with rolling walker;With stand by assist;Goal met  Pt Will Go Up / Down Stairs: 3-5 stairs;by time of discharge;Goal met  Pt Will Perform Home Exer Program: With stand by  assist;Goal met  Pt Will Perform LE Dressing w/Device: At edge of bed;With min assist;Met          Plan:  Recommendation  Discharge Recommendation: Home with home health PT  DME Recommended for Discharge: Front wheel walker (3 in 1 commode)  PT - Next Visit Recommendation: 04/12/13  PT Frequency: 7x/wk    Discharge from  Acute Care Services.    Signature: Louann Liv, PTA 04/12/2013 1:11 PM

## 2013-04-12 NOTE — Plan of Care (Addendum)
Problem: Day 1 Post-op- Knee Surgery  Goal: Hemodynamic Stability  Outcome: Progressing  Pt. Alert and oriented x3, VSS, in no acute respiratory distress, minimal pain to his knee, unseatedy gait.  Goal: Mobility/activity is maintained at optimum level for patient  Outcome: Progressing  OOB but unsteady gait, bed side exercises reviewed, pain is manageable with Tramadol. Out to [facility] with 2 people assist.

## 2013-04-13 DIAGNOSIS — R350 Frequency of micturition: Secondary | ICD-10-CM | POA: Diagnosis not present

## 2013-04-13 DIAGNOSIS — M1990 Unspecified osteoarthritis, unspecified site: Secondary | ICD-10-CM | POA: Diagnosis not present

## 2013-04-13 DIAGNOSIS — R269 Unspecified abnormalities of gait and mobility: Secondary | ICD-10-CM | POA: Diagnosis not present

## 2013-04-13 DIAGNOSIS — Z471 Aftercare following joint replacement surgery: Secondary | ICD-10-CM | POA: Diagnosis not present

## 2013-04-13 DIAGNOSIS — E785 Hyperlipidemia, unspecified: Secondary | ICD-10-CM | POA: Diagnosis not present

## 2013-04-13 DIAGNOSIS — N401 Enlarged prostate with lower urinary tract symptoms: Secondary | ICD-10-CM | POA: Diagnosis not present

## 2013-04-13 DIAGNOSIS — M545 Low back pain: Secondary | ICD-10-CM | POA: Diagnosis not present

## 2013-04-13 NOTE — Discharge Summary (Signed)
Discharge Summary    Patient ID:  George King  [id number]  79 y.o.  December 26, 1925    Admit date: 04/10/2013    Discharge date and time: 04/12/2013  1:57 PM     Admitting Physician: Henderson Baltimore, MD     Admission Diagnoses: Left knee osteoarthritis    Discharge Diagnoses: Left knee osteoarthritis    Operative Procedures: Left total knee arthroplasty    Indication for Admission: The patient has a history of left knee osteoarthritis that has become progressively worse and the patient has elected to proceed with left total knee arthroplasty.    Hospital Course: The patient was admitted to the hospital on 04/10/2013 and underwent left total knee arthroplasty. Post-operatively, the patient was transferred to the orthopaedic floor.  They received 24 hours of prophylactic intravenous antibiotics.  DVT prophylaxis included SCDs, early ambulation, and Aspirin was started the evening of surgery.  The patient was able to tolerate a regular diet and their pain was reasonably well controlled.  The patient progressed well throughout the hospitalization and was deemed stable for discharge on 04/12/2013  1:57 PM.    Disposition: Home or Self Care    Discharge Medications:       Medication List       As of 04/13/2013 11:01 AM      START taking these medications           aspirin 325 MG tablet    Take 1 tablet (325 mg total) by mouth every 12 (twelve) hours.    Replaces: aspirin EC 81 MG EC tablet        celecoxib 100 MG capsule    Commonly known as: CeleBREX    Take 2 capsules (200 mg total) by mouth daily.        docusate sodium 100 MG capsule    Commonly known as: COLACE    Take 1 capsule (100 mg total) by mouth 2 (two) times daily.        HYDROcodone-acetaminophen 5-325 MG per tablet    Commonly known as: NORCO    Take 1-2 tablets by mouth every 4 (four) hours as needed for Pain.        iron-vitamin C 65-125 MG Tabs    Commonly known as: VITRON C    Take 1 tablet by mouth daily.        traMADol 50 MG tablet    Commonly known as:  ULTRAM    Take 1 tablet (50 mg total) by mouth every 6 (six) hours as needed for Pain.         CONTINUE taking these medications           AZOR 5-20 MG per tablet    Generic drug: amlodipine-olmesartan        clobetasol 0.05 % cream    Commonly known as: TEMOVATE        CoQ10 100 MG Caps        mometasone 50 MCG/ACT nasal spray    Commonly known as: NASONEX        Saw Palmetto Caps        Selenium 100 MCG Caps        simvastatin 20 MG tablet    Commonly known as: ZOCOR        THERA TEARS NUTRITION PO        vitamin C 500 MG tablet        VITAMIN D PO        vitamin E  200 UNIT capsule        Zinc 30 MG Caps         STOP taking these medications           ALEVE 220 MG tablet    Generic drug: naproxen sodium        aspirin EC 81 MG EC tablet    Replaced by: aspirin 325 MG tablet        mupirocin 2 % nasal ointment    Commonly known as: BACTROBAN NASAL               Where to get your medications         Information on where to get these meds is not yet available. Ask your nurse or doctor.           aspirin 325 MG tablet    celecoxib 100 MG capsule    docusate sodium 100 MG capsule    HYDROcodone-acetaminophen 5-325 MG per tablet    iron-vitamin C 65-125 MG Tabs    traMADol 50 MG tablet                   Patient Instructions:     The patient will notify me for any increased bleeding, drainage, or progressively worsening pain, or other concerning symptoms.  They have been instructed to report to the emergency room immediately for any chest pain or shortness of breath.     Activity Precautions: WBAT on LLE.  Work on obtaining full extension and maximizing your flexion post-operatively with PT.      Wound Care: The patient may shower when they get home and pad the incision dry afterwards.  The patient should re-dress the incision with a clean dry dressing to be changed daily until the staples are removed.    DVT prophylaxis: Aspirin 325mg  PO daily for 4 weeks.    Follow-up visit at [facility] 4 weeks  post-op.    Signed:  Robb Matar  04/13/2013  11:01 AM

## 2013-04-14 DIAGNOSIS — R269 Unspecified abnormalities of gait and mobility: Secondary | ICD-10-CM | POA: Diagnosis not present

## 2013-04-14 DIAGNOSIS — M545 Low back pain: Secondary | ICD-10-CM | POA: Diagnosis not present

## 2013-04-14 DIAGNOSIS — E785 Hyperlipidemia, unspecified: Secondary | ICD-10-CM | POA: Diagnosis not present

## 2013-04-14 DIAGNOSIS — N401 Enlarged prostate with lower urinary tract symptoms: Secondary | ICD-10-CM | POA: Diagnosis not present

## 2013-04-14 DIAGNOSIS — Z471 Aftercare following joint replacement surgery: Secondary | ICD-10-CM | POA: Diagnosis not present

## 2013-04-14 DIAGNOSIS — M1990 Unspecified osteoarthritis, unspecified site: Secondary | ICD-10-CM | POA: Diagnosis not present

## 2013-04-16 DIAGNOSIS — R269 Unspecified abnormalities of gait and mobility: Secondary | ICD-10-CM | POA: Diagnosis not present

## 2013-04-16 DIAGNOSIS — M1990 Unspecified osteoarthritis, unspecified site: Secondary | ICD-10-CM | POA: Diagnosis not present

## 2013-04-16 DIAGNOSIS — N401 Enlarged prostate with lower urinary tract symptoms: Secondary | ICD-10-CM | POA: Diagnosis not present

## 2013-04-16 DIAGNOSIS — M545 Low back pain: Secondary | ICD-10-CM | POA: Diagnosis not present

## 2013-04-16 DIAGNOSIS — Z471 Aftercare following joint replacement surgery: Secondary | ICD-10-CM | POA: Diagnosis not present

## 2013-04-16 DIAGNOSIS — E785 Hyperlipidemia, unspecified: Secondary | ICD-10-CM | POA: Diagnosis not present

## 2013-04-18 DIAGNOSIS — R269 Unspecified abnormalities of gait and mobility: Secondary | ICD-10-CM | POA: Diagnosis not present

## 2013-04-18 DIAGNOSIS — M1990 Unspecified osteoarthritis, unspecified site: Secondary | ICD-10-CM | POA: Diagnosis not present

## 2013-04-18 DIAGNOSIS — Z471 Aftercare following joint replacement surgery: Secondary | ICD-10-CM | POA: Diagnosis not present

## 2013-04-18 DIAGNOSIS — N401 Enlarged prostate with lower urinary tract symptoms: Secondary | ICD-10-CM | POA: Diagnosis not present

## 2013-04-18 DIAGNOSIS — E785 Hyperlipidemia, unspecified: Secondary | ICD-10-CM | POA: Diagnosis not present

## 2013-04-18 DIAGNOSIS — M545 Low back pain: Secondary | ICD-10-CM | POA: Diagnosis not present

## 2013-04-19 DIAGNOSIS — N401 Enlarged prostate with lower urinary tract symptoms: Secondary | ICD-10-CM | POA: Diagnosis not present

## 2013-04-19 DIAGNOSIS — M1990 Unspecified osteoarthritis, unspecified site: Secondary | ICD-10-CM | POA: Diagnosis not present

## 2013-04-19 DIAGNOSIS — M545 Low back pain: Secondary | ICD-10-CM | POA: Diagnosis not present

## 2013-04-19 DIAGNOSIS — E785 Hyperlipidemia, unspecified: Secondary | ICD-10-CM | POA: Diagnosis not present

## 2013-04-19 DIAGNOSIS — Z471 Aftercare following joint replacement surgery: Secondary | ICD-10-CM | POA: Diagnosis not present

## 2013-04-19 DIAGNOSIS — R269 Unspecified abnormalities of gait and mobility: Secondary | ICD-10-CM | POA: Diagnosis not present

## 2013-04-20 DIAGNOSIS — N401 Enlarged prostate with lower urinary tract symptoms: Secondary | ICD-10-CM | POA: Diagnosis not present

## 2013-04-20 DIAGNOSIS — E785 Hyperlipidemia, unspecified: Secondary | ICD-10-CM | POA: Diagnosis not present

## 2013-04-20 DIAGNOSIS — M545 Low back pain: Secondary | ICD-10-CM | POA: Diagnosis not present

## 2013-04-20 DIAGNOSIS — Z471 Aftercare following joint replacement surgery: Secondary | ICD-10-CM | POA: Diagnosis not present

## 2013-04-20 DIAGNOSIS — R269 Unspecified abnormalities of gait and mobility: Secondary | ICD-10-CM | POA: Diagnosis not present

## 2013-04-20 DIAGNOSIS — M1990 Unspecified osteoarthritis, unspecified site: Secondary | ICD-10-CM | POA: Diagnosis not present

## 2013-04-21 DIAGNOSIS — R269 Unspecified abnormalities of gait and mobility: Secondary | ICD-10-CM | POA: Diagnosis not present

## 2013-04-21 DIAGNOSIS — M545 Low back pain: Secondary | ICD-10-CM | POA: Diagnosis not present

## 2013-04-21 DIAGNOSIS — M1990 Unspecified osteoarthritis, unspecified site: Secondary | ICD-10-CM | POA: Diagnosis not present

## 2013-04-21 DIAGNOSIS — E785 Hyperlipidemia, unspecified: Secondary | ICD-10-CM | POA: Diagnosis not present

## 2013-04-21 DIAGNOSIS — Z471 Aftercare following joint replacement surgery: Secondary | ICD-10-CM | POA: Diagnosis not present

## 2013-04-21 DIAGNOSIS — N401 Enlarged prostate with lower urinary tract symptoms: Secondary | ICD-10-CM | POA: Diagnosis not present

## 2013-04-23 ENCOUNTER — Encounter: Payer: Self-pay | Admitting: Adult Reconstructive Orthopaedic Surgery

## 2013-04-23 DIAGNOSIS — R269 Unspecified abnormalities of gait and mobility: Secondary | ICD-10-CM | POA: Diagnosis not present

## 2013-04-23 DIAGNOSIS — M1990 Unspecified osteoarthritis, unspecified site: Secondary | ICD-10-CM | POA: Diagnosis not present

## 2013-04-23 DIAGNOSIS — E785 Hyperlipidemia, unspecified: Secondary | ICD-10-CM | POA: Diagnosis not present

## 2013-04-23 DIAGNOSIS — M545 Low back pain: Secondary | ICD-10-CM | POA: Diagnosis not present

## 2013-04-23 DIAGNOSIS — N401 Enlarged prostate with lower urinary tract symptoms: Secondary | ICD-10-CM | POA: Diagnosis not present

## 2013-04-23 DIAGNOSIS — Z471 Aftercare following joint replacement surgery: Secondary | ICD-10-CM | POA: Diagnosis not present

## 2013-04-24 DIAGNOSIS — E785 Hyperlipidemia, unspecified: Secondary | ICD-10-CM | POA: Diagnosis not present

## 2013-04-24 DIAGNOSIS — R269 Unspecified abnormalities of gait and mobility: Secondary | ICD-10-CM | POA: Diagnosis not present

## 2013-04-24 DIAGNOSIS — N401 Enlarged prostate with lower urinary tract symptoms: Secondary | ICD-10-CM | POA: Diagnosis not present

## 2013-04-24 DIAGNOSIS — M545 Low back pain: Secondary | ICD-10-CM | POA: Diagnosis not present

## 2013-04-24 DIAGNOSIS — Z471 Aftercare following joint replacement surgery: Secondary | ICD-10-CM | POA: Diagnosis not present

## 2013-04-24 DIAGNOSIS — M1990 Unspecified osteoarthritis, unspecified site: Secondary | ICD-10-CM | POA: Diagnosis not present

## 2013-04-25 DIAGNOSIS — R269 Unspecified abnormalities of gait and mobility: Secondary | ICD-10-CM | POA: Diagnosis not present

## 2013-04-25 DIAGNOSIS — E785 Hyperlipidemia, unspecified: Secondary | ICD-10-CM | POA: Diagnosis not present

## 2013-04-25 DIAGNOSIS — Z471 Aftercare following joint replacement surgery: Secondary | ICD-10-CM | POA: Diagnosis not present

## 2013-04-25 DIAGNOSIS — N401 Enlarged prostate with lower urinary tract symptoms: Secondary | ICD-10-CM | POA: Diagnosis not present

## 2013-04-25 DIAGNOSIS — M545 Low back pain: Secondary | ICD-10-CM | POA: Diagnosis not present

## 2013-04-25 DIAGNOSIS — M1990 Unspecified osteoarthritis, unspecified site: Secondary | ICD-10-CM | POA: Diagnosis not present

## 2013-04-26 DIAGNOSIS — M545 Low back pain: Secondary | ICD-10-CM | POA: Diagnosis not present

## 2013-04-26 DIAGNOSIS — N401 Enlarged prostate with lower urinary tract symptoms: Secondary | ICD-10-CM | POA: Diagnosis not present

## 2013-04-26 DIAGNOSIS — M1990 Unspecified osteoarthritis, unspecified site: Secondary | ICD-10-CM | POA: Diagnosis not present

## 2013-04-26 DIAGNOSIS — Z471 Aftercare following joint replacement surgery: Secondary | ICD-10-CM | POA: Diagnosis not present

## 2013-04-26 DIAGNOSIS — E785 Hyperlipidemia, unspecified: Secondary | ICD-10-CM | POA: Diagnosis not present

## 2013-04-26 DIAGNOSIS — R269 Unspecified abnormalities of gait and mobility: Secondary | ICD-10-CM | POA: Diagnosis not present

## 2013-04-27 DIAGNOSIS — M545 Low back pain: Secondary | ICD-10-CM | POA: Diagnosis not present

## 2013-04-27 DIAGNOSIS — N401 Enlarged prostate with lower urinary tract symptoms: Secondary | ICD-10-CM | POA: Diagnosis not present

## 2013-04-27 DIAGNOSIS — R269 Unspecified abnormalities of gait and mobility: Secondary | ICD-10-CM | POA: Diagnosis not present

## 2013-04-27 DIAGNOSIS — M1990 Unspecified osteoarthritis, unspecified site: Secondary | ICD-10-CM | POA: Diagnosis not present

## 2013-04-27 DIAGNOSIS — Z471 Aftercare following joint replacement surgery: Secondary | ICD-10-CM | POA: Diagnosis not present

## 2013-04-27 DIAGNOSIS — E785 Hyperlipidemia, unspecified: Secondary | ICD-10-CM | POA: Diagnosis not present

## 2013-04-29 DIAGNOSIS — E785 Hyperlipidemia, unspecified: Secondary | ICD-10-CM | POA: Diagnosis not present

## 2013-04-29 DIAGNOSIS — N401 Enlarged prostate with lower urinary tract symptoms: Secondary | ICD-10-CM | POA: Diagnosis not present

## 2013-04-29 DIAGNOSIS — R269 Unspecified abnormalities of gait and mobility: Secondary | ICD-10-CM | POA: Diagnosis not present

## 2013-04-29 DIAGNOSIS — M1990 Unspecified osteoarthritis, unspecified site: Secondary | ICD-10-CM | POA: Diagnosis not present

## 2013-04-29 DIAGNOSIS — M545 Low back pain: Secondary | ICD-10-CM | POA: Diagnosis not present

## 2013-04-29 DIAGNOSIS — Z471 Aftercare following joint replacement surgery: Secondary | ICD-10-CM | POA: Diagnosis not present

## 2013-04-30 DIAGNOSIS — E785 Hyperlipidemia, unspecified: Secondary | ICD-10-CM | POA: Diagnosis not present

## 2013-04-30 DIAGNOSIS — M1990 Unspecified osteoarthritis, unspecified site: Secondary | ICD-10-CM | POA: Diagnosis not present

## 2013-04-30 DIAGNOSIS — N401 Enlarged prostate with lower urinary tract symptoms: Secondary | ICD-10-CM | POA: Diagnosis not present

## 2013-04-30 DIAGNOSIS — M545 Low back pain: Secondary | ICD-10-CM | POA: Diagnosis not present

## 2013-04-30 DIAGNOSIS — Z471 Aftercare following joint replacement surgery: Secondary | ICD-10-CM | POA: Diagnosis not present

## 2013-04-30 DIAGNOSIS — R269 Unspecified abnormalities of gait and mobility: Secondary | ICD-10-CM | POA: Diagnosis not present

## 2013-05-01 DIAGNOSIS — N401 Enlarged prostate with lower urinary tract symptoms: Secondary | ICD-10-CM | POA: Diagnosis not present

## 2013-05-01 DIAGNOSIS — M1990 Unspecified osteoarthritis, unspecified site: Secondary | ICD-10-CM | POA: Diagnosis not present

## 2013-05-01 DIAGNOSIS — R269 Unspecified abnormalities of gait and mobility: Secondary | ICD-10-CM | POA: Diagnosis not present

## 2013-05-01 DIAGNOSIS — M545 Low back pain: Secondary | ICD-10-CM | POA: Diagnosis not present

## 2013-05-01 DIAGNOSIS — E785 Hyperlipidemia, unspecified: Secondary | ICD-10-CM | POA: Diagnosis not present

## 2013-05-01 DIAGNOSIS — Z471 Aftercare following joint replacement surgery: Secondary | ICD-10-CM | POA: Diagnosis not present

## 2013-05-02 DIAGNOSIS — R269 Unspecified abnormalities of gait and mobility: Secondary | ICD-10-CM | POA: Diagnosis not present

## 2013-05-02 DIAGNOSIS — E785 Hyperlipidemia, unspecified: Secondary | ICD-10-CM | POA: Diagnosis not present

## 2013-05-02 DIAGNOSIS — M1990 Unspecified osteoarthritis, unspecified site: Secondary | ICD-10-CM | POA: Diagnosis not present

## 2013-05-02 DIAGNOSIS — Z471 Aftercare following joint replacement surgery: Secondary | ICD-10-CM | POA: Diagnosis not present

## 2013-05-02 DIAGNOSIS — N401 Enlarged prostate with lower urinary tract symptoms: Secondary | ICD-10-CM | POA: Diagnosis not present

## 2013-05-02 DIAGNOSIS — M545 Low back pain: Secondary | ICD-10-CM | POA: Diagnosis not present

## 2013-05-03 DIAGNOSIS — R269 Unspecified abnormalities of gait and mobility: Secondary | ICD-10-CM | POA: Diagnosis not present

## 2013-05-03 DIAGNOSIS — N401 Enlarged prostate with lower urinary tract symptoms: Secondary | ICD-10-CM | POA: Diagnosis not present

## 2013-05-03 DIAGNOSIS — M1990 Unspecified osteoarthritis, unspecified site: Secondary | ICD-10-CM | POA: Diagnosis not present

## 2013-05-03 DIAGNOSIS — Z471 Aftercare following joint replacement surgery: Secondary | ICD-10-CM | POA: Diagnosis not present

## 2013-05-03 DIAGNOSIS — E785 Hyperlipidemia, unspecified: Secondary | ICD-10-CM | POA: Diagnosis not present

## 2013-05-03 DIAGNOSIS — M545 Low back pain: Secondary | ICD-10-CM | POA: Diagnosis not present

## 2013-05-04 DIAGNOSIS — N401 Enlarged prostate with lower urinary tract symptoms: Secondary | ICD-10-CM | POA: Diagnosis not present

## 2013-05-04 DIAGNOSIS — E785 Hyperlipidemia, unspecified: Secondary | ICD-10-CM | POA: Diagnosis not present

## 2013-05-04 DIAGNOSIS — R269 Unspecified abnormalities of gait and mobility: Secondary | ICD-10-CM | POA: Diagnosis not present

## 2013-05-04 DIAGNOSIS — Z471 Aftercare following joint replacement surgery: Secondary | ICD-10-CM | POA: Diagnosis not present

## 2013-05-04 DIAGNOSIS — M1990 Unspecified osteoarthritis, unspecified site: Secondary | ICD-10-CM | POA: Diagnosis not present

## 2013-05-04 DIAGNOSIS — M545 Low back pain: Secondary | ICD-10-CM | POA: Diagnosis not present

## 2013-05-07 DIAGNOSIS — Z471 Aftercare following joint replacement surgery: Secondary | ICD-10-CM | POA: Diagnosis not present

## 2013-05-07 DIAGNOSIS — Z96659 Presence of unspecified artificial knee joint: Secondary | ICD-10-CM | POA: Diagnosis not present

## 2013-05-07 DIAGNOSIS — M545 Low back pain: Secondary | ICD-10-CM | POA: Diagnosis not present

## 2013-05-07 DIAGNOSIS — N401 Enlarged prostate with lower urinary tract symptoms: Secondary | ICD-10-CM | POA: Diagnosis not present

## 2013-05-07 DIAGNOSIS — M1990 Unspecified osteoarthritis, unspecified site: Secondary | ICD-10-CM | POA: Diagnosis not present

## 2013-05-07 DIAGNOSIS — R269 Unspecified abnormalities of gait and mobility: Secondary | ICD-10-CM | POA: Diagnosis not present

## 2013-05-07 DIAGNOSIS — E785 Hyperlipidemia, unspecified: Secondary | ICD-10-CM | POA: Diagnosis not present

## 2013-05-08 DIAGNOSIS — I10 Essential (primary) hypertension: Secondary | ICD-10-CM | POA: Diagnosis not present

## 2013-05-08 DIAGNOSIS — E785 Hyperlipidemia, unspecified: Secondary | ICD-10-CM | POA: Diagnosis not present

## 2013-05-08 DIAGNOSIS — Z23 Encounter for immunization: Secondary | ICD-10-CM | POA: Diagnosis not present

## 2013-05-08 DIAGNOSIS — M1710 Unilateral primary osteoarthritis, unspecified knee: Secondary | ICD-10-CM | POA: Diagnosis not present

## 2013-05-08 DIAGNOSIS — I6529 Occlusion and stenosis of unspecified carotid artery: Secondary | ICD-10-CM | POA: Diagnosis not present

## 2013-05-09 DIAGNOSIS — R269 Unspecified abnormalities of gait and mobility: Secondary | ICD-10-CM | POA: Diagnosis not present

## 2013-05-09 DIAGNOSIS — N401 Enlarged prostate with lower urinary tract symptoms: Secondary | ICD-10-CM | POA: Diagnosis not present

## 2013-05-09 DIAGNOSIS — E785 Hyperlipidemia, unspecified: Secondary | ICD-10-CM | POA: Diagnosis not present

## 2013-05-09 DIAGNOSIS — Z471 Aftercare following joint replacement surgery: Secondary | ICD-10-CM | POA: Diagnosis not present

## 2013-05-09 DIAGNOSIS — M1990 Unspecified osteoarthritis, unspecified site: Secondary | ICD-10-CM | POA: Diagnosis not present

## 2013-05-09 DIAGNOSIS — M545 Low back pain: Secondary | ICD-10-CM | POA: Diagnosis not present

## 2013-05-10 DIAGNOSIS — N401 Enlarged prostate with lower urinary tract symptoms: Secondary | ICD-10-CM | POA: Diagnosis not present

## 2013-05-10 DIAGNOSIS — M1990 Unspecified osteoarthritis, unspecified site: Secondary | ICD-10-CM | POA: Diagnosis not present

## 2013-05-10 DIAGNOSIS — M545 Low back pain: Secondary | ICD-10-CM | POA: Diagnosis not present

## 2013-05-10 DIAGNOSIS — Z471 Aftercare following joint replacement surgery: Secondary | ICD-10-CM | POA: Diagnosis not present

## 2013-05-10 DIAGNOSIS — R269 Unspecified abnormalities of gait and mobility: Secondary | ICD-10-CM | POA: Diagnosis not present

## 2013-05-10 DIAGNOSIS — E785 Hyperlipidemia, unspecified: Secondary | ICD-10-CM | POA: Diagnosis not present

## 2013-05-12 DIAGNOSIS — R269 Unspecified abnormalities of gait and mobility: Secondary | ICD-10-CM | POA: Diagnosis not present

## 2013-05-12 DIAGNOSIS — M1990 Unspecified osteoarthritis, unspecified site: Secondary | ICD-10-CM | POA: Diagnosis not present

## 2013-05-12 DIAGNOSIS — E785 Hyperlipidemia, unspecified: Secondary | ICD-10-CM | POA: Diagnosis not present

## 2013-05-12 DIAGNOSIS — Z471 Aftercare following joint replacement surgery: Secondary | ICD-10-CM | POA: Diagnosis not present

## 2013-05-12 DIAGNOSIS — M545 Low back pain: Secondary | ICD-10-CM | POA: Diagnosis not present

## 2013-05-12 DIAGNOSIS — N401 Enlarged prostate with lower urinary tract symptoms: Secondary | ICD-10-CM | POA: Diagnosis not present

## 2013-05-15 DIAGNOSIS — M545 Low back pain: Secondary | ICD-10-CM | POA: Diagnosis not present

## 2013-05-15 DIAGNOSIS — M1990 Unspecified osteoarthritis, unspecified site: Secondary | ICD-10-CM | POA: Diagnosis not present

## 2013-05-15 DIAGNOSIS — E785 Hyperlipidemia, unspecified: Secondary | ICD-10-CM | POA: Diagnosis not present

## 2013-05-15 DIAGNOSIS — Z471 Aftercare following joint replacement surgery: Secondary | ICD-10-CM | POA: Diagnosis not present

## 2013-05-15 DIAGNOSIS — N401 Enlarged prostate with lower urinary tract symptoms: Secondary | ICD-10-CM | POA: Diagnosis not present

## 2013-05-15 DIAGNOSIS — R269 Unspecified abnormalities of gait and mobility: Secondary | ICD-10-CM | POA: Diagnosis not present

## 2013-05-16 DIAGNOSIS — N401 Enlarged prostate with lower urinary tract symptoms: Secondary | ICD-10-CM | POA: Diagnosis not present

## 2013-05-16 DIAGNOSIS — M1990 Unspecified osteoarthritis, unspecified site: Secondary | ICD-10-CM | POA: Diagnosis not present

## 2013-05-16 DIAGNOSIS — E785 Hyperlipidemia, unspecified: Secondary | ICD-10-CM | POA: Diagnosis not present

## 2013-05-16 DIAGNOSIS — R269 Unspecified abnormalities of gait and mobility: Secondary | ICD-10-CM | POA: Diagnosis not present

## 2013-05-16 DIAGNOSIS — M545 Low back pain: Secondary | ICD-10-CM | POA: Diagnosis not present

## 2013-05-16 DIAGNOSIS — Z471 Aftercare following joint replacement surgery: Secondary | ICD-10-CM | POA: Diagnosis not present

## 2013-05-17 DIAGNOSIS — M545 Low back pain: Secondary | ICD-10-CM | POA: Diagnosis not present

## 2013-05-17 DIAGNOSIS — E785 Hyperlipidemia, unspecified: Secondary | ICD-10-CM | POA: Diagnosis not present

## 2013-05-17 DIAGNOSIS — M1990 Unspecified osteoarthritis, unspecified site: Secondary | ICD-10-CM | POA: Diagnosis not present

## 2013-05-17 DIAGNOSIS — Z471 Aftercare following joint replacement surgery: Secondary | ICD-10-CM | POA: Diagnosis not present

## 2013-05-17 DIAGNOSIS — N401 Enlarged prostate with lower urinary tract symptoms: Secondary | ICD-10-CM | POA: Diagnosis not present

## 2013-05-17 DIAGNOSIS — R269 Unspecified abnormalities of gait and mobility: Secondary | ICD-10-CM | POA: Diagnosis not present

## 2013-05-18 DIAGNOSIS — E785 Hyperlipidemia, unspecified: Secondary | ICD-10-CM | POA: Diagnosis not present

## 2013-05-18 DIAGNOSIS — M545 Low back pain: Secondary | ICD-10-CM | POA: Diagnosis not present

## 2013-05-18 DIAGNOSIS — Z471 Aftercare following joint replacement surgery: Secondary | ICD-10-CM | POA: Diagnosis not present

## 2013-05-18 DIAGNOSIS — R269 Unspecified abnormalities of gait and mobility: Secondary | ICD-10-CM | POA: Diagnosis not present

## 2013-05-18 DIAGNOSIS — M1990 Unspecified osteoarthritis, unspecified site: Secondary | ICD-10-CM | POA: Diagnosis not present

## 2013-05-18 DIAGNOSIS — N401 Enlarged prostate with lower urinary tract symptoms: Secondary | ICD-10-CM | POA: Diagnosis not present

## 2013-05-21 DIAGNOSIS — M1710 Unilateral primary osteoarthritis, unspecified knee: Secondary | ICD-10-CM | POA: Diagnosis not present

## 2013-05-23 DIAGNOSIS — M1710 Unilateral primary osteoarthritis, unspecified knee: Secondary | ICD-10-CM | POA: Diagnosis not present

## 2013-05-24 DIAGNOSIS — M1710 Unilateral primary osteoarthritis, unspecified knee: Secondary | ICD-10-CM | POA: Diagnosis not present

## 2013-05-28 DIAGNOSIS — M1710 Unilateral primary osteoarthritis, unspecified knee: Secondary | ICD-10-CM | POA: Diagnosis not present

## 2013-05-30 DIAGNOSIS — M1710 Unilateral primary osteoarthritis, unspecified knee: Secondary | ICD-10-CM | POA: Diagnosis not present

## 2013-06-01 DIAGNOSIS — M1710 Unilateral primary osteoarthritis, unspecified knee: Secondary | ICD-10-CM | POA: Diagnosis not present

## 2013-06-04 DIAGNOSIS — M1710 Unilateral primary osteoarthritis, unspecified knee: Secondary | ICD-10-CM | POA: Diagnosis not present

## 2013-06-06 DIAGNOSIS — M1710 Unilateral primary osteoarthritis, unspecified knee: Secondary | ICD-10-CM | POA: Diagnosis not present

## 2013-06-08 DIAGNOSIS — I6529 Occlusion and stenosis of unspecified carotid artery: Secondary | ICD-10-CM | POA: Diagnosis not present

## 2013-06-08 DIAGNOSIS — M1710 Unilateral primary osteoarthritis, unspecified knee: Secondary | ICD-10-CM | POA: Diagnosis not present

## 2013-06-08 DIAGNOSIS — M5430 Sciatica, unspecified side: Secondary | ICD-10-CM | POA: Diagnosis not present

## 2013-06-08 DIAGNOSIS — I10 Essential (primary) hypertension: Secondary | ICD-10-CM | POA: Diagnosis not present

## 2013-06-11 DIAGNOSIS — M48061 Spinal stenosis, lumbar region without neurogenic claudication: Secondary | ICD-10-CM | POA: Diagnosis not present

## 2013-06-11 DIAGNOSIS — M1710 Unilateral primary osteoarthritis, unspecified knee: Secondary | ICD-10-CM | POA: Diagnosis not present

## 2013-06-13 DIAGNOSIS — M48061 Spinal stenosis, lumbar region without neurogenic claudication: Secondary | ICD-10-CM | POA: Diagnosis not present

## 2013-06-13 DIAGNOSIS — M1710 Unilateral primary osteoarthritis, unspecified knee: Secondary | ICD-10-CM | POA: Diagnosis not present

## 2013-06-18 DIAGNOSIS — M1710 Unilateral primary osteoarthritis, unspecified knee: Secondary | ICD-10-CM | POA: Diagnosis not present

## 2013-06-18 DIAGNOSIS — M48061 Spinal stenosis, lumbar region without neurogenic claudication: Secondary | ICD-10-CM | POA: Diagnosis not present

## 2013-06-21 DIAGNOSIS — M1710 Unilateral primary osteoarthritis, unspecified knee: Secondary | ICD-10-CM | POA: Diagnosis not present

## 2013-06-21 DIAGNOSIS — M48061 Spinal stenosis, lumbar region without neurogenic claudication: Secondary | ICD-10-CM | POA: Diagnosis not present

## 2013-06-25 DIAGNOSIS — M48061 Spinal stenosis, lumbar region without neurogenic claudication: Secondary | ICD-10-CM | POA: Diagnosis not present

## 2013-06-25 DIAGNOSIS — M1710 Unilateral primary osteoarthritis, unspecified knee: Secondary | ICD-10-CM | POA: Diagnosis not present

## 2013-06-28 DIAGNOSIS — M1710 Unilateral primary osteoarthritis, unspecified knee: Secondary | ICD-10-CM | POA: Diagnosis not present

## 2013-06-28 DIAGNOSIS — M48061 Spinal stenosis, lumbar region without neurogenic claudication: Secondary | ICD-10-CM | POA: Diagnosis not present

## 2013-07-27 DIAGNOSIS — M545 Low back pain, unspecified: Secondary | ICD-10-CM | POA: Diagnosis not present

## 2013-08-01 DIAGNOSIS — M545 Low back pain, unspecified: Secondary | ICD-10-CM | POA: Diagnosis not present

## 2013-08-08 DIAGNOSIS — M545 Low back pain, unspecified: Secondary | ICD-10-CM | POA: Diagnosis not present

## 2013-08-10 DIAGNOSIS — M545 Low back pain, unspecified: Secondary | ICD-10-CM | POA: Diagnosis not present

## 2013-08-13 ENCOUNTER — Other Ambulatory Visit: Payer: Self-pay | Admitting: Orthopaedic Surgery

## 2013-08-13 DIAGNOSIS — M545 Low back pain, unspecified: Secondary | ICD-10-CM | POA: Diagnosis not present

## 2013-08-13 DIAGNOSIS — M1710 Unilateral primary osteoarthritis, unspecified knee: Secondary | ICD-10-CM | POA: Diagnosis not present

## 2013-08-13 DIAGNOSIS — M48062 Spinal stenosis, lumbar region with neurogenic claudication: Secondary | ICD-10-CM

## 2013-08-13 DIAGNOSIS — I739 Peripheral vascular disease, unspecified: Secondary | ICD-10-CM

## 2013-08-14 ENCOUNTER — Other Ambulatory Visit: Payer: Self-pay | Admitting: Orthopaedic Surgery

## 2013-08-14 ENCOUNTER — Ambulatory Visit
Admission: RE | Admit: 2013-08-14 | Discharge: 2013-08-14 | Disposition: A | Discharge status: Home / Self Care | Payer: Medicare Other | Source: Ambulatory Visit | Attending: Orthopaedic Surgery | Admitting: Orthopaedic Surgery

## 2013-08-14 DIAGNOSIS — M545 Low back pain, unspecified: Secondary | ICD-10-CM | POA: Diagnosis not present

## 2013-08-14 DIAGNOSIS — M79609 Pain in unspecified limb: Secondary | ICD-10-CM | POA: Diagnosis not present

## 2013-08-14 DIAGNOSIS — I739 Peripheral vascular disease, unspecified: Secondary | ICD-10-CM

## 2013-08-16 DIAGNOSIS — M545 Low back pain, unspecified: Secondary | ICD-10-CM | POA: Diagnosis not present

## 2013-08-21 DIAGNOSIS — M545 Low back pain, unspecified: Secondary | ICD-10-CM | POA: Diagnosis not present

## 2013-08-23 DIAGNOSIS — M545 Low back pain, unspecified: Secondary | ICD-10-CM | POA: Diagnosis not present

## 2013-09-07 ENCOUNTER — Other Ambulatory Visit: Payer: Self-pay | Admitting: Specialist

## 2013-09-07 DIAGNOSIS — M48062 Spinal stenosis, lumbar region with neurogenic claudication: Secondary | ICD-10-CM | POA: Diagnosis not present

## 2013-09-07 DIAGNOSIS — M48061 Spinal stenosis, lumbar region without neurogenic claudication: Secondary | ICD-10-CM

## 2013-09-11 ENCOUNTER — Ambulatory Visit
Admission: RE | Admit: 2013-09-11 | Discharge: 2013-09-11 | Disposition: A | Discharge status: Home / Self Care | Payer: Medicare Other | Source: Ambulatory Visit | Attending: Specialist | Admitting: Specialist

## 2013-09-11 DIAGNOSIS — M48061 Spinal stenosis, lumbar region without neurogenic claudication: Secondary | ICD-10-CM

## 2013-09-18 ENCOUNTER — Ambulatory Visit
Admission: RE | Admit: 2013-09-18 | Discharge: 2013-09-18 | Disposition: A | Discharge status: Home / Self Care | Payer: Medicare Other | Source: Ambulatory Visit | Attending: Specialist | Admitting: Specialist

## 2013-09-18 DIAGNOSIS — M48061 Spinal stenosis, lumbar region without neurogenic claudication: Secondary | ICD-10-CM | POA: Diagnosis not present

## 2013-09-18 DIAGNOSIS — M47817 Spondylosis without myelopathy or radiculopathy, lumbosacral region: Secondary | ICD-10-CM | POA: Diagnosis not present

## 2013-09-25 DIAGNOSIS — M48061 Spinal stenosis, lumbar region without neurogenic claudication: Secondary | ICD-10-CM | POA: Diagnosis not present

## 2013-09-25 DIAGNOSIS — M5137 Other intervertebral disc degeneration, lumbosacral region: Secondary | ICD-10-CM | POA: Diagnosis not present

## 2013-09-26 ENCOUNTER — Ambulatory Visit
Admission: RE | Admit: 2013-09-26 | Discharge: 2013-09-26 | Disposition: A | Discharge status: Home / Self Care | Payer: Medicare Other | Source: Ambulatory Visit | Attending: Internal Medicine | Admitting: Internal Medicine

## 2013-09-26 DIAGNOSIS — R350 Frequency of micturition: Secondary | ICD-10-CM | POA: Diagnosis not present

## 2013-09-26 DIAGNOSIS — R7402 Elevation of levels of lactic acid dehydrogenase (LDH): Secondary | ICD-10-CM | POA: Diagnosis not present

## 2013-09-26 DIAGNOSIS — E538 Deficiency of other specified B group vitamins: Secondary | ICD-10-CM | POA: Diagnosis not present

## 2013-09-26 DIAGNOSIS — E559 Vitamin D deficiency, unspecified: Secondary | ICD-10-CM | POA: Diagnosis not present

## 2013-09-26 DIAGNOSIS — E119 Type 2 diabetes mellitus without complications: Secondary | ICD-10-CM | POA: Diagnosis not present

## 2013-09-26 DIAGNOSIS — E785 Hyperlipidemia, unspecified: Secondary | ICD-10-CM | POA: Diagnosis not present

## 2013-09-26 DIAGNOSIS — E039 Hypothyroidism, unspecified: Secondary | ICD-10-CM | POA: Diagnosis not present

## 2013-09-26 DIAGNOSIS — D509 Iron deficiency anemia, unspecified: Secondary | ICD-10-CM | POA: Diagnosis not present

## 2013-09-26 DIAGNOSIS — R7401 Elevation of levels of liver transaminase levels: Secondary | ICD-10-CM | POA: Insufficient documentation

## 2013-09-26 LAB — CBC AND DIFFERENTIAL
Basophils Absolute Automated: 0.04 10*3/uL (ref 0.00–0.20)
Basophils Automated: 1 %
Eosinophils Absolute Automated: 0.17 10*3/uL (ref 0.00–0.70)
Eosinophils Automated: 4 %
Hematocrit: 43.1 % (ref 42.0–52.0)
Hgb: 14.3 g/dL (ref 13.0–17.0)
Immature Granulocytes Absolute: 0.01 10*3/uL
Immature Granulocytes: 0 %
Lymphocytes Absolute Automated: 1.17 10*3/uL (ref 0.50–4.40)
Lymphocytes Automated: 26 %
MCH: 31.4 pg (ref 28.0–32.0)
MCHC: 33.2 g/dL (ref 32.0–36.0)
MCV: 94.5 fL (ref 80.0–100.0)
MPV: 10.3 fL (ref 9.4–12.3)
Monocytes Absolute Automated: 0.57 10*3/uL (ref 0.00–1.20)
Monocytes: 13 %
Neutrophils Absolute: 2.49 10*3/uL (ref 1.80–8.10)
Neutrophils: 56 %
Nucleated RBC: 0 (ref 0–1)
Platelets: 181 10*3/uL (ref 140–400)
RBC: 4.56 10*6/uL — ABNORMAL LOW (ref 4.70–6.00)
RDW: 13 % (ref 12–15)
WBC: 4.45 10*3/uL (ref 3.50–10.80)

## 2013-09-26 LAB — VITAMIN D,25 OH,TOTAL: Vitamin D, 25 OH, Total: 37 ng/mL (ref 30–100)

## 2013-09-26 LAB — COMPREHENSIVE METABOLIC PANEL
ALT: 20 U/L (ref 0–55)
AST (SGOT): 19 U/L (ref 5–34)
Albumin/Globulin Ratio: 1.4 (ref 0.9–2.2)
Albumin: 3.9 g/dL (ref 3.5–5.0)
Alkaline Phosphatase: 70 U/L (ref 40–150)
BUN: 17 mg/dL (ref 8.0–20.0)
Bilirubin, Total: 1.2 mg/dL (ref 0.1–1.2)
CO2: 26 mEq/L (ref 21–30)
Calcium: 9.9 mg/dL (ref 7.9–10.6)
Chloride: 107 mEq/L (ref 96–109)
Creatinine: 0.9 mg/dL (ref 0.5–1.5)
Globulin: 2.8 g/dL (ref 2.0–3.7)
Glucose: 123 mg/dL — ABNORMAL HIGH (ref 70–100)
Potassium: 3.8 mEq/L (ref 3.5–5.3)
Protein, Total: 6.7 g/dL (ref 6.0–8.3)
Sodium: 142 mEq/L (ref 135–146)

## 2013-09-26 LAB — PROSTATE SPECIFIC ANTIGEN FREE/TOTA (SOFT)
PSA Free and Total Ratio: 0.28
PSA, Free: 0.052 ng/mL (ref 0.000–0.500)
Prostate Specific Antigen, Total: 0.186 ng/mL (ref 0.000–4.000)

## 2013-09-26 LAB — LIPID PANEL
Cholesterol / HDL Ratio: 2
Cholesterol: 131 mg/dL (ref 0–199)
HDL: 64 mg/dL (ref >40)
LDL Calculated: 54 mg/dL (ref 0–99)
Triglycerides: 63 mg/dL (ref 34–149)
VLDL Calculated: 13 mg/dL (ref 10–40)

## 2013-09-26 LAB — VITAMIN B12: Vitamin B-12: 458 pg/mL (ref 211–911)

## 2013-09-26 LAB — GFR: EGFR: >60

## 2013-09-26 LAB — HEMOLYSIS INDEX: Hemolysis Index: 11 (ref 0–18)

## 2013-09-26 LAB — TSH: TSH: 1.59 (ref 0.35–4.94)

## 2013-09-26 LAB — HOMOCYSTEINE, SERUM: HOMOCYSTEINE: 8.71 umol/L (ref 5.08–15.39)

## 2013-09-26 LAB — HEMOGLOBIN A1C: Hemoglobin A1C: 5.6 % (ref 0.0–6.0)

## 2013-09-26 LAB — FOLATE: Folate: 17.6 ng/mL

## 2013-09-27 DIAGNOSIS — M48061 Spinal stenosis, lumbar region without neurogenic claudication: Secondary | ICD-10-CM | POA: Diagnosis not present

## 2013-09-27 DIAGNOSIS — M5430 Sciatica, unspecified side: Secondary | ICD-10-CM | POA: Diagnosis not present

## 2013-09-27 DIAGNOSIS — M5137 Other intervertebral disc degeneration, lumbosacral region: Secondary | ICD-10-CM | POA: Diagnosis not present

## 2013-09-27 DIAGNOSIS — M51379 Other intervertebral disc degeneration, lumbosacral region without mention of lumbar back pain or lower extremity pain: Secondary | ICD-10-CM | POA: Diagnosis not present

## 2013-09-27 DIAGNOSIS — IMO0002 Reserved for concepts with insufficient information to code with codable children: Secondary | ICD-10-CM | POA: Diagnosis not present

## 2013-10-02 DIAGNOSIS — I6529 Occlusion and stenosis of unspecified carotid artery: Secondary | ICD-10-CM | POA: Diagnosis not present

## 2013-10-02 DIAGNOSIS — I10 Essential (primary) hypertension: Secondary | ICD-10-CM | POA: Diagnosis not present

## 2013-10-02 DIAGNOSIS — E785 Hyperlipidemia, unspecified: Secondary | ICD-10-CM | POA: Diagnosis not present

## 2013-10-02 DIAGNOSIS — Z1211 Encounter for screening for malignant neoplasm of colon: Secondary | ICD-10-CM | POA: Diagnosis not present

## 2013-10-04 ENCOUNTER — Other Ambulatory Visit: Payer: Self-pay | Admitting: Internal Medicine

## 2013-10-04 ENCOUNTER — Ambulatory Visit
Admission: RE | Admit: 2013-10-04 | Discharge: 2013-10-04 | Disposition: A | Discharge status: Home / Self Care | Payer: Medicare Other | Source: Ambulatory Visit | Attending: Internal Medicine | Admitting: Internal Medicine

## 2013-10-04 DIAGNOSIS — R310 Gross hematuria: Secondary | ICD-10-CM | POA: Diagnosis not present

## 2013-10-04 DIAGNOSIS — I6529 Occlusion and stenosis of unspecified carotid artery: Secondary | ICD-10-CM

## 2013-10-04 LAB — URINALYSIS WITH MICROSCOPIC
Bilirubin, UA: NEGATIVE
Blood, UA: NEGATIVE
Glucose, UA: NEGATIVE
Leukocyte Esterase, UA: NEGATIVE
Nitrite, UA: NEGATIVE
Protein, UR: 30 — AB
Specific Gravity UA: 1.028 (ref 1.001–1.035)
Urine pH: 5.5 (ref 5.0–8.0)
Urobilinogen, UA: 0.2 (ref 0.2–2.0)

## 2013-10-05 ENCOUNTER — Ambulatory Visit: Payer: Self-pay

## 2013-10-05 DIAGNOSIS — R319 Hematuria, unspecified: Secondary | ICD-10-CM | POA: Diagnosis not present

## 2013-10-06 DIAGNOSIS — R319 Hematuria, unspecified: Secondary | ICD-10-CM | POA: Diagnosis not present

## 2013-10-07 DIAGNOSIS — R319 Hematuria, unspecified: Secondary | ICD-10-CM | POA: Diagnosis not present

## 2013-10-08 ENCOUNTER — Ambulatory Visit
Admission: RE | Admit: 2013-10-08 | Discharge: 2013-10-08 | Disposition: A | Discharge status: Home / Self Care | Payer: Medicare Other | Source: Ambulatory Visit | Attending: Internal Medicine | Admitting: Internal Medicine

## 2013-10-08 DIAGNOSIS — R82998 Other abnormal findings in urine: Secondary | ICD-10-CM | POA: Insufficient documentation

## 2013-10-08 DIAGNOSIS — R310 Gross hematuria: Secondary | ICD-10-CM | POA: Insufficient documentation

## 2013-10-10 ENCOUNTER — Ambulatory Visit
Admission: RE | Admit: 2013-10-10 | Discharge: 2013-10-10 | Disposition: A | Discharge status: Home / Self Care | Payer: Medicare Other | Source: Ambulatory Visit | Attending: Internal Medicine | Admitting: Internal Medicine

## 2013-10-10 DIAGNOSIS — I658 Occlusion and stenosis of other precerebral arteries: Secondary | ICD-10-CM | POA: Diagnosis not present

## 2013-10-10 DIAGNOSIS — I6529 Occlusion and stenosis of unspecified carotid artery: Secondary | ICD-10-CM | POA: Diagnosis not present

## 2013-10-10 DIAGNOSIS — Z9889 Other specified postprocedural states: Secondary | ICD-10-CM | POA: Diagnosis not present

## 2013-10-10 LAB — LAB USE ONLY - HISTORICAL NON-GYN MEDICAL CYTOLOGY

## 2013-10-16 ENCOUNTER — Ambulatory Visit
Admission: RE | Admit: 2013-10-16 | Discharge: 2013-10-16 | Disposition: A | Discharge status: Home / Self Care | Payer: Medicare Other | Source: Ambulatory Visit | Attending: Internal Medicine | Admitting: Internal Medicine

## 2013-10-16 ENCOUNTER — Other Ambulatory Visit: Payer: Self-pay | Admitting: Internal Medicine

## 2013-10-16 DIAGNOSIS — N1330 Unspecified hydronephrosis: Secondary | ICD-10-CM | POA: Diagnosis not present

## 2013-10-16 DIAGNOSIS — R310 Gross hematuria: Secondary | ICD-10-CM | POA: Diagnosis not present

## 2013-10-17 DIAGNOSIS — IMO0002 Reserved for concepts with insufficient information to code with codable children: Secondary | ICD-10-CM | POA: Diagnosis not present

## 2013-10-19 ENCOUNTER — Ambulatory Visit
Admission: RE | Admit: 2013-10-19 | Discharge: 2013-10-19 | Disposition: A | Discharge status: Home / Self Care | Payer: Medicare Other | Source: Ambulatory Visit | Attending: Internal Medicine | Admitting: Internal Medicine

## 2013-10-19 DIAGNOSIS — R310 Gross hematuria: Secondary | ICD-10-CM | POA: Diagnosis not present

## 2013-10-19 DIAGNOSIS — R319 Hematuria, unspecified: Secondary | ICD-10-CM | POA: Diagnosis not present

## 2013-10-23 LAB — LAB USE ONLY - HISTORICAL NON-GYN MEDICAL CYTOLOGY

## 2013-10-26 DIAGNOSIS — M48061 Spinal stenosis, lumbar region without neurogenic claudication: Secondary | ICD-10-CM | POA: Diagnosis not present

## 2013-10-26 DIAGNOSIS — M48062 Spinal stenosis, lumbar region with neurogenic claudication: Secondary | ICD-10-CM | POA: Diagnosis not present

## 2013-10-31 ENCOUNTER — Other Ambulatory Visit: Payer: Self-pay | Admitting: Urology

## 2013-10-31 DIAGNOSIS — R351 Nocturia: Secondary | ICD-10-CM | POA: Diagnosis not present

## 2013-10-31 DIAGNOSIS — N401 Enlarged prostate with lower urinary tract symptoms: Secondary | ICD-10-CM | POA: Diagnosis not present

## 2013-10-31 DIAGNOSIS — R310 Gross hematuria: Secondary | ICD-10-CM | POA: Diagnosis not present

## 2013-10-31 DIAGNOSIS — N1330 Unspecified hydronephrosis: Secondary | ICD-10-CM

## 2013-11-05 ENCOUNTER — Ambulatory Visit (HOSPITAL_BASED_OUTPATIENT_CLINIC_OR_DEPARTMENT_OTHER)
Admission: RE | Admit: 2013-11-05 | Discharge: 2013-11-05 | Disposition: A | Discharge status: Home / Self Care | Payer: Medicare Other | Source: Ambulatory Visit | Attending: Urology | Admitting: Urology

## 2013-11-05 ENCOUNTER — Ambulatory Visit
Admission: RE | Admit: 2013-11-05 | Discharge: 2013-11-05 | Disposition: A | Discharge status: Home / Self Care | Payer: Medicare Other | Source: Ambulatory Visit | Attending: Urology | Admitting: Urology

## 2013-11-05 DIAGNOSIS — K862 Cyst of pancreas: Secondary | ICD-10-CM | POA: Diagnosis not present

## 2013-11-05 DIAGNOSIS — K863 Pseudocyst of pancreas: Secondary | ICD-10-CM | POA: Diagnosis not present

## 2013-11-05 DIAGNOSIS — N281 Cyst of kidney, acquired: Secondary | ICD-10-CM | POA: Diagnosis not present

## 2013-11-05 DIAGNOSIS — R319 Hematuria, unspecified: Secondary | ICD-10-CM | POA: Diagnosis not present

## 2013-11-05 DIAGNOSIS — R310 Gross hematuria: Secondary | ICD-10-CM | POA: Diagnosis not present

## 2013-11-05 DIAGNOSIS — N1330 Unspecified hydronephrosis: Secondary | ICD-10-CM

## 2013-11-05 LAB — GFR: EGFR: >60

## 2013-11-05 LAB — CREATININE, SERUM: Creatinine: 0.9 mg/dL (ref 0.7–1.3)

## 2013-11-05 MED ORDER — IOHEXOL 350 MG/ML IV SOLN
INTRAVENOUS | Status: AC
Start: 2013-11-05 — End: 2013-11-05
  Administered 2013-11-05: 100 mL via INTRAVENOUS
  Filled 2013-11-05: qty 100

## 2013-11-05 MED ORDER — IOHEXOL 350 MG/ML IV SOLN
100.0000 mL | Freq: Once | INTRAVENOUS | Status: AC | PRN
Start: 2013-11-05 — End: 2013-11-05

## 2013-11-16 DIAGNOSIS — IMO0002 Reserved for concepts with insufficient information to code with codable children: Secondary | ICD-10-CM | POA: Diagnosis not present

## 2013-11-16 DIAGNOSIS — M545 Low back pain, unspecified: Secondary | ICD-10-CM | POA: Diagnosis not present

## 2013-12-13 DIAGNOSIS — M48061 Spinal stenosis, lumbar region without neurogenic claudication: Secondary | ICD-10-CM | POA: Diagnosis not present

## 2013-12-13 DIAGNOSIS — M545 Low back pain, unspecified: Secondary | ICD-10-CM | POA: Diagnosis not present

## 2013-12-13 DIAGNOSIS — IMO0002 Reserved for concepts with insufficient information to code with codable children: Secondary | ICD-10-CM | POA: Diagnosis not present

## 2013-12-21 DIAGNOSIS — IMO0002 Reserved for concepts with insufficient information to code with codable children: Secondary | ICD-10-CM | POA: Diagnosis not present

## 2013-12-21 DIAGNOSIS — M545 Low back pain, unspecified: Secondary | ICD-10-CM | POA: Diagnosis not present

## 2013-12-21 DIAGNOSIS — M48061 Spinal stenosis, lumbar region without neurogenic claudication: Secondary | ICD-10-CM | POA: Diagnosis not present

## 2014-01-02 DIAGNOSIS — L570 Actinic keratosis: Secondary | ICD-10-CM | POA: Diagnosis not present

## 2014-01-02 DIAGNOSIS — I10 Essential (primary) hypertension: Secondary | ICD-10-CM | POA: Diagnosis not present

## 2014-01-02 DIAGNOSIS — R310 Gross hematuria: Secondary | ICD-10-CM | POA: Diagnosis not present

## 2014-01-02 DIAGNOSIS — Z23 Encounter for immunization: Secondary | ICD-10-CM | POA: Diagnosis not present

## 2014-01-07 DIAGNOSIS — R310 Gross hematuria: Secondary | ICD-10-CM | POA: Diagnosis not present

## 2014-01-15 DIAGNOSIS — M545 Low back pain, unspecified: Secondary | ICD-10-CM | POA: Diagnosis not present

## 2014-01-15 DIAGNOSIS — M48061 Spinal stenosis, lumbar region without neurogenic claudication: Secondary | ICD-10-CM | POA: Diagnosis not present

## 2014-01-15 DIAGNOSIS — IMO0002 Reserved for concepts with insufficient information to code with codable children: Secondary | ICD-10-CM | POA: Diagnosis not present

## 2014-01-30 DIAGNOSIS — M545 Low back pain, unspecified: Secondary | ICD-10-CM | POA: Diagnosis not present

## 2014-01-30 DIAGNOSIS — M48061 Spinal stenosis, lumbar region without neurogenic claudication: Secondary | ICD-10-CM | POA: Diagnosis not present

## 2014-01-30 DIAGNOSIS — IMO0002 Reserved for concepts with insufficient information to code with codable children: Secondary | ICD-10-CM | POA: Diagnosis not present

## 2014-02-19 DIAGNOSIS — M545 Low back pain, unspecified: Secondary | ICD-10-CM | POA: Diagnosis not present

## 2014-02-19 DIAGNOSIS — M48061 Spinal stenosis, lumbar region without neurogenic claudication: Secondary | ICD-10-CM | POA: Diagnosis not present

## 2014-02-26 ENCOUNTER — Encounter: Payer: Medicare Other | Admitting: Vascular Surgery

## 2014-02-26 ENCOUNTER — Other Ambulatory Visit: Payer: Self-pay | Admitting: *Deleted

## 2014-02-26 ENCOUNTER — Encounter (HOSPITAL_COMMUNITY): Payer: Medicare Other

## 2014-02-26 DIAGNOSIS — M79604 Pain in right leg: Secondary | ICD-10-CM

## 2014-02-28 ENCOUNTER — Encounter: Payer: Self-pay | Admitting: Surgery

## 2014-03-05 ENCOUNTER — Encounter: Payer: Self-pay | Admitting: Surgery

## 2014-03-06 ENCOUNTER — Ambulatory Visit (HOSPITAL_COMMUNITY)
Admission: RE | Admit: 2014-03-06 | Discharge: 2014-03-06 | Disposition: A | Discharge status: Home / Self Care | Payer: Medicare Other | Source: Ambulatory Visit | Attending: Surgery | Admitting: Surgery

## 2014-03-06 ENCOUNTER — Encounter: Payer: Self-pay | Admitting: Surgery

## 2014-03-06 ENCOUNTER — Ambulatory Visit (INDEPENDENT_AMBULATORY_CARE_PROVIDER_SITE_OTHER): Payer: Medicare Other | Admitting: Surgery

## 2014-03-06 VITALS — BP 159/78 | HR 48 | Resp 16 | Ht 71.0 in | Wt 185.0 lb

## 2014-03-06 DIAGNOSIS — I739 Peripheral vascular disease, unspecified: Secondary | ICD-10-CM | POA: Diagnosis not present

## 2014-03-06 DIAGNOSIS — M79609 Pain in unspecified limb: Secondary | ICD-10-CM

## 2014-03-06 DIAGNOSIS — M25569 Pain in unspecified knee: Secondary | ICD-10-CM | POA: Diagnosis not present

## 2014-03-06 DIAGNOSIS — M25561 Pain in right knee: Secondary | ICD-10-CM

## 2014-03-06 DIAGNOSIS — M79604 Pain in right leg: Secondary | ICD-10-CM

## 2014-03-06 NOTE — Progress Notes (Signed)
Patient name: William Skinner MRN: [id number] DOB: 07-Nov-1925 Sex: male   Referred by: Dr. Ron Agee  Reason for referral:  Chief Complaint  Patient presents with  . New Evaluation    right leg pain    HISTORY OF PRESENT ILLNESS: This is a very pleasant 80 year old gentleman who is referred today for evaluation of right leg pain.  The patient states that he began having right leg pain several months ago, after his left knee replacement surgery.  He states that it is worse when he gets up in the morning.  It gives a little but better as the day goes on.  It is in the cath and knee region.  It does radiate down to his foot and out onto his toes.  This patient suffers from hypercholesterolemia which is treated with a statin.  He is also medically managed for hypertension.  He denies a history of heart disease.  Past Medical History  Diagnosis Date  . Back pain   . Right leg pain   . Hypertension   . Hyperlipidemia   . Carotid artery occlusion     Past Surgical History  Procedure Laterality Date  . Hernia repair    . Nose surgery    . Cataract extraction Bilateral   . Carotid endarterectomy Left   . Knee surgery Left     History   Social History  . Marital Status: Widowed    Spouse Name: N/A    Number of Children: N/A  . Years of Education: N/A   Occupational History  . Not on file.   Social History Main Topics  . Smoking status: Former Smoker    Quit date: 03/06/1964  . Smokeless tobacco: Never Used  . Alcohol Use: Yes  . Drug Use: No  . Sexual Activity: Not on file   Other Topics Concern  . Not on file   Social History Narrative  . No narrative on file    History reviewed. No pertinent family history.  Allergies as of 03/06/2014  . (No Known Allergies)    No current outpatient prescriptions on file prior to visit.   No current facility-administered medications on file prior to visit.     REVIEW OF SYSTEMS: Cardiovascular: No chest pain,  chest pressure, palpitations, orthopnea, or dyspnea on exertion.  Right leg pain  No history of DVT or phlebitis. Pulmonary: No productive cough, asthma or wheezing. Neurologic: No weakness, paresthesias, aphasia, or amaurosis. No dizziness. Hematologic: No bleeding problems or clotting disorders. Musculoskeletal: No joint pain or joint swelling. Gastrointestinal: No blood in stool or hematemesis Genitourinary: No dysuria or hematuria. Psychiatric:: No history of major depression. Integumentary: No rashes or ulcers. Constitutional: No fever or chills.  PHYSICAL EXAMINATION: General: The patient appears their stated age.  Vital signs are BP 159/78  Pulse 48  Resp 16  Ht 5\' 11"  (1.803 m)  Wt 185 lb (83.915 kg)  BMI 25.81 kg/m2 HEENT:  No gross abnormalities Pulmonary: Respirations are non-labored Abdomen: Soft and non-tender .  Aorta is not palpable. Musculoskeletal: There are no major deformities.   Neurologic: No focal weakness or paresthesias are detected, Skin: There are no ulcer or rashes noted. Psychiatric: The patient has normal affect. Cardiovascular: There is a regular rate and rhythm without significant murmur appreciated.  Palpable dorsalis pedis and posterior tibial pulse on the right.  Palpable left posterior tibial pulse  Diagnostic Studies: I have ordered and reviewed his ankle-brachial indices which are normal.  The ABI  on the right is 1.12.  On the left is 1.04.  Both waveforms are triphasic.  The patient has a MRI which shows severe canal stenosis as well as L4-L5 disc disease.   Assessment:  Right leg pain Plan: I discussed the ultrasound findings as well as my physical examination with the patient.  He has palpable pulses and a normal ankle-brachial index.  For that reason I told him that his pain is not related to arterial insufficiency.  In addition, he has no swelling in his right leg, so I do not suspect a venous component to his complaint.  My suspicion is  that this is all related to his lower back.     Eldridge Abrahams, M.D. [facility] Office: [phone] Pager:  [phone]

## 2014-03-07 ENCOUNTER — Encounter: Payer: Medicare Other | Admitting: Vascular Surgery

## 2014-03-07 ENCOUNTER — Encounter (HOSPITAL_COMMUNITY): Payer: Medicare Other

## 2014-04-22 DIAGNOSIS — M25561 Pain in right knee: Secondary | ICD-10-CM | POA: Diagnosis not present

## 2014-04-22 DIAGNOSIS — M9906 Segmental and somatic dysfunction of lower extremity: Secondary | ICD-10-CM | POA: Diagnosis not present

## 2014-04-22 DIAGNOSIS — M5417 Radiculopathy, lumbosacral region: Secondary | ICD-10-CM | POA: Diagnosis not present

## 2014-04-22 DIAGNOSIS — M5137 Other intervertebral disc degeneration, lumbosacral region: Secondary | ICD-10-CM | POA: Diagnosis not present

## 2014-04-22 DIAGNOSIS — M9903 Segmental and somatic dysfunction of lumbar region: Secondary | ICD-10-CM | POA: Diagnosis not present

## 2014-04-22 DIAGNOSIS — M9905 Segmental and somatic dysfunction of pelvic region: Secondary | ICD-10-CM | POA: Diagnosis not present

## 2014-04-22 DIAGNOSIS — M9904 Segmental and somatic dysfunction of sacral region: Secondary | ICD-10-CM | POA: Diagnosis not present

## 2014-04-23 DIAGNOSIS — M4806 Spinal stenosis, lumbar region: Secondary | ICD-10-CM | POA: Diagnosis not present

## 2014-04-23 DIAGNOSIS — M545 Low back pain: Secondary | ICD-10-CM | POA: Diagnosis not present

## 2014-04-26 DIAGNOSIS — Z23 Encounter for immunization: Secondary | ICD-10-CM | POA: Diagnosis not present

## 2014-04-29 DIAGNOSIS — M9904 Segmental and somatic dysfunction of sacral region: Secondary | ICD-10-CM | POA: Diagnosis not present

## 2014-04-29 DIAGNOSIS — M25561 Pain in right knee: Secondary | ICD-10-CM | POA: Diagnosis not present

## 2014-04-29 DIAGNOSIS — M9905 Segmental and somatic dysfunction of pelvic region: Secondary | ICD-10-CM | POA: Diagnosis not present

## 2014-04-29 DIAGNOSIS — M5137 Other intervertebral disc degeneration, lumbosacral region: Secondary | ICD-10-CM | POA: Diagnosis not present

## 2014-04-29 DIAGNOSIS — M9903 Segmental and somatic dysfunction of lumbar region: Secondary | ICD-10-CM | POA: Diagnosis not present

## 2014-04-29 DIAGNOSIS — M9906 Segmental and somatic dysfunction of lower extremity: Secondary | ICD-10-CM | POA: Diagnosis not present

## 2014-04-29 DIAGNOSIS — M5417 Radiculopathy, lumbosacral region: Secondary | ICD-10-CM | POA: Diagnosis not present

## 2014-04-30 DIAGNOSIS — M9904 Segmental and somatic dysfunction of sacral region: Secondary | ICD-10-CM | POA: Diagnosis not present

## 2014-04-30 DIAGNOSIS — M5417 Radiculopathy, lumbosacral region: Secondary | ICD-10-CM | POA: Diagnosis not present

## 2014-04-30 DIAGNOSIS — M9906 Segmental and somatic dysfunction of lower extremity: Secondary | ICD-10-CM | POA: Diagnosis not present

## 2014-04-30 DIAGNOSIS — M5137 Other intervertebral disc degeneration, lumbosacral region: Secondary | ICD-10-CM | POA: Diagnosis not present

## 2014-04-30 DIAGNOSIS — M25561 Pain in right knee: Secondary | ICD-10-CM | POA: Diagnosis not present

## 2014-04-30 DIAGNOSIS — M9905 Segmental and somatic dysfunction of pelvic region: Secondary | ICD-10-CM | POA: Diagnosis not present

## 2014-04-30 DIAGNOSIS — M9903 Segmental and somatic dysfunction of lumbar region: Secondary | ICD-10-CM | POA: Diagnosis not present

## 2014-05-03 DIAGNOSIS — M5137 Other intervertebral disc degeneration, lumbosacral region: Secondary | ICD-10-CM | POA: Diagnosis not present

## 2014-05-03 DIAGNOSIS — M5417 Radiculopathy, lumbosacral region: Secondary | ICD-10-CM | POA: Diagnosis not present

## 2014-05-03 DIAGNOSIS — M9904 Segmental and somatic dysfunction of sacral region: Secondary | ICD-10-CM | POA: Diagnosis not present

## 2014-05-03 DIAGNOSIS — M9905 Segmental and somatic dysfunction of pelvic region: Secondary | ICD-10-CM | POA: Diagnosis not present

## 2014-05-03 DIAGNOSIS — M25561 Pain in right knee: Secondary | ICD-10-CM | POA: Diagnosis not present

## 2014-05-03 DIAGNOSIS — M9906 Segmental and somatic dysfunction of lower extremity: Secondary | ICD-10-CM | POA: Diagnosis not present

## 2014-05-03 DIAGNOSIS — M9903 Segmental and somatic dysfunction of lumbar region: Secondary | ICD-10-CM | POA: Diagnosis not present

## 2014-05-06 DIAGNOSIS — M5137 Other intervertebral disc degeneration, lumbosacral region: Secondary | ICD-10-CM | POA: Diagnosis not present

## 2014-05-06 DIAGNOSIS — M9905 Segmental and somatic dysfunction of pelvic region: Secondary | ICD-10-CM | POA: Diagnosis not present

## 2014-05-06 DIAGNOSIS — M9903 Segmental and somatic dysfunction of lumbar region: Secondary | ICD-10-CM | POA: Diagnosis not present

## 2014-05-06 DIAGNOSIS — M5417 Radiculopathy, lumbosacral region: Secondary | ICD-10-CM | POA: Diagnosis not present

## 2014-05-06 DIAGNOSIS — M9904 Segmental and somatic dysfunction of sacral region: Secondary | ICD-10-CM | POA: Diagnosis not present

## 2014-05-06 DIAGNOSIS — M25561 Pain in right knee: Secondary | ICD-10-CM | POA: Diagnosis not present

## 2014-05-06 DIAGNOSIS — M9906 Segmental and somatic dysfunction of lower extremity: Secondary | ICD-10-CM | POA: Diagnosis not present

## 2014-05-08 DIAGNOSIS — M9904 Segmental and somatic dysfunction of sacral region: Secondary | ICD-10-CM | POA: Diagnosis not present

## 2014-05-08 DIAGNOSIS — M5417 Radiculopathy, lumbosacral region: Secondary | ICD-10-CM | POA: Diagnosis not present

## 2014-05-08 DIAGNOSIS — M9903 Segmental and somatic dysfunction of lumbar region: Secondary | ICD-10-CM | POA: Diagnosis not present

## 2014-05-08 DIAGNOSIS — M5137 Other intervertebral disc degeneration, lumbosacral region: Secondary | ICD-10-CM | POA: Diagnosis not present

## 2014-05-08 DIAGNOSIS — M9906 Segmental and somatic dysfunction of lower extremity: Secondary | ICD-10-CM | POA: Diagnosis not present

## 2014-05-08 DIAGNOSIS — M9905 Segmental and somatic dysfunction of pelvic region: Secondary | ICD-10-CM | POA: Diagnosis not present

## 2014-05-08 DIAGNOSIS — M25561 Pain in right knee: Secondary | ICD-10-CM | POA: Diagnosis not present

## 2014-05-09 DIAGNOSIS — M9904 Segmental and somatic dysfunction of sacral region: Secondary | ICD-10-CM | POA: Diagnosis not present

## 2014-05-09 DIAGNOSIS — M9905 Segmental and somatic dysfunction of pelvic region: Secondary | ICD-10-CM | POA: Diagnosis not present

## 2014-05-09 DIAGNOSIS — M25561 Pain in right knee: Secondary | ICD-10-CM | POA: Diagnosis not present

## 2014-05-09 DIAGNOSIS — M9903 Segmental and somatic dysfunction of lumbar region: Secondary | ICD-10-CM | POA: Diagnosis not present

## 2014-05-09 DIAGNOSIS — M9906 Segmental and somatic dysfunction of lower extremity: Secondary | ICD-10-CM | POA: Diagnosis not present

## 2014-05-09 DIAGNOSIS — M5137 Other intervertebral disc degeneration, lumbosacral region: Secondary | ICD-10-CM | POA: Diagnosis not present

## 2014-05-09 DIAGNOSIS — M5417 Radiculopathy, lumbosacral region: Secondary | ICD-10-CM | POA: Diagnosis not present

## 2014-05-14 DIAGNOSIS — M9906 Segmental and somatic dysfunction of lower extremity: Secondary | ICD-10-CM | POA: Diagnosis not present

## 2014-05-14 DIAGNOSIS — M9903 Segmental and somatic dysfunction of lumbar region: Secondary | ICD-10-CM | POA: Diagnosis not present

## 2014-05-14 DIAGNOSIS — M5137 Other intervertebral disc degeneration, lumbosacral region: Secondary | ICD-10-CM | POA: Diagnosis not present

## 2014-05-14 DIAGNOSIS — M5417 Radiculopathy, lumbosacral region: Secondary | ICD-10-CM | POA: Diagnosis not present

## 2014-05-14 DIAGNOSIS — M25561 Pain in right knee: Secondary | ICD-10-CM | POA: Diagnosis not present

## 2014-05-14 DIAGNOSIS — M9904 Segmental and somatic dysfunction of sacral region: Secondary | ICD-10-CM | POA: Diagnosis not present

## 2014-05-14 DIAGNOSIS — M9905 Segmental and somatic dysfunction of pelvic region: Secondary | ICD-10-CM | POA: Diagnosis not present

## 2014-05-16 DIAGNOSIS — M5137 Other intervertebral disc degeneration, lumbosacral region: Secondary | ICD-10-CM | POA: Diagnosis not present

## 2014-05-16 DIAGNOSIS — M9906 Segmental and somatic dysfunction of lower extremity: Secondary | ICD-10-CM | POA: Diagnosis not present

## 2014-05-16 DIAGNOSIS — M5417 Radiculopathy, lumbosacral region: Secondary | ICD-10-CM | POA: Diagnosis not present

## 2014-05-16 DIAGNOSIS — M9904 Segmental and somatic dysfunction of sacral region: Secondary | ICD-10-CM | POA: Diagnosis not present

## 2014-05-16 DIAGNOSIS — M9905 Segmental and somatic dysfunction of pelvic region: Secondary | ICD-10-CM | POA: Diagnosis not present

## 2014-05-16 DIAGNOSIS — M9903 Segmental and somatic dysfunction of lumbar region: Secondary | ICD-10-CM | POA: Diagnosis not present

## 2014-05-16 DIAGNOSIS — M25561 Pain in right knee: Secondary | ICD-10-CM | POA: Diagnosis not present

## 2014-05-20 DIAGNOSIS — M9905 Segmental and somatic dysfunction of pelvic region: Secondary | ICD-10-CM | POA: Diagnosis not present

## 2014-05-20 DIAGNOSIS — M5417 Radiculopathy, lumbosacral region: Secondary | ICD-10-CM | POA: Diagnosis not present

## 2014-05-20 DIAGNOSIS — M9903 Segmental and somatic dysfunction of lumbar region: Secondary | ICD-10-CM | POA: Diagnosis not present

## 2014-05-20 DIAGNOSIS — M9904 Segmental and somatic dysfunction of sacral region: Secondary | ICD-10-CM | POA: Diagnosis not present

## 2014-05-20 DIAGNOSIS — M9906 Segmental and somatic dysfunction of lower extremity: Secondary | ICD-10-CM | POA: Diagnosis not present

## 2014-05-20 DIAGNOSIS — M5137 Other intervertebral disc degeneration, lumbosacral region: Secondary | ICD-10-CM | POA: Diagnosis not present

## 2014-05-20 DIAGNOSIS — M25561 Pain in right knee: Secondary | ICD-10-CM | POA: Diagnosis not present

## 2014-05-24 DIAGNOSIS — M5137 Other intervertebral disc degeneration, lumbosacral region: Secondary | ICD-10-CM | POA: Diagnosis not present

## 2014-05-24 DIAGNOSIS — M9903 Segmental and somatic dysfunction of lumbar region: Secondary | ICD-10-CM | POA: Diagnosis not present

## 2014-05-24 DIAGNOSIS — M9906 Segmental and somatic dysfunction of lower extremity: Secondary | ICD-10-CM | POA: Diagnosis not present

## 2014-05-24 DIAGNOSIS — M9905 Segmental and somatic dysfunction of pelvic region: Secondary | ICD-10-CM | POA: Diagnosis not present

## 2014-05-24 DIAGNOSIS — M9904 Segmental and somatic dysfunction of sacral region: Secondary | ICD-10-CM | POA: Diagnosis not present

## 2014-05-24 DIAGNOSIS — M25561 Pain in right knee: Secondary | ICD-10-CM | POA: Diagnosis not present

## 2014-05-24 DIAGNOSIS — M5417 Radiculopathy, lumbosacral region: Secondary | ICD-10-CM | POA: Diagnosis not present

## 2014-05-27 DIAGNOSIS — M4806 Spinal stenosis, lumbar region: Secondary | ICD-10-CM | POA: Diagnosis not present

## 2014-05-27 DIAGNOSIS — M545 Low back pain: Secondary | ICD-10-CM | POA: Diagnosis not present

## 2014-06-04 ENCOUNTER — Ambulatory Visit: Payer: Medicare Other | Attending: Physical Medicine and Rehabilitation

## 2014-06-04 DIAGNOSIS — M6281 Muscle weakness (generalized): Secondary | ICD-10-CM | POA: Diagnosis not present

## 2014-06-04 DIAGNOSIS — R5381 Other malaise: Secondary | ICD-10-CM | POA: Diagnosis not present

## 2014-06-04 DIAGNOSIS — R262 Difficulty in walking, not elsewhere classified: Secondary | ICD-10-CM | POA: Diagnosis not present

## 2014-06-04 DIAGNOSIS — Z6825 Body mass index (BMI) 25.0-25.9, adult: Secondary | ICD-10-CM | POA: Diagnosis not present

## 2014-06-04 DIAGNOSIS — M5416 Radiculopathy, lumbar region: Secondary | ICD-10-CM | POA: Diagnosis not present

## 2014-06-04 DIAGNOSIS — M4806 Spinal stenosis, lumbar region: Secondary | ICD-10-CM | POA: Insufficient documentation

## 2014-06-04 DIAGNOSIS — M545 Low back pain: Secondary | ICD-10-CM | POA: Diagnosis not present

## 2014-06-04 DIAGNOSIS — I10 Essential (primary) hypertension: Secondary | ICD-10-CM | POA: Diagnosis not present

## 2014-06-04 DIAGNOSIS — M4316 Spondylolisthesis, lumbar region: Secondary | ICD-10-CM | POA: Diagnosis not present

## 2014-06-06 ENCOUNTER — Ambulatory Visit: Payer: Medicare Other

## 2014-06-11 ENCOUNTER — Ambulatory Visit: Payer: Medicare Other

## 2014-06-11 DIAGNOSIS — M4806 Spinal stenosis, lumbar region: Secondary | ICD-10-CM | POA: Diagnosis not present

## 2014-06-18 ENCOUNTER — Ambulatory Visit: Payer: Medicare Other | Attending: Physical Medicine and Rehabilitation

## 2014-06-18 DIAGNOSIS — R5381 Other malaise: Secondary | ICD-10-CM | POA: Diagnosis not present

## 2014-06-18 DIAGNOSIS — R262 Difficulty in walking, not elsewhere classified: Secondary | ICD-10-CM | POA: Diagnosis not present

## 2014-06-18 DIAGNOSIS — M6281 Muscle weakness (generalized): Secondary | ICD-10-CM | POA: Diagnosis not present

## 2014-06-18 DIAGNOSIS — M4806 Spinal stenosis, lumbar region: Secondary | ICD-10-CM | POA: Diagnosis not present

## 2014-06-20 ENCOUNTER — Ambulatory Visit: Payer: Medicare Other | Admitting: Physical Therapy

## 2014-06-20 DIAGNOSIS — R262 Difficulty in walking, not elsewhere classified: Secondary | ICD-10-CM | POA: Diagnosis not present

## 2014-06-20 DIAGNOSIS — M6281 Muscle weakness (generalized): Secondary | ICD-10-CM | POA: Diagnosis not present

## 2014-06-20 DIAGNOSIS — M4806 Spinal stenosis, lumbar region: Secondary | ICD-10-CM | POA: Diagnosis not present

## 2014-06-20 DIAGNOSIS — R5381 Other malaise: Secondary | ICD-10-CM | POA: Diagnosis not present

## 2014-06-25 ENCOUNTER — Ambulatory Visit: Payer: Medicare Other | Admitting: Physical Therapy

## 2014-06-25 DIAGNOSIS — R262 Difficulty in walking, not elsewhere classified: Secondary | ICD-10-CM | POA: Diagnosis not present

## 2014-06-25 DIAGNOSIS — R5381 Other malaise: Secondary | ICD-10-CM | POA: Diagnosis not present

## 2014-06-25 DIAGNOSIS — M4806 Spinal stenosis, lumbar region: Secondary | ICD-10-CM | POA: Diagnosis not present

## 2014-06-25 DIAGNOSIS — M6281 Muscle weakness (generalized): Secondary | ICD-10-CM | POA: Diagnosis not present

## 2014-06-27 ENCOUNTER — Ambulatory Visit: Payer: Medicare Other

## 2014-06-27 DIAGNOSIS — M6281 Muscle weakness (generalized): Secondary | ICD-10-CM | POA: Diagnosis not present

## 2014-06-27 DIAGNOSIS — R262 Difficulty in walking, not elsewhere classified: Secondary | ICD-10-CM | POA: Diagnosis not present

## 2014-06-27 DIAGNOSIS — M4806 Spinal stenosis, lumbar region: Secondary | ICD-10-CM | POA: Diagnosis not present

## 2014-06-27 DIAGNOSIS — R5381 Other malaise: Secondary | ICD-10-CM | POA: Diagnosis not present

## 2014-07-02 ENCOUNTER — Ambulatory Visit: Payer: Medicare Other

## 2014-07-02 ENCOUNTER — Encounter: Payer: Medicare Other | Admitting: Physical Therapy

## 2014-07-17 DIAGNOSIS — M549 Dorsalgia, unspecified: Secondary | ICD-10-CM | POA: Diagnosis not present

## 2014-07-17 DIAGNOSIS — R350 Frequency of micturition: Secondary | ICD-10-CM | POA: Diagnosis not present

## 2014-07-17 DIAGNOSIS — R413 Other amnesia: Secondary | ICD-10-CM | POA: Diagnosis not present

## 2014-07-17 DIAGNOSIS — I10 Essential (primary) hypertension: Secondary | ICD-10-CM | POA: Diagnosis not present

## 2014-07-26 DIAGNOSIS — N508 Other specified disorders of male genital organs: Secondary | ICD-10-CM | POA: Diagnosis not present

## 2014-07-26 DIAGNOSIS — I10 Essential (primary) hypertension: Secondary | ICD-10-CM | POA: Diagnosis not present

## 2014-07-26 DIAGNOSIS — N644 Mastodynia: Secondary | ICD-10-CM | POA: Diagnosis not present

## 2014-07-26 DIAGNOSIS — M549 Dorsalgia, unspecified: Secondary | ICD-10-CM | POA: Diagnosis not present

## 2014-08-05 DIAGNOSIS — E229 Hyperfunction of pituitary gland, unspecified: Secondary | ICD-10-CM | POA: Diagnosis not present

## 2014-08-05 DIAGNOSIS — R062 Wheezing: Secondary | ICD-10-CM | POA: Diagnosis not present

## 2014-08-05 DIAGNOSIS — R899 Unspecified abnormal finding in specimens from other organs, systems and tissues: Secondary | ICD-10-CM | POA: Diagnosis not present

## 2015-01-13 DIAGNOSIS — R21 Rash and other nonspecific skin eruption: Secondary | ICD-10-CM | POA: Diagnosis not present

## 2015-01-13 DIAGNOSIS — Z0001 Encounter for general adult medical examination with abnormal findings: Secondary | ICD-10-CM | POA: Diagnosis not present

## 2015-01-13 DIAGNOSIS — I10 Essential (primary) hypertension: Secondary | ICD-10-CM | POA: Diagnosis not present

## 2015-01-13 DIAGNOSIS — E780 Pure hypercholesterolemia: Secondary | ICD-10-CM | POA: Diagnosis not present

## 2015-01-13 DIAGNOSIS — E221 Hyperprolactinemia: Secondary | ICD-10-CM | POA: Diagnosis not present

## 2015-01-13 DIAGNOSIS — R350 Frequency of micturition: Secondary | ICD-10-CM | POA: Diagnosis not present

## 2015-04-28 DIAGNOSIS — Z23 Encounter for immunization: Secondary | ICD-10-CM | POA: Diagnosis not present

## 2015-05-14 DIAGNOSIS — R350 Frequency of micturition: Secondary | ICD-10-CM | POA: Diagnosis not present

## 2015-05-14 DIAGNOSIS — R351 Nocturia: Secondary | ICD-10-CM | POA: Diagnosis not present

## 2015-06-18 DIAGNOSIS — R351 Nocturia: Secondary | ICD-10-CM | POA: Diagnosis not present

## 2015-06-18 DIAGNOSIS — R350 Frequency of micturition: Secondary | ICD-10-CM | POA: Diagnosis not present

## 2015-07-16 DIAGNOSIS — M5417 Radiculopathy, lumbosacral region: Secondary | ICD-10-CM | POA: Diagnosis not present

## 2015-07-16 DIAGNOSIS — M545 Low back pain: Secondary | ICD-10-CM | POA: Diagnosis not present

## 2015-07-29 ENCOUNTER — Ambulatory Visit: Payer: Medicare Other | Attending: Family Medicine

## 2015-07-29 DIAGNOSIS — R293 Abnormal posture: Secondary | ICD-10-CM | POA: Insufficient documentation

## 2015-07-29 DIAGNOSIS — R531 Weakness: Secondary | ICD-10-CM | POA: Diagnosis not present

## 2015-07-29 DIAGNOSIS — R262 Difficulty in walking, not elsewhere classified: Secondary | ICD-10-CM | POA: Insufficient documentation

## 2015-07-29 DIAGNOSIS — M5441 Lumbago with sciatica, right side: Secondary | ICD-10-CM

## 2015-07-29 NOTE — Therapy (Signed)
[facility] [address], [place], [place], [address] Phone: [phone]   Fax:  [phone]  Physical Therapy Evaluation  Patient Details  Name: William Skinner MRN: [id number] Date of Birth: October 11, 1925 Referring Provider: Harrington Challenger, C. Antony Haste, MD  Encounter Date: 07/29/2015      PT End of Session - 07/29/15 1040    Visit Number 1   Number of Visits 10   Date for PT Re-Evaluation 09/23/15   PT Start Time 1003   PT Stop Time [id number]   PT Time Calculation (min) 41 min   Activity Tolerance Patient tolerated treatment well   Behavior During Therapy [facility] for tasks assessed/performed      Past Medical History  Diagnosis Date  . Back pain   . Right leg pain   . Hypertension   . Hyperlipidemia   . Carotid artery occlusion     Past Surgical History  Procedure Laterality Date  . Hernia repair    . Nose surgery    . Cataract extraction Bilateral   . Carotid endarterectomy Left   . Knee surgery Left     There were no vitals filed for this visit.  Visit Diagnosis:  Bilateral low back pain with right-sided sciatica - Plan: PT plan of care cert/re-cert  Weakness generalized - Plan: PT plan of care cert/re-cert  Posture abnormality - Plan: PT plan of care cert/re-cert  Difficulty walking - Plan: PT plan of care cert/re-cert      Subjective Assessment - 07/29/15 1013    Subjective Pt is an 82 y.o. male who presents to PT with LBP and Rt LE pain that began 2 years ago with flare-up 6 months ago without cause.  Pt reports that MD says he needs surgery but he doesn't want to have surgery.     Pertinent History Lt TKA-3 years ago   Limitations Standing;Walking   How long can you sit comfortably? no limits   How long can you stand comfortably? 10 minutes    How long can you walk comfortably? < 5 minutes   Diagnostic tests none recent   Patient Stated Goals reduce Rt leg pain, return to walking in halls at home   Currently in Pain? Yes   Pain Score 5    Pain Location Back   Pain Orientation Right;Lower   Pain Descriptors / Indicators Aching   Pain Type Chronic pain   Pain Radiating Towards Rt LE   Pain Onset More than a month ago   Pain Frequency Intermittent   Aggravating Factors  standing and walking   Pain Relieving Factors sitting down, medication   Effect of Pain on Daily Activities not able to walk for > 5 minutes, poor standing posture            [facility] PT Assessment - 07/29/15 0001    Assessment   Medical Diagnosis Low back pain (M54.5)   Referring Provider Harrington Challenger, C. Antony Haste, MD   Onset Date/Surgical Date 07/28/13   Next MD Visit 6 months   Prior Therapy ~1.5 years ago    Precautions   Precautions Fall   Restrictions   Weight Bearing Restrictions No   Balance Screen   Has the patient fallen in the past 6 months Yes   How many times? 1  out of bed, not when walking   Has the patient had a decrease in activity level because of a fear of falling?  No   Is the patient reluctant to leave  their home because of a fear of falling?  No   Home Environment   Living Environment Assisted living  [facility] - 4 wheels   Prior Function   Level of Independence Independent   Vocation Retired   Leisure likes to walk, hasnt been able to due to pain in [place]   Overall Cognitive Status Within Functional Limits for tasks assessed   Memory Impaired   Observation/Other Assessments   Focus on Therapeutic Outcomes (FOTO)  65% limitation   Posture/Postural Control   Posture/Postural Control Postural limitations   Postural Limitations Forward head;Rounded Shoulders;Flexed trunk   ROM / Strength   AROM / PROM / Strength PROM;AROM;Strength   AROM   Overall AROM  Deficits   Overall AROM Comments Hip AROM is limited by ~50% with Rt LE pain reported with hamstring flexibilty testing on the Rt.  Lumbar AROM tested in sitting-WFL without pian   PROM   Overall  PROM  Deficits   Overall PROM Comments limited by 50% bilaterally    Strength   Overall Strength Deficits   Strength Assessment Site Hip;Knee;Ankle   Right/Left Hip Right;Left   Right Hip Flexion 4/5   Left Hip Flexion 4/5   Right/Left Knee Right;Left   Right Knee Flexion 4+/5   Right Knee Extension 4+/5   Left Knee Flexion 4+/5   Left Knee Extension 4+/5   Right/Left Ankle Right;Left   Right Ankle Dorsiflexion 4/5   Left Ankle Dorsiflexion 4/5   Palpation   Palpation comment palpable tenderness over bilateral lumbar parapsinals   Transfers   Transfers Sit to Stand;Stand to Sit   Sit to Stand 4: Min guard;With upper extremity assist;With armrests;Uncontrolled descent   Stand to Sit 4: Min guard;With upper extremity assist;3: Mod assist;With armrests;Uncontrolled descent   Stand to Sit Details difficulty maintaining balance upon standing   Ambulation/Gait   Ambulation/Gait Yes   Ambulation/Gait Assistance 5: Supervision   Ambulation Distance (Feet) 75 Feet   Assistive device 4-wheeled walker   Gait Pattern Step-to pattern;Decreased stride length;Decreased trunk rotation;Shuffle;Poor foot clearance - left;Poor foot clearance - right;Trunk flexed   Balance   Balance Assessed Yes   Standardized Balance Assessment   Standardized Balance Assessment Timed Up and Go Test   Timed Up and Go Test   TUG Normal TUG   Normal TUG (seconds) 35  chair with arm rests                           PT Education - 07/29/15 1038    Education provided Yes   Education Details single knee to chest, seated LE strength   Person(s) Educated Patient   Methods Demonstration;Explanation;Handout   Comprehension Verbalized understanding;Returned demonstration          PT Short Term Goals - 07/29/15 1056    PT SHORT TERM GOAL #1   Title be independent in iniital HEP   Time 4   Period Weeks   Status New   PT SHORT TERM GOAL #2   Title walk for 3-4 minutes in hallway without need  to rest due to fatigue or pain   Time 4   Period Weeks   Status New   PT SHORT TERM GOAL #3   Title improve LE strength to perform sit to stand without instability upon standing   Time 4   Period Weeks   Status New   PT SHORT TERM GOAL #  4   Title report a 25% reduction in Rt LE pain with ADLs and self-care   Time 4   Period Weeks   Status New   PT SHORT TERM GOAL #5   Title perform TUG in < or = to 30 seconds to improve balance and safety           PT Long Term Goals - Aug 27, 2015 1003    PT LONG TERM GOAL #1   Title be independent in advanced HEP   Time 8   Period Weeks   Status New   PT LONG TERM GOAL #2   Title reduce FOTO to < or = to 49% limitation   Time 8   Period Weeks   Status New   PT LONG TERM GOAL #3   Title perform TUG in < or = to 25 seconds to improve stability/balance   Time 8   Period Weeks   Status New   PT LONG TERM GOAL #4   Title report 50% reduction in Rt LE pain with ADLs and self-care   Time 8   Period Weeks   Status New   PT LONG TERM GOAL #5   Title walk for 5-7 minutes without need for rest due to fatigue or pain   Time 8   Period Weeks   Status New               Plan - August 27, 2015 1052    Clinical Impression Statement Pt is a 82 y.o. male who presents to PT with LBP and Rt LE pain of a chronic nature.  Pt reports that Rt leg pain significantly increased ~6 months ago and he has not been able to walk in the hallways at [facility] as he had been doing.  Pt demonstrates Rt LE pain with SLR, limited standing and walking endurance (5-10 minutes), TUG is 35 seconds, FOTO 65% limitation, poor standing posture especially with walking and max UE support and uncontrolled descent with stand to sit transition.  Pt will benefit from skilled PT for balance and LE strength training, flexibility progression, fall prevention education and modalities for pain.     Pt will benefit from skilled therapeutic intervention in order to improve on the  following deficits Abnormal gait;Decreased range of motion;Difficulty walking;Pain;Postural dysfunction;Decreased strength;Decreased mobility;Decreased balance;Improper body mechanics;Decreased activity tolerance;Decreased safety awareness;Decreased endurance   Rehab Potential Good   PT Frequency 2x / week   PT Duration 8 weeks   PT Treatment/Interventions ADLs/Self Care Home Management;Cryotherapy;Electrical Stimulation;Moist Heat;Therapeutic exercise;Therapeutic activities;Functional mobility training;Gait training;Ultrasound;Traction;Neuromuscular re-education;Patient/family education;Manual techniques;Passive range of motion   PT Next Visit Plan Fall prevention education, flexibility for hips, balance training, LE strength progression, modalities PRN   Consulted and Agree with Plan of Care Patient          G-Codes - August 27, 2015 1003    Functional Assessment Tool Used FOTO: 65% limitation   Functional Limitation Other PT primary   Other PT Primary Current Status [id number]) At least 60 percent but less than 80 percent impaired, limited or restricted   Other PT Primary Goal Status [id number]) At least 40 percent but less than 60 percent impaired, limited or restricted       Problem List Patient Active Problem List   Diagnosis Date Noted  . Pain in joint, lower leg 03/06/2014    Trezure Cronk, PT 2015-08-27, 11:12 AM  [facility] [address], [place], [place], [address] Phone: [phone]   Fax:  [phone]  Name: William Skinner MRN: [id number] Date of Birth: 02-15-1926

## 2015-07-29 NOTE — Patient Instructions (Signed)
KNEE: Extension, Long Arc Quad (Weight)  Place weight around leg. Raise leg until knee is straight. Hold _5__ seconds. Use ___ lb weight. _10__ reps per set (each leg), 4-5__ sets per day, __7_ days per week  Copyright  VHI. All rights reserved.   Knee Raise   Lift knee and then lower it. Repeat with other knee. Repeat _10__ times each leg. Do _4-5___ sessions per day.  http://gt2.exer.us/445   Copyright  VHI. All rights reserved.  Toe Up   Gently rise up on toes and back on heels. Repeat _20___ times. Do 4-5____ sessions per day.  Knee to Chest (Flexion)    Pull knee toward chest. Feel stretch in lower back or buttock area. Breathing deeply, Hold __20__ seconds. Repeat with other knee. Repeat _3___ times. Do __2__ sessions per day.  http://gt2.exer.us/225   Copyright  VHI. All rights reserved.    [place] [address] [place], [place] [address] Phone # [phone] Fax [phone]

## 2015-08-05 ENCOUNTER — Encounter: Payer: Self-pay | Admitting: Physical Therapy

## 2015-08-05 ENCOUNTER — Ambulatory Visit: Payer: Medicare Other | Admitting: Physical Therapy

## 2015-08-05 DIAGNOSIS — M5441 Lumbago with sciatica, right side: Secondary | ICD-10-CM

## 2015-08-05 DIAGNOSIS — R262 Difficulty in walking, not elsewhere classified: Secondary | ICD-10-CM | POA: Diagnosis not present

## 2015-08-05 DIAGNOSIS — R293 Abnormal posture: Secondary | ICD-10-CM | POA: Diagnosis not present

## 2015-08-05 DIAGNOSIS — R531 Weakness: Secondary | ICD-10-CM | POA: Diagnosis not present

## 2015-08-05 NOTE — Patient Instructions (Signed)

## 2015-08-05 NOTE — Therapy (Signed)
[facility] [address], [place], [place], [address] Phone: [phone]   Fax:  [phone]  Physical Therapy Treatment  Patient Details  Name: William Skinner MRN: [id number] Date of Birth: [birth year] Referring Provider: Harrington Challenger, C. Antony Haste, MD  Encounter Date: 08/05/2015      PT End of Session - 08/05/15 0955    Visit Number 2   Number of Visits 10   Date for PT Re-Evaluation 09/23/15   PT Start Time 0935   PT Stop Time 1018   PT Time Calculation (min) 43 min   Activity Tolerance Patient tolerated treatment well   Behavior During Therapy [facility] for tasks assessed/performed      Past Medical History  Diagnosis Date  . Back pain   . Right leg pain   . Hypertension   . Hyperlipidemia   . Carotid artery occlusion     Past Surgical History  Procedure Laterality Date  . Hernia repair    . Nose surgery    . Cataract extraction Bilateral   . Carotid endarterectomy Left   . Knee surgery Left     There were no vitals filed for this visit.  Visit Diagnosis:  Weakness generalized  Posture abnormality  Bilateral low back pain with right-sided sciatica  Difficulty walking      Subjective Assessment - 08/05/15 0950    Subjective Pt is a 82 y.o. male with LBP and Rt LE pain since 2 years. Pt feels he does not move enough and needs more activitiy   Pertinent History Lt TKA-3 years ago   Limitations Standing;Walking   How long can you sit comfortably? no limits   How long can you stand comfortably? 10 minutes    How long can you walk comfortably? < 5 minutes   Diagnostic tests none recent   Patient Stated Goals reduce Rt leg pain, return to walking in halls at home   Currently in Pain? Yes   Pain Score 3    Pain Location Back   Pain Orientation Right;Lower   Pain Descriptors / Indicators Aching   Pain Type Chronic pain   Pain Onset More than a month ago   Pain Frequency Intermittent   Aggravating Factors   weightbearing activities as standing and walking    Multiple Pain Sites No                         [facility] Adult PT Treatment/Exercise - 08/05/15 0001    Bed Mobility   Bed Mobility --  educated in fall prevention    Transfers   Transfers Sit to Stand;Stand to Sit   Comments from chair with arm rest    Ambulation/Gait   Ambulation/Gait Yes   Ambulation/Gait Assistance 5: Supervision   Ambulation Distance (Feet) 100 Feet   Assistive device 4-wheeled walker   Gait Pattern Step-to pattern;Decreased stride length;Decreased trunk rotation;Shuffle;Poor foot clearance - left;Poor foot clearance - right;Trunk flexed   Gait Comments pt with incr trufnk flexion   Posture/Postural Control   Posture/Postural Control Postural limitations   Postural Limitations Forward head;Rounded Shoulders;Flexed trunk   Exercises   Exercises Lumbar;Knee/Hip   Lumbar Exercises: Stretches   Active Hamstring Stretch 3 reps;20 seconds  in sitting   Lumbar Exercises: Standing   Other Standing Lumbar Exercises in front of mat table with small ball into available flexion for trunk extension  2 x 10 with close supervision for safety   Knee/Hip Exercises: Standing  Hip Flexion Stengthening;2 sets;10 reps  2# added, with B UE support anad close supervision   Hip Abduction Stengthening;2 sets;10 reps  2# added   Hip Extension Stengthening;2 sets;10 reps  2# added                PT Education - 08/05/15 1101    Education provided Yes   Education Details Fall prevention   Person(s) Educated Patient   Methods Explanation;Handout          PT Short Term Goals - 08/05/15 1002    PT SHORT TERM GOAL #1   Title be independent in iniital HEP   Time 4   Period Weeks   Status On-going   PT SHORT TERM GOAL #2   Title walk for 3-4 minutes in hallway without need to rest due to fatigue or pain   Time 4   Period Weeks   Status On-going   PT SHORT TERM GOAL #3   Title improve LE strength  to perform sit to stand without instability upon standing   Time 4   Period Weeks   Status On-going   PT SHORT TERM GOAL #4   Title report a 25% reduction in Rt LE pain with ADLs and self-care   Time 4   Period Weeks   Status On-going   PT SHORT TERM GOAL #5   Title perform TUG in < or = to 30 seconds to improve balance and safety   Time 4   Period Weeks   Status On-going           PT Long Term Goals - 07/29/15 1003    PT LONG TERM GOAL #1   Title be independent in advanced HEP   Time 8   Period Weeks   Status New   PT LONG TERM GOAL #2   Title reduce FOTO to < or = to 49% limitation   Time 8   Period Weeks   Status New   PT LONG TERM GOAL #3   Title perform TUG in < or = to 25 seconds to improve stability/balance   Time 8   Period Weeks   Status New   PT LONG TERM GOAL #4   Title report 50% reduction in Rt LE pain with ADLs and self-care   Time 8   Period Weeks   Status New   PT LONG TERM GOAL #5   Title walk for 5-7 minutes without need for rest due to fatigue or pain   Time 8   Period Weeks   Status New               Plan - 08/05/15 0955    Clinical Impression Statement Pt is a 22 y. o. male with Rt LE and LBP of chronic nature. Pt with increased hip flexion due to decrease iliopsoas and hamstring stretch. Pt will benefit from skilled PT for balance and LE strength training, flexibility progression, fall prevention education and modalities PRN    Pt will benefit from skilled therapeutic intervention in order to improve on the following deficits Abnormal gait;Decreased range of motion;Difficulty walking;Pain;Postural dysfunction;Decreased strength;Decreased mobility;Decreased balance;Improper body mechanics;Decreased activity tolerance;Decreased safety awareness;Decreased endurance   Rehab Potential Good   PT Frequency 2x / week   PT Duration 8 weeks   PT Treatment/Interventions ADLs/Self Care Home Management;Cryotherapy;Electrical Stimulation;Moist  Heat;Therapeutic exercise;Therapeutic activities;Functional mobility training;Gait training;Ultrasound;Traction;Neuromuscular re-education;Patient/family education;Manual techniques;Passive range of motion   PT Next Visit Plan Review fall flexibility for hips, balance training, LE  strength progression, modalities PRN   Consulted and Agree with Plan of Care Patient        Problem List Patient Active Problem List   Diagnosis Date Noted  . Pain in joint, lower leg 03/06/2014    NAUMANN-HOUEGNIFIO,Alexsis Kathman PTA 08/05/2015, 12:13 PM  [facility] [address] [place], [place], [address] Phone: [phone]   Fax:  [phone]  Name: William Skinner MRN: [id number] Date of Birth: [birth year]

## 2015-08-07 ENCOUNTER — Ambulatory Visit: Payer: Medicare Other | Admitting: Physical Therapy

## 2015-08-07 DIAGNOSIS — R293 Abnormal posture: Secondary | ICD-10-CM | POA: Diagnosis not present

## 2015-08-07 DIAGNOSIS — M5441 Lumbago with sciatica, right side: Secondary | ICD-10-CM | POA: Diagnosis not present

## 2015-08-07 DIAGNOSIS — R531 Weakness: Secondary | ICD-10-CM | POA: Diagnosis not present

## 2015-08-07 DIAGNOSIS — R262 Difficulty in walking, not elsewhere classified: Secondary | ICD-10-CM | POA: Diagnosis not present

## 2015-08-07 NOTE — Therapy (Signed)
[facility] [address] [place], [place], [address] Phone: [phone]   Fax:  [phone]  Physical Therapy Treatment  Patient Details  Name: William Skinner MRN: [id number] Date of Birth: 05-18-1926 Referring Provider: Harrington Challenger, C. Antony Haste, MD  Encounter Date: 08/07/2015      PT End of Session - 08/07/15 1029    Visit Number 3   Number of Visits 10   Date for PT Re-Evaluation 09/23/15   Authorization Type Medicare G codes   PT Start Time [id number]   PT Stop Time 1055   PT Time Calculation (min) 40 min   Activity Tolerance Patient tolerated treatment well      Past Medical History  Diagnosis Date  . Back pain   . Right leg pain   . Hypertension   . Hyperlipidemia   . Carotid artery occlusion     Past Surgical History  Procedure Laterality Date  . Hernia repair    . Nose surgery    . Cataract extraction Bilateral   . Carotid endarterectomy Left   . Knee surgery Left     There were no vitals filed for this visit.  Visit Diagnosis:  Weakness generalized  Posture abnormality  Bilateral low back pain with right-sided sciatica  Difficulty walking      Subjective Assessment - 08/07/15 1010    Subjective Patient arrives 45 min early for appt.  Denies pain at present.   Currently in Pain? No/denies   Pain Score 0-No pain   Pain Location Back   Pain Orientation Right                         [facility] Adult PT Treatment/Exercise - 08/07/15 1017    Lumbar Exercises: Seated   Sit to Stand Limitations Seated 2# plyo bal shoulder to shoulder 1 min   Knee/Hip Exercises: Aerobic   Nustep Seat 12, arms 9 Level 1 8 min   Knee/Hip Exercises: Standing   Heel Raises Both;10 reps   Hip Abduction Stengthening;Right;Left;1 set;15 reps   Hip Extension Stengthening;Right;Left;1 set;15 reps   Other Standing Knee Exercises Weight shifting + formation with B UE support   Other Standing Knee Exercises  Sidestepping R/L 1 minute   Knee/Hip Exercises: Seated   Long Arc Quad Strengthening;Right;Left;2 sets;10 reps   Long Arc Quad Weight --  red band   Clamshell with TheraBand Red  10x   Knee/Hip Flexion red band hip flex/ankle DF R/L 2x10   Sit to Sand 1 set;5 reps;with UE support  from Nu-Step seat    Shoulder Exercises: Seated   Extension Strengthening;Both;15 reps;Theraband   Theraband Level (Shoulder Extension) Level 1 (Yellow)   Row Strengthening;Both;15 reps;Theraband   Theraband Level (Shoulder Row) Level 1 (Yellow)                  PT Short Term Goals - 08/07/15 1049    PT SHORT TERM GOAL #1   Title be independent in iniital HEP   Time 4   Period Weeks   Status On-going   PT SHORT TERM GOAL #2   Title walk for 3-4 minutes in hallway without need to rest due to fatigue or pain   Time 4   Period Weeks   Status On-going   PT SHORT TERM GOAL #3   Title improve LE strength to perform sit to stand without instability upon standing   Time 4   Period Weeks  Status On-going   PT SHORT TERM GOAL #4   Title report a 25% reduction in Rt LE pain with ADLs and self-care   Time 4   Period Weeks   Status On-going   PT SHORT TERM GOAL #5   Title perform TUG in < or = to 30 seconds to improve balance and safety   Time 4   Period Weeks   Status On-going           PT Long Term Goals - 08/07/15 1049    PT LONG TERM GOAL #1   Title be independent in advanced HEP   Time 8   Period Weeks   Status On-going   PT LONG TERM GOAL #2   Title reduce FOTO to < or = to 49% limitation   Time 8   Status On-going   PT LONG TERM GOAL #3   Title perform TUG in < or = to 25 seconds to improve stability/balance   Time 8   Period Weeks   Status On-going   PT LONG TERM GOAL #4   Title report 50% reduction in Rt LE pain with ADLs and self-care   Time 8   Period Weeks   Status On-going   PT LONG TERM GOAL #5   Title walk for 5-7 minutes without need for rest due to  fatigue or pain   Time 8   Period Weeks   Status On-going               Plan - 08/07/15 1045    Clinical Impression Statement The patient denies pain at rest but reports some discomfort in right LE in standing.  Audible crepitus noted right LE.  He fatigues with standing requiring 2 seated rest breaks.  Short shuffled steps with ambulation.    Needs heavy UE use to rise from a standard chair  but min/mod use of UEs from higher chair.  Therapist closely monitoring for pain and fatigue level as well as close supervision in standing for safety.     PT Next Visit Plan Resume right HS stretching next visit;  LE strength progression, balance training        Problem List Patient Active Problem List   Diagnosis Date Noted  . Pain in joint, lower leg 03/06/2014    Ruben Im C 08/07/2015, 10:54 AM  [facility] [address], [place], [place], [address] Phone: [phone]   Fax:  [phone]  Name: Jaze Colantonio MRN: [id number] Date of Birth: Aug 11, 1925    Ruben Im, PT 08/07/2015 10:54 AM Phone: [phone] Fax: [phone]

## 2015-08-12 ENCOUNTER — Ambulatory Visit: Payer: Medicare Other | Admitting: Physical Therapy

## 2015-08-12 ENCOUNTER — Encounter: Payer: Self-pay | Admitting: Physical Therapy

## 2015-08-12 DIAGNOSIS — R262 Difficulty in walking, not elsewhere classified: Secondary | ICD-10-CM | POA: Diagnosis not present

## 2015-08-12 DIAGNOSIS — M5441 Lumbago with sciatica, right side: Secondary | ICD-10-CM

## 2015-08-12 DIAGNOSIS — R531 Weakness: Secondary | ICD-10-CM

## 2015-08-12 DIAGNOSIS — R293 Abnormal posture: Secondary | ICD-10-CM

## 2015-08-12 NOTE — Therapy (Signed)
[facility] [address], [place], [place], [address] Phone: [phone]   Fax:  [phone]  Physical Therapy Treatment  Patient Details  Name: William Skinner MRN: [id number] Date of Birth: [birth year] Referring Provider: Harrington Challenger, C. Antony Haste, MD  Encounter Date: 08/12/2015      PT End of Session - 08/12/15 1013    Visit Number 4   Number of Visits 10   Date for PT Re-Evaluation 09/23/15   Authorization Type Medicare G codes   PT Start Time 1005   PT Stop Time 1050   PT Time Calculation (min) 45 min   Activity Tolerance Patient tolerated treatment well   Behavior During Therapy [facility] for tasks assessed/performed      Past Medical History  Diagnosis Date  . Back pain   . Right leg pain   . Hypertension   . Hyperlipidemia   . Carotid artery occlusion     Past Surgical History  Procedure Laterality Date  . Hernia repair    . Nose surgery    . Cataract extraction Bilateral   . Carotid endarterectomy Left   . Knee surgery Left     There were no vitals filed for this visit.  Visit Diagnosis:  Weakness generalized  Posture abnormality  Bilateral low back pain with right-sided sciatica  Difficulty walking      Subjective Assessment - 08/12/15 1011    Subjective Patient reports feeling stronger as noticed with his transfers from sitting to standing and walking seems to be better, however the right leg is giving me problems   Pertinent History Lt TKA-3 years ago   Limitations Standing;Walking   How long can you sit comfortably? no limits   How long can you stand comfortably? 10 minutes    How long can you walk comfortably? < 5 minutes   Diagnostic tests none recent   Patient Stated Goals reduce Rt leg pain, return to walking in halls at home   Currently in Pain? No/denies                         [facility] Adult PT Treatment/Exercise - 08/12/15 0001    Bed Mobility   Bed Mobility --  Pt  wishes to be called BOB   Transfers   Transfers --   Comments --   Ambulation/Gait   Ambulation/Gait Yes   Ambulation/Gait Assistance 5: Supervision   Ambulation Distance (Feet) 100 Feet   Assistive device 4-wheeled walker   Gait Pattern Step-to pattern;Decreased stride length;Decreased trunk rotation;Shuffle;Poor foot clearance - left;Poor foot clearance - right;Trunk flexed   Gait Comments pt with incr trunk flexion   Posture/Postural Control   Posture/Postural Control Postural limitations   Postural Limitations Forward head;Rounded Shoulders;Flexed trunk   Exercises   Exercises Lumbar;Knee/Hip   Lumbar Exercises: Stretches   Active Hamstring Stretch 3 reps;20 seconds  in sitting with Assist by PTA   Lumbar Exercises: Standing   Other Standing Lumbar Exercises in front of mat table tossing & catching ball 2x10, pt not leaning on but flexion in bil knees and hip  2 x10 with close supervision for safety   Other Standing Lumbar Exercises standing at counter rolling ball up 2 x10 to improve trunk extension   Knee/Hip Exercises: Aerobic   Nustep Seat#12, arms# Level 1, 8 min   Knee/Hip Exercises: Standing   Heel Raises Both;10 reps   Hip Abduction Stengthening;Right;Left;1 set;15 reps  2# added   Hip  Extension Stengthening;Right;Left;1 set;15 reps  2# added   Other Standing Knee Exercises Sidestepping R/L 1 minute   Knee/Hip Exercises: Seated   Knee/Hip Flexion red band bil knee flex 2x10  sitting elevated on blue pillow   Sit to Sand 1 set;5 reps;with UE support  from chair without armrest, vc'sfor technique                  PT Short Term Goals - 08/12/15 1020    PT SHORT TERM GOAL #1   Title be independent in iniital HEP   Time 4   Period Weeks   Status On-going   PT SHORT TERM GOAL #2   Title walk for 3-4 minutes in hallway without need to rest due to fatigue or pain   Time 4   Period Weeks   Status On-going   PT SHORT TERM GOAL #3   Title improve LE  strength to perform sit to stand without instability upon standing   Time 4   Period Weeks   Status On-going   PT SHORT TERM GOAL #4   Title report a 25% reduction in Rt LE pain with ADLs and self-care   Time 4   Period Weeks   Status On-going   PT SHORT TERM GOAL #5   Title perform TUG in < or = to 30 seconds to improve balance and safety   Time 4   Period Weeks   Status On-going           PT Long Term Goals - 08/07/15 1049    PT LONG TERM GOAL #1   Title be independent in advanced HEP   Time 8   Period Weeks   Status On-going   PT LONG TERM GOAL #2   Title reduce FOTO to < or = to 49% limitation   Time 8   Status On-going   PT LONG TERM GOAL #3   Title perform TUG in < or = to 25 seconds to improve stability/balance   Time 8   Period Weeks   Status On-going   PT LONG TERM GOAL #4   Title report 50% reduction in Rt LE pain with ADLs and self-care   Time 8   Period Weeks   Status On-going   PT LONG TERM GOAL #5   Title walk for 5-7 minutes without need for rest due to fatigue or pain   Time 8   Period Weeks   Status On-going               Plan - 08/12/15 1014    Clinical Impression Statement No complain of pain in right LE but discomfort and weakness. Pt ambulates with his Merry walker with shuffled gait. Pt will continue to benefit from skiled Pt to inmrpve overall endurance and strength.   Pt will benefit from skilled therapeutic intervention in order to improve on the following deficits Abnormal gait;Decreased range of motion;Difficulty walking;Pain;Postural dysfunction;Decreased strength;Decreased mobility;Decreased balance;Improper body mechanics;Decreased activity tolerance;Decreased safety awareness;Decreased endurance   Rehab Potential Good   PT Frequency 2x / week   PT Duration 8 weeks   PT Treatment/Interventions ADLs/Self Care Home Management;Cryotherapy;Electrical Stimulation;Moist Heat;Therapeutic exercise;Therapeutic activities;Functional  mobility training;Gait training;Ultrasound;Traction;Neuromuscular re-education;Patient/family education;Manual techniques;Passive range of motion   PT Next Visit Plan continue right HS stretching next visit;  LE strength progression, balance training   Consulted and Agree with Plan of Care Patient        Problem List Patient Active Problem List   Diagnosis Date  Noted  . Pain in joint, lower leg 03/06/2014    NAUMANN-HOUEGNIFIO,Trudi Morgenthaler PTA 08/12/2015, 11:02 AM  [facility] [address], [place], [place], [address] Phone: [phone]   Fax:  [phone]  Name: William Skinner MRN: [id number] Date of Birth: 16-Feb-1926

## 2015-08-14 ENCOUNTER — Ambulatory Visit: Payer: Medicare Other | Admitting: Physical Therapy

## 2015-08-14 DIAGNOSIS — R262 Difficulty in walking, not elsewhere classified: Secondary | ICD-10-CM | POA: Diagnosis not present

## 2015-08-14 DIAGNOSIS — R293 Abnormal posture: Secondary | ICD-10-CM

## 2015-08-14 DIAGNOSIS — M5441 Lumbago with sciatica, right side: Secondary | ICD-10-CM

## 2015-08-14 DIAGNOSIS — R531 Weakness: Secondary | ICD-10-CM
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
## 2015-08-19 ENCOUNTER — Ambulatory Visit: Payer: Medicare Other | Admitting: Physical Therapy

## 2015-08-19 ENCOUNTER — Encounter: Payer: Medicare Other | Admitting: Physical Therapy

## 2015-08-19 ENCOUNTER — Encounter: Payer: Self-pay | Admitting: Physical Therapy

## 2015-08-19 DIAGNOSIS — R262 Difficulty in walking, not elsewhere classified: Secondary | ICD-10-CM | POA: Diagnosis not present

## 2015-08-19 DIAGNOSIS — R293 Abnormal posture: Secondary | ICD-10-CM

## 2015-08-19 DIAGNOSIS — M5441 Lumbago with sciatica, right side: Secondary | ICD-10-CM

## 2015-08-19 DIAGNOSIS — R531 Weakness: Secondary | ICD-10-CM | POA: Diagnosis not present

## 2015-08-19 NOTE — Therapy (Signed)
[facility] [address], [place], [place], [address] Phone: [phone]   Fax:  [phone]  Physical Therapy Treatment  Patient Details  Name: William Skinner MRN: [id number] Date of Birth: 1925-10-30 Referring Provider: Harrington Challenger, C. Antony Haste, MD  Encounter Date: 08/19/2015    Past Medical History  Diagnosis Date  . Back pain   . Right leg pain   . Hypertension   . Hyperlipidemia   . Carotid artery occlusion     Past Surgical History  Procedure Laterality Date  . Hernia repair    . Nose surgery    . Cataract extraction Bilateral   . Carotid endarterectomy Left   . Knee surgery Left     There were no vitals filed for this visit.  Visit Diagnosis:  Weakness generalized  Posture abnormality  Bilateral low back pain with right-sided sciatica  Difficulty walking      Subjective Assessment - 08/19/15 1120    Subjective Pt feels improvement with trunkextension, he states he feels tired after PT session but no complain of pain. challenging to bear weight on right leg due to limitations right knee - pt tries to avoid TKA on Rt   Pertinent History Lt TKA-3 years ago   Limitations Standing;Walking   How long can you sit comfortably? no limits   How long can you stand comfortably? 10 minutes    How long can you walk comfortably? < 5 minutes   Diagnostic tests none recent   Patient Stated Goals reduce Rt leg pain, return to walking in halls at home   Currently in Pain? No/denies                         [facility] Adult PT Treatment/Exercise - 08/19/15 0001    Bed Mobility   Bed Mobility --  pt wishes to be called William Skinner   Ambulation/Gait   Ambulation/Gait Yes   Ambulation/Gait Assistance 5: Supervision   Ambulation Distance (Feet) 100 Feet   Assistive device 4-wheeled walker   Gait Pattern Step-to pattern;Decreased stride length;Decreased trunk rotation;Shuffle;Poor foot clearance - left;Poor foot  clearance - right;Trunk flexed   Gait Comments pt with improved trunk extension   Posture/Postural Control   Posture/Postural Control Postural limitations   Postural Limitations Forward head;Rounded Shoulders;Flexed trunk   Exercises   Exercises Lumbar;Knee/Hip   Lumbar Exercises: Standing   Other Standing Lumbar Exercises ball overhead x 10 standing with back against wall   Knee/Hip Exercises: Aerobic   Nustep Seat#12, arms# 10, Level 2, 10  min  pt tolerated incr of resistance   Knee/Hip Exercises: Standing   Heel Raises Both;10 reps   Hip Abduction Stengthening;Right;Left;1 set;15 reps  3# added   Hip Extension Stengthening;Right;Left;1 set;15 reps  3# added   Other Standing Knee Exercises step taps with 3# weight   Other Standing Knee Exercises Sidestepping R/L 1 minute  3#, with handhold assistance, difficult for pt.   Knee/Hip Exercises: Seated   Ball Squeeze 20x                  PT Short Term Goals - 08/14/15 1046    PT SHORT TERM GOAL #1   Title be independent in iniital HEP   Time 4   Period Weeks   Status Achieved   PT SHORT TERM GOAL #2   Title walk for 3-4 minutes in hallway without need to rest due to fatigue or pain   Time 4  Period Weeks   Status Partially Met   PT SHORT TERM GOAL #3   Title improve LE strength to perform sit to stand without instability upon standing   Time 4   Period Weeks   Status Partially Met   PT SHORT TERM GOAL #4   Title report a 25% reduction in Rt LE pain with ADLs and self-care   Time 4   Status On-going   PT SHORT TERM GOAL #5   Title perform TUG in < or = to 30 seconds to improve balance and safety   Time 4   Period Weeks   Status On-going           PT Long Term Goals - 08/14/15 1055    PT LONG TERM GOAL #1   Title be independent in advanced HEP   Time 8   Period Weeks   Status On-going   PT LONG TERM GOAL #2   Title reduce FOTO to < or = to 49% limitation   Time 8   Period Weeks   Status  On-going   PT LONG TERM GOAL #3   Title perform TUG in < or = to 25 seconds to improve stability/balance   Period Weeks   Status On-going   PT LONG TERM GOAL #4   Title report 50% reduction in Rt LE pain with ADLs and self-care   Time 8   Period Weeks   Status On-going   PT LONG TERM GOAL #5   Title walk for 5-7 minutes without need for rest due to fatigue or pain   Time 8   Period Weeks   Status On-going               Problem List Patient Active Problem List   Diagnosis Date Noted  . Pain in joint, lower leg 03/06/2014    NAUMANN-HOUEGNIFIO,Jannett Schmall PTA 08/19/2015, 4:12 PM  [facility] [address] [place], [place], [address] Phone: [phone]   Fax:  [phone]  Name: William Skinner MRN: [id number] Date of Birth: Apr 07, 1926

## 2015-08-21 ENCOUNTER — Ambulatory Visit: Payer: Medicare Other | Attending: Family Medicine | Admitting: Physical Therapy

## 2015-08-21 ENCOUNTER — Encounter: Payer: Medicare Other | Admitting: Physical Therapy

## 2015-08-21 DIAGNOSIS — R293 Abnormal posture: Secondary | ICD-10-CM

## 2015-08-21 DIAGNOSIS — M5441 Lumbago with sciatica, right side: Secondary | ICD-10-CM

## 2015-08-21 DIAGNOSIS — R531 Weakness: Secondary | ICD-10-CM | POA: Diagnosis not present

## 2015-08-21 DIAGNOSIS — R262 Difficulty in walking, not elsewhere classified: Secondary | ICD-10-CM

## 2015-08-21 NOTE — Therapy (Signed)
[facility] [address], [place], [place], [address] Phone: [phone]   Fax:  [phone]  Physical Therapy Treatment  Patient Details  Name: William Skinner MRN: [id number] Date of Birth: Aug 06, 1925 Referring Provider: Harrington Challenger, C. Antony Haste, MD  Encounter Date: 08/21/2015      PT End of Session - 08/21/15 1041    Visit Number 6   Number of Visits 10   Date for PT Re-Evaluation 09/23/15   Authorization Type Medicare G codes   PT Start Time 1937   PT Stop Time 1100   PT Time Calculation (min) 45 min   Activity Tolerance Patient limited by fatigue      Past Medical History  Diagnosis Date  . Back pain   . Right leg pain   . Hypertension   . Hyperlipidemia   . Carotid artery occlusion     Past Surgical History  Procedure Laterality Date  . Hernia repair    . Nose surgery    . Cataract extraction Bilateral   . Carotid endarterectomy Left   . Knee surgery Left     There were no vitals filed for this visit.  Visit Diagnosis:  Weakness generalized  Posture abnormality  Bilateral low back pain with right-sided sciatica  Difficulty walking      Subjective Assessment - 08/21/15 1021    Subjective Patient states he did not sleep well last night so he is not feeling that well today.   No pain.  Upon arrival noted pushing walking too far out ahead.     Currently in Pain? No/denies   Pain Score 0-No pain   Pain Frequency Intermittent            [facility] PT Assessment - 08/21/15 0001    Timed Up and Go Test   Normal TUG (seconds) 34.7  chair with arm rests                     [facility] Adult PT Treatment/Exercise - 08/21/15 0001    Knee/Hip Exercises: Aerobic   Nustep Seat#12, arms# 10, Level 2, 10  min  pt tolerated incr of resistance   Knee/Hip Exercises: Standing   Heel Raises Both;10 reps   Forward Step Up --  step taps 10x   Other Standing Knee Exercises Weight shifting + formation  with B UE support   Other Standing Knee Exercises Sidestepping R/L 1 minute  3#, with handhold assistance, difficult for pt.   Knee/Hip Exercises: Seated   Clamshell with TheraBand Red  red band 3x10   Knee/Hip Flexion red band bil knee flex 2x10  sitting elevated on blue pillow                  PT Short Term Goals - 08/21/15 1444    PT SHORT TERM GOAL #1   Title be independent in iniital HEP   Status Achieved   PT SHORT TERM GOAL #2   Title walk for 3-4 minutes in hallway without need to rest due to fatigue or pain   Time 4   Period Weeks   Status Partially Met   PT SHORT TERM GOAL #3   Title improve LE strength to perform sit to stand without instability upon standing   Time 4   Period Weeks   Status Partially Met   PT SHORT TERM GOAL #4   Title report a 25% reduction in Rt LE pain with ADLs and self-care   Time  4   Period Weeks   Status On-going   PT SHORT TERM GOAL #5   Title perform TUG in < or = to 30 seconds to improve balance and safety   Time 4   Period Weeks   Status On-going           PT Long Term Goals - 08/21/15 1445    PT LONG TERM GOAL #1   Title be independent in advanced HEP   Time 8   Period Weeks   Status On-going   PT LONG TERM GOAL #2   Title reduce FOTO to < or = to 49% limitation   Time 8   Period Weeks   Status On-going   PT LONG TERM GOAL #3   Title perform TUG in < or = to 25 seconds to improve stability/balance   Time 8   Period Weeks   Status On-going   PT LONG TERM GOAL #4   Title report 50% reduction in Rt LE pain with ADLs and self-care   Time 8   Period Weeks   Status On-going   PT LONG TERM GOAL #5   Title walk for 5-7 minutes without need for rest due to fatigue or pain   Time 8   Period Weeks   Status On-going               Plan - 08/21/15 1041    Clinical Impression Statement The patient has greater difficulty following instructions today as well as more stooped posture/shuffled steps.  Min  change in Timed up and go test.    Patient continues to need sitting exercises between standing after 3 min secondary to fatigue.  Audible knee crepitus.  Therapist closely monitoring pain  and safety throughout.  No further progress toward goals.  Timed up and go limited by difficulty with sit to stand.     PT Next Visit Plan continue right HS stretching;    LE strength progression, balance training; gait endurance check to see if can do 3-4 min with RW with cues to not advance walker too far out ahead        Problem List Patient Active Problem List   Diagnosis Date Noted  . Pain in joint, lower leg 03/06/2014    William Skinner 08/21/2015, 2:59 PM  [facility] [address], [place], [place], [address] Phone: [phone]   Fax:  [phone]  Name: William Skinner MRN: [id number] Date of Birth: 1925/10/06    Ruben Im, PT 08/21/2015 2:59 PM Phone: [phone] Fax: [phone]

## 2015-08-26 ENCOUNTER — Encounter: Payer: Self-pay | Admitting: Physical Therapy

## 2015-08-26 ENCOUNTER — Ambulatory Visit: Payer: Medicare Other | Admitting: Physical Therapy

## 2015-08-26 DIAGNOSIS — R262 Difficulty in walking, not elsewhere classified: Secondary | ICD-10-CM | POA: Diagnosis not present

## 2015-08-26 DIAGNOSIS — R293 Abnormal posture: Secondary | ICD-10-CM

## 2015-08-26 DIAGNOSIS — R531 Weakness: Secondary | ICD-10-CM | POA: Diagnosis not present

## 2015-08-26 DIAGNOSIS — M5441 Lumbago with sciatica, right side: Secondary | ICD-10-CM | POA: Diagnosis not present

## 2015-08-26 NOTE — Therapy (Signed)
[facility] [address] [place], [place], [address] Phone: [phone]   Fax:  [phone]  Physical Therapy Treatment  Patient Details  Name: William Skinner MRN: [id number] Date of Birth: 02/12/1926 Referring Provider: Harrington Challenger, C. Antony Haste, MD  Encounter Date: 08/26/2015      PT End of Session - 08/26/15 1058    Visit Number 7   Number of Visits 10   Date for PT Re-Evaluation 09/23/15   Authorization Type Medicare G codes   PT Start Time 1010   PT Stop Time 1057   PT Time Calculation (min) 47 min   Activity Tolerance Patient limited by fatigue   Behavior During Therapy [name] for tasks assessed/performed      Past Medical History  Diagnosis Date  . Back pain   . Right leg pain   . Hypertension   . Hyperlipidemia   . Carotid artery occlusion     Past Surgical History  Procedure Laterality Date  . Hernia repair    . Nose surgery    . Cataract extraction Bilateral   . Carotid endarterectomy Left   . Knee surgery Left     There were no vitals filed for this visit.  Visit Diagnosis:  Weakness generalized  Posture abnormality  Bilateral low back pain with right-sided sciatica  Difficulty walking      Subjective Assessment - 08/26/15 1035    Subjective Pt reports continues to notice improvement with his walking and more aware of staying inside of his rolling walker. Right knee is painful with weightbearing   Pertinent History Lt TKA-3 years ago   Limitations Standing;Walking   How long can you sit comfortably? no limits   How long can you stand comfortably? 10 minutes    How long can you walk comfortably? < 5 minutes   Diagnostic tests none recent   Patient Stated Goals reduce Rt leg pain, return to walking in halls at home   Currently in Pain? No/denies                         [facility] Adult PT Treatment/Exercise - 08/26/15 0001    Bed Mobility   Bed Mobility --  pt wishes to be  called William Skinner   Ambulation/Gait   Ambulation/Gait Yes   Ambulation/Gait Assistance 5: Supervision   Ambulation Distance (Feet) 100 Feet   Assistive device 4-wheeled walker   Gait Pattern Step-to pattern;Decreased stride length;Decreased trunk rotation;Shuffle;Poor foot clearance - left;Poor foot clearance - right;Trunk flexed   Gait Comments pt with improved trunk extension   Posture/Postural Control   Posture/Postural Control Postural limitations   Postural Limitations Forward head;Rounded Shoulders;Flexed trunk   Exercises   Exercises Lumbar;Knee/Hip   Lumbar Exercises: Standing   Other Standing Lumbar Exercises rolling ball up the wall x5 with red physioball, x 10 with blue ball challenging   Other Standing Lumbar Exercises --   Knee/Hip Exercises: Aerobic   Nustep Seat#12, arms# 10, Level 2, 10  min  0.41mh   Knee/Hip Exercises: Standing   Heel Raises Both;20 reps   Hip Abduction Stengthening;Right;Left;1 set;15 reps   Forward Step Up 20 reps;Hand Hold: 2;Step Height: 6"  forward taps    Other Standing Knee Exercises Sidestepping R/L 1 minute  3# added, handhold assist    Knee/Hip Exercises: Seated   Clamshell with TheraBand Red  3 x10   Knee/Hip Flexion red band bil knee flex 3x10  sitting elevated on blue  pillow                  PT Short Term Goals - 08/26/15 1100    PT SHORT TERM GOAL #1   Title be independent in iniital HEP   Time 4   Period Weeks   Status Achieved   PT SHORT TERM GOAL #2   Title walk for 3-4 minutes in hallway without need to rest due to fatigue or pain   Time 4   Period Weeks   Status Partially Met   PT SHORT TERM GOAL #3   Title improve LE strength to perform sit to stand without instability upon standing   Time 4   Period Weeks   Status Partially Met   PT SHORT TERM GOAL #4   Title report a 25% reduction in Rt LE pain with ADLs and self-care   Time 4   Period Weeks   Status On-going   PT SHORT TERM GOAL #5   Title perform  TUG in < or = to 30 seconds to improve balance and safety   Time 4   Period Weeks   Status On-going           PT Long Term Goals - 08/21/15 1445    PT LONG TERM GOAL #1   Title be independent in advanced HEP   Time 8   Period Weeks   Status On-going   PT LONG TERM GOAL #2   Title reduce FOTO to < or = to 49% limitation   Time 8   Period Weeks   Status On-going   PT LONG TERM GOAL #3   Title perform TUG in < or = to 25 seconds to improve stability/balance   Time 8   Period Weeks   Status On-going   PT LONG TERM GOAL #4   Title report 50% reduction in Rt LE pain with ADLs and self-care   Time 8   Period Weeks   Status On-going   PT LONG TERM GOAL #5   Title walk for 5-7 minutes without need for rest due to fatigue or pain   Time 8   Period Weeks   Status On-going               Plan - 08/26/15 1058    Clinical Impression Statement Pt has a good day today, able to perform standing exercises with increased trunk control. Still challenging with weightbearing on right knee   Pt will benefit from skilled therapeutic intervention in order to improve on the following deficits Abnormal gait;Decreased range of motion;Difficulty walking;Pain;Postural dysfunction;Decreased strength;Decreased mobility;Decreased balance;Improper body mechanics;Decreased activity tolerance;Decreased safety awareness;Decreased endurance   Rehab Potential Good   PT Frequency 2x / week   PT Duration 8 weeks   PT Treatment/Interventions ADLs/Self Care Home Management;Cryotherapy;Electrical Stimulation;Moist Heat;Therapeutic exercise;Therapeutic activities;Functional mobility training;Gait training;Ultrasound;Traction;Neuromuscular re-education;Patient/family education;Manual techniques;Passive range of motion   PT Next Visit Plan continue right HS stretching;    LE strength progression, balance training; gait endurance check to see if can do 3-4 min with RW with cues to not advance walker too far out  ahead   Consulted and Agree with Plan of Care Patient        Problem List Patient Active Problem List   Diagnosis Date Noted  . Pain in joint, lower leg 03/06/2014    NAUMANN-HOUEGNIFIO,Tatem Fesler PTA 08/26/2015, 11:04 AM  [facility] [address], [place], [place], [address] Phone: [phone]   Fax:  [phone]  Name: William Skinner MRN: [id number] Date of Birth: Jan 02, 1926

## 2015-08-28 ENCOUNTER — Ambulatory Visit: Payer: Medicare Other | Admitting: Physical Therapy

## 2015-08-28 ENCOUNTER — Encounter: Payer: Self-pay | Admitting: Physical Therapy

## 2015-08-28 DIAGNOSIS — R293 Abnormal posture: Secondary | ICD-10-CM | POA: Diagnosis not present

## 2015-08-28 DIAGNOSIS — R531 Weakness: Secondary | ICD-10-CM | POA: Diagnosis not present

## 2015-08-28 DIAGNOSIS — M5441 Lumbago with sciatica, right side: Secondary | ICD-10-CM | POA: Diagnosis not present

## 2015-08-28 DIAGNOSIS — R262 Difficulty in walking, not elsewhere classified: Secondary | ICD-10-CM | POA: Diagnosis not present

## 2015-08-28 NOTE — Therapy (Signed)
[facility] [address] [place], [place], [address] Phone: [phone]   Fax:  [phone]  Physical Therapy Treatment  Patient Details  Name: William Skinner MRN: [id number] Date of Birth: 1925-10-03 Referring Provider: Harrington Challenger, C. Antony Haste, MD  Encounter Date: 08/28/2015      PT End of Session - 08/28/15 1031    Visit Number 8   Number of Visits 10   Date for PT Re-Evaluation 09/23/15   Authorization Type Medicare G codes   PT Start Time 1010   PT Stop Time 1056   PT Time Calculation (min) 46 min   Activity Tolerance Patient limited by fatigue   Behavior During Therapy [facility] for tasks assessed/performed      Past Medical History  Diagnosis Date  . Back pain   . Right leg pain   . Hypertension   . Hyperlipidemia   . Carotid artery occlusion     Past Surgical History  Procedure Laterality Date  . Hernia repair    . Nose surgery    . Cataract extraction Bilateral   . Carotid endarterectomy Left   . Knee surgery Left     There were no vitals filed for this visit.  Visit Diagnosis:  Weakness generalized  Posture abnormality  Bilateral low back pain with right-sided sciatica  Difficulty walking      Subjective Assessment - 08/28/15 1029    Subjective Pt continues to notice improvement with walking and strength in rigth LE. Pain in right knee with weightbearing   Pertinent History Lt TKA-3 years ago   Limitations Standing;Walking   How long can you sit comfortably? no limits   How long can you stand comfortably? 10 minutes    How long can you walk comfortably? < 5 minutes   Diagnostic tests none recent   Patient Stated Goals reduce Rt leg pain, return to walking in halls at home   Currently in Pain? No/denies                         [facility] Adult PT Treatment/Exercise - 08/28/15 0001    Bed Mobility   Bed Mobility --  pt wishes to be called BOB   Ambulation/Gait   Ambulation/Gait Yes   Ambulation/Gait Assistance 5: Supervision   Ambulation Distance (Feet) 100 Feet   Assistive device 4-wheeled walker   Gait Pattern Step-to pattern;Decreased stride length;Decreased trunk rotation;Shuffle;Poor foot clearance - left;Poor foot clearance - right;Trunk flexed  Pt needs reminders to incr hip flex   Gait Comments pt with improved trunk extension   Posture/Postural Control   Posture/Postural Control Postural limitations   Postural Limitations Forward head;Rounded Shoulders;Flexed trunk   Exercises   Exercises Lumbar;Knee/Hip   Lumbar Exercises: Standing   Other Standing Lumbar Exercises rolling ball blue ball up the wall 2 x10   Knee/Hip Exercises: Aerobic   Nustep Seat#12, arms# 10, Level 2, 10  min  0.101mh   Knee/Hip Exercises: Standing   Heel Raises Both;20 reps   Hip Abduction Stengthening;Right;Left;2 sets;10 reps  3# added   Hip Extension Stengthening;Right;Left;2 sets;10 reps  3#   Forward Step Up 20 reps;Hand Hold: 2;Step Height: 6"  3# added, alternating   Knee/Hip Exercises: Seated   Clamshell with TheraBand Red  2 x 10   Knee/Hip Flexion red band bil knee flex 3x10, elevated sitting on blue pillow  PT Short Term Goals - 08/26/15 1100    PT SHORT TERM GOAL #1   Title be independent in iniital HEP   Time 4   Period Weeks   Status Achieved   PT SHORT TERM GOAL #2   Title walk for 3-4 minutes in hallway without need to rest due to fatigue or pain   Time 4   Period Weeks   Status Partially Met   PT SHORT TERM GOAL #3   Title improve LE strength to perform sit to stand without instability upon standing   Time 4   Period Weeks   Status Partially Met   PT SHORT TERM GOAL #4   Title report a 25% reduction in Rt LE pain with ADLs and self-care   Time 4   Period Weeks   Status On-going   PT SHORT TERM GOAL #5   Title perform TUG in < or = to 30 seconds to improve balance and safety   Time 4   Period  Weeks   Status On-going           PT Long Term Goals - 08/21/15 1445    PT LONG TERM GOAL #1   Title be independent in advanced HEP   Time 8   Period Weeks   Status On-going   PT LONG TERM GOAL #2   Title reduce FOTO to < or = to 49% limitation   Time 8   Period Weeks   Status On-going   PT LONG TERM GOAL #3   Title perform TUG in < or = to 25 seconds to improve stability/balance   Time 8   Period Weeks   Status On-going   PT LONG TERM GOAL #4   Title report 50% reduction in Rt LE pain with ADLs and self-care   Time 8   Period Weeks   Status On-going   PT LONG TERM GOAL #5   Title walk for 5-7 minutes without need for rest due to fatigue or pain   Time 8   Period Weeks   Status On-going               Plan - 08/28/15 1032    Clinical Impression Statement Pt with good determination and performance of exercises. Pt will continue to benefit from skilled PT for strength and endurance   Pt will benefit from skilled therapeutic intervention in order to improve on the following deficits Abnormal gait;Decreased range of motion;Difficulty walking;Pain;Postural dysfunction;Decreased strength;Decreased mobility;Decreased balance;Improper body mechanics;Decreased activity tolerance;Decreased safety awareness;Decreased endurance   Rehab Potential Good   PT Frequency 2x / week   PT Duration 8 weeks   PT Treatment/Interventions ADLs/Self Care Home Management;Cryotherapy;Electrical Stimulation;Moist Heat;Therapeutic exercise;Therapeutic activities;Functional mobility training;Gait training;Ultrasound;Traction;Neuromuscular re-education;Patient/family education;Manual techniques;Passive range of motion   PT Next Visit Plan Right HS stretching;    LE strength progression, balance training; gait endurance check to see if can do 3-4 min with RW with cues to not advance walker too far out ahead   Consulted and Agree with Plan of Care Patient        Problem List Patient Active  Problem List   Diagnosis Date Noted  . Pain in joint, lower leg 03/06/2014    NAUMANN-HOUEGNIFIO,Sharra Cayabyab PTA 08/28/2015, 12:24 PM  [facility] [address] [place], [place], [address] Phone: [phone]   Fax:  [phone]  Name: Embry Huss MRN: [id number] Date of Birth: [birth year]

## 2015-09-02 ENCOUNTER — Encounter: Payer: Self-pay | Admitting: Physical Therapy

## 2015-09-02 ENCOUNTER — Ambulatory Visit: Payer: Medicare Other | Admitting: Physical Therapy

## 2015-09-02 DIAGNOSIS — R293 Abnormal posture: Secondary | ICD-10-CM | POA: Diagnosis not present

## 2015-09-02 DIAGNOSIS — R262 Difficulty in walking, not elsewhere classified: Secondary | ICD-10-CM | POA: Diagnosis not present

## 2015-09-02 DIAGNOSIS — M5441 Lumbago with sciatica, right side: Secondary | ICD-10-CM | POA: Diagnosis not present

## 2015-09-02 DIAGNOSIS — R531 Weakness: Secondary | ICD-10-CM

## 2015-09-02 NOTE — Therapy (Signed)
[facility] [address] [place], [place], [address] Phone: [phone]   Fax:  [phone]  Physical Therapy Treatment  Patient Details  Name: William Skinner MRN: [id number] Date of Birth: 09/10/1925 Referring Provider: Harrington Challenger, C. Antony Haste, MD  Encounter Date: 09/02/2015      PT End of Session - 09/02/15 1100    Visit Number 9   Number of Visits 10   Date for PT Re-Evaluation 09/23/15   Authorization Type Medicare G codes   PT Start Time 0954   PT Stop Time 1042   PT Time Calculation (min) 48 min   Activity Tolerance Patient limited by fatigue   Behavior During Therapy [facility] for tasks assessed/performed      Past Medical History  Diagnosis Date  . Back pain   . Right leg pain   . Hypertension   . Hyperlipidemia   . Carotid artery occlusion     Past Surgical History  Procedure Laterality Date  . Hernia repair    . Nose surgery    . Cataract extraction Bilateral   . Carotid endarterectomy Left   . Knee surgery Left     There were no vitals filed for this visit.  Visit Diagnosis:  Weakness generalized  Posture abnormality  Bilateral low back pain with right-sided sciatica  Difficulty walking      Subjective Assessment - 09/02/15 1033    Subjective Pt limited by pain with weigthbearing on right leg. Pt wishes to avoid TKA  surgery on right.    Pertinent History Lt TKA-3 years ago   Limitations Standing;Walking   How long can you sit comfortably? no limits   How long can you stand comfortably? 10 minutes    How long can you walk comfortably? < 5 minutes   Diagnostic tests none recent   Patient Stated Goals reduce Rt leg pain, return to walking in halls at home   Currently in Pain? No/denies            [facility] PT Assessment - 09/02/15 0001    Assessment   Medical Diagnosis Low back pain (M54.5)   Onset Date/Surgical Date 07/28/13   Next MD Visit 6 months   Prior Therapy ~1.5 years ago    Precautions   Precautions Fall   Restrictions   Weight Bearing Restrictions No   Nixa - 4 wheels   Prior Function   Level of Independence Independent   Vocation Retired   Leisure likes to walk, hasnt been able to due to pain in Rt LE   Cognition   Overall Cognitive Status Within Functional Limits for tasks assessed   Memory Impaired   Observation/Other Assessments   Focus on Therapeutic Outcomes (FOTO)  60% limitation at eval 60%   Timed Up and Go Test   Normal TUG (seconds) 30  32 x2                     [facility] Adult PT Treatment/Exercise - 09/02/15 0001    Ambulation/Gait   Ambulation/Gait Yes   Ambulation/Gait Assistance 5: Supervision   Ambulation Distance (Feet) 80 Feet   Assistive device 4-wheeled walker   Gait Pattern Step-to pattern;Decreased stride length;Decreased trunk rotation;Shuffle;Poor foot clearance - left;Poor foot clearance - right;Trunk flexed   Gait Comments pt with improved trunk extension   Posture/Postural Control   Posture/Postural Control Postural limitations   Postural Limitations Forward head;Rounded  Shoulders;Flexed trunk   Exercises   Exercises Lumbar;Knee/Hip   Knee/Hip Exercises: Aerobic   Nustep Seat#12, arms# 10, Level 2, 10  min   Knee/Hip Exercises: Standing   Heel Raises Both;20 reps   Hip Abduction Stengthening;Right;Left;2 sets;10 reps  3# added   Hip Extension Stengthening;Right;Left;2 sets;10 reps  3#   Forward Step Up 20 reps;Hand Hold: 2;Step Height: 6"  3# added alternating   Knee/Hip Exercises: Seated   Clamshell with TheraBand Red  2 x10   Knee/Hip Flexion red band bil knee flex 3x10, elevated sitting on blue pillow                  PT Short Term Goals - 09/02/15 1036    PT SHORT TERM GOAL #1   Title be independent in iniital HEP   Time 4   Period Weeks   Status Achieved   PT SHORT TERM GOAL #2   Title walk for 3-4  minutes in hallway without need to rest due to fatigue or pain   Time 4   Period Weeks   Status Achieved   PT SHORT TERM GOAL #3   Title improve LE strength to perform sit to stand without instability upon standing   Time 4   Period Weeks   Status Partially Met   PT SHORT TERM GOAL #4   Title report a 25% reduction in Rt LE pain with ADLs and self-care   Time 4   Period Weeks   Status Not Met   PT SHORT TERM GOAL #5   Title perform TUG in < or = to 30 seconds to improve balance and safety  Feb 14, 32 sec x 2, 30 sec x 1   Time 4   Period Weeks   Status Achieved           PT Long Term Goals - 09/02/15 1038    PT LONG TERM GOAL #1   Title be independent in advanced HEP   Time 8   Period Weeks   Status Achieved   PT LONG TERM GOAL #2   Title reduce FOTO to < or = to 49% limitation  60% limitation as of Feb 14, at eval 65% limitation   Time 8   Period Weeks   Status Not Met   PT LONG TERM GOAL #3   Title perform TUG in < or = to 25 seconds to improve stability/balance   Time 8   Period Weeks   Status Not Met   PT LONG TERM GOAL #4   Title report 50% reduction in Rt LE pain with ADLs and self-care   Time 8   Period Weeks   Status Not Met   PT LONG TERM GOAL #5   Title walk for 5-7 minutes without need for rest due to fatigue or pain   Time 8   Period Weeks   Status Not Met               Plan - 09/02/15 1101    Clinical Impression Statement Pt appears tired and limited by decreased weigthbearing tolerance on rigth LE due to Rt knee pain.    Pt will benefit from skilled therapeutic intervention in order to improve on the following deficits Abnormal gait;Decreased range of motion;Difficulty walking;Pain;Postural dysfunction;Decreased strength;Decreased mobility;Decreased balance;Improper body mechanics;Decreased activity tolerance;Decreased safety awareness;Decreased endurance   PT Duration 8 weeks   PT Treatment/Interventions ADLs/Self Care Home  Management;Cryotherapy;Electrical Stimulation;Moist Heat;Therapeutic exercise;Therapeutic activities;Functional mobility training;Gait training;Ultrasound;Traction;Neuromuscular re-education;Patient/family education;Manual techniques;Passive  range of motion   PT Next Visit Plan D/C   Consulted and Agree with Plan of Care Patient     G code:  Other PT  Goal CK Discharge CK  PHYSICAL THERAPY DISCHARGE SUMMARY  Visits from Start of Care: 9  Current functional level related to goals / functional outcomes: The patient requests discharge from PT today.   He has met partial goals as above.  He continues to have right knee pain and crepitus which limits his standing/walking  tolerance.     Remaining deficits: See goals above   Education / Equipment: HEP Plan: Patient agrees to discharge.  Patient goals were partially met. Patient is being discharged due to the patient's request.  ?????       Problem List Patient Active Problem List   Diagnosis Date Noted  . Pain in joint, lower leg 03/06/2014   Angelise Petrich Naumann-Houegnifio, PTA 09/02/2015 11:08 AM  Ruben Im, PT 09/02/2015 5:06 PM Phone: [phone] Fax: [facility] [address], [place], [place], [address] Phone: [phone]   Fax:  [phone]  Name: William Skinner MRN: [id number] Date of Birth: [birth year]

## 2015-09-04 ENCOUNTER — Ambulatory Visit: Payer: Medicare Other | Admitting: Physical Therapy

## 2015-12-16 DIAGNOSIS — I10 Essential (primary) hypertension: Secondary | ICD-10-CM | POA: Diagnosis not present

## 2015-12-16 DIAGNOSIS — R0600 Dyspnea, unspecified: Secondary | ICD-10-CM | POA: Diagnosis not present

## 2015-12-16 DIAGNOSIS — M25569 Pain in unspecified knee: Secondary | ICD-10-CM | POA: Diagnosis not present

## 2015-12-16 DIAGNOSIS — M5417 Radiculopathy, lumbosacral region: Secondary | ICD-10-CM | POA: Diagnosis not present

## 2015-12-16 DIAGNOSIS — R2681 Unsteadiness on feet: Secondary | ICD-10-CM | POA: Diagnosis not present

## 2015-12-19 DIAGNOSIS — Z9181 History of falling: Secondary | ICD-10-CM | POA: Diagnosis not present

## 2015-12-19 DIAGNOSIS — Z96652 Presence of left artificial knee joint: Secondary | ICD-10-CM | POA: Diagnosis not present

## 2015-12-19 DIAGNOSIS — R2681 Unsteadiness on feet: Secondary | ICD-10-CM | POA: Diagnosis not present

## 2015-12-19 DIAGNOSIS — M5417 Radiculopathy, lumbosacral region: Secondary | ICD-10-CM | POA: Diagnosis not present

## 2015-12-19 DIAGNOSIS — I10 Essential (primary) hypertension: Secondary | ICD-10-CM | POA: Diagnosis not present

## 2015-12-19 DIAGNOSIS — Z87891 Personal history of nicotine dependence: Secondary | ICD-10-CM | POA: Diagnosis not present

## 2015-12-19 DIAGNOSIS — M25562 Pain in left knee: Secondary | ICD-10-CM | POA: Diagnosis not present

## 2015-12-22 DIAGNOSIS — M5417 Radiculopathy, lumbosacral region: Secondary | ICD-10-CM | POA: Diagnosis not present

## 2015-12-22 DIAGNOSIS — Z96652 Presence of left artificial knee joint: Secondary | ICD-10-CM | POA: Diagnosis not present

## 2015-12-22 DIAGNOSIS — M25562 Pain in left knee: Secondary | ICD-10-CM | POA: Diagnosis not present

## 2015-12-22 DIAGNOSIS — Z9181 History of falling: Secondary | ICD-10-CM | POA: Diagnosis not present

## 2015-12-22 DIAGNOSIS — R2681 Unsteadiness on feet: Secondary | ICD-10-CM | POA: Diagnosis not present

## 2015-12-22 DIAGNOSIS — I10 Essential (primary) hypertension: Secondary | ICD-10-CM | POA: Diagnosis not present

## 2015-12-24 DIAGNOSIS — R2681 Unsteadiness on feet: Secondary | ICD-10-CM | POA: Diagnosis not present

## 2015-12-24 DIAGNOSIS — M25562 Pain in left knee: Secondary | ICD-10-CM | POA: Diagnosis not present

## 2015-12-24 DIAGNOSIS — M5417 Radiculopathy, lumbosacral region: Secondary | ICD-10-CM | POA: Diagnosis not present

## 2015-12-24 DIAGNOSIS — Z96652 Presence of left artificial knee joint: Secondary | ICD-10-CM | POA: Diagnosis not present

## 2015-12-24 DIAGNOSIS — Z9181 History of falling: Secondary | ICD-10-CM | POA: Diagnosis not present

## 2015-12-24 DIAGNOSIS — I10 Essential (primary) hypertension: Secondary | ICD-10-CM | POA: Diagnosis not present

## 2015-12-25 DIAGNOSIS — M25562 Pain in left knee: Secondary | ICD-10-CM | POA: Diagnosis not present

## 2015-12-25 DIAGNOSIS — I10 Essential (primary) hypertension: Secondary | ICD-10-CM | POA: Diagnosis not present

## 2015-12-25 DIAGNOSIS — Z96652 Presence of left artificial knee joint: Secondary | ICD-10-CM | POA: Diagnosis not present

## 2015-12-25 DIAGNOSIS — Z9181 History of falling: Secondary | ICD-10-CM | POA: Diagnosis not present

## 2015-12-25 DIAGNOSIS — R2681 Unsteadiness on feet: Secondary | ICD-10-CM | POA: Diagnosis not present

## 2015-12-25 DIAGNOSIS — M5417 Radiculopathy, lumbosacral region: Secondary | ICD-10-CM | POA: Diagnosis not present

## 2015-12-30 DIAGNOSIS — M25562 Pain in left knee: Secondary | ICD-10-CM | POA: Diagnosis not present

## 2015-12-30 DIAGNOSIS — Z9181 History of falling: Secondary | ICD-10-CM | POA: Diagnosis not present

## 2015-12-30 DIAGNOSIS — M5417 Radiculopathy, lumbosacral region: Secondary | ICD-10-CM | POA: Diagnosis not present

## 2015-12-30 DIAGNOSIS — I10 Essential (primary) hypertension: Secondary | ICD-10-CM | POA: Diagnosis not present

## 2015-12-30 DIAGNOSIS — Z96652 Presence of left artificial knee joint: Secondary | ICD-10-CM | POA: Diagnosis not present

## 2015-12-30 DIAGNOSIS — R2681 Unsteadiness on feet: Secondary | ICD-10-CM | POA: Diagnosis not present

## 2015-12-31 DIAGNOSIS — I10 Essential (primary) hypertension: Secondary | ICD-10-CM | POA: Diagnosis not present

## 2015-12-31 DIAGNOSIS — R2681 Unsteadiness on feet: Secondary | ICD-10-CM | POA: Diagnosis not present

## 2015-12-31 DIAGNOSIS — Z9181 History of falling: Secondary | ICD-10-CM | POA: Diagnosis not present

## 2015-12-31 DIAGNOSIS — M5417 Radiculopathy, lumbosacral region: Secondary | ICD-10-CM | POA: Diagnosis not present

## 2015-12-31 DIAGNOSIS — Z96652 Presence of left artificial knee joint: Secondary | ICD-10-CM | POA: Diagnosis not present

## 2015-12-31 DIAGNOSIS — M25562 Pain in left knee: Secondary | ICD-10-CM | POA: Diagnosis not present

## 2016-01-01 DIAGNOSIS — M5417 Radiculopathy, lumbosacral region: Secondary | ICD-10-CM | POA: Diagnosis not present

## 2016-01-01 DIAGNOSIS — Z96652 Presence of left artificial knee joint: Secondary | ICD-10-CM | POA: Diagnosis not present

## 2016-01-01 DIAGNOSIS — I10 Essential (primary) hypertension: Secondary | ICD-10-CM | POA: Diagnosis not present

## 2016-01-01 DIAGNOSIS — R2681 Unsteadiness on feet: Secondary | ICD-10-CM | POA: Diagnosis not present

## 2016-01-01 DIAGNOSIS — Z9181 History of falling: Secondary | ICD-10-CM | POA: Diagnosis not present

## 2016-01-01 DIAGNOSIS — M25562 Pain in left knee: Secondary | ICD-10-CM | POA: Diagnosis not present

## 2016-01-02 DIAGNOSIS — I10 Essential (primary) hypertension: Secondary | ICD-10-CM | POA: Diagnosis not present

## 2016-01-02 DIAGNOSIS — M5417 Radiculopathy, lumbosacral region: Secondary | ICD-10-CM | POA: Diagnosis not present

## 2016-01-02 DIAGNOSIS — Z9181 History of falling: Secondary | ICD-10-CM | POA: Diagnosis not present

## 2016-01-02 DIAGNOSIS — Z96652 Presence of left artificial knee joint: Secondary | ICD-10-CM | POA: Diagnosis not present

## 2016-01-02 DIAGNOSIS — M25562 Pain in left knee: Secondary | ICD-10-CM | POA: Diagnosis not present

## 2016-01-02 DIAGNOSIS — R2681 Unsteadiness on feet: Secondary | ICD-10-CM | POA: Diagnosis not present

## 2016-01-06 DIAGNOSIS — Z96652 Presence of left artificial knee joint: Secondary | ICD-10-CM | POA: Diagnosis not present

## 2016-01-06 DIAGNOSIS — M25562 Pain in left knee: Secondary | ICD-10-CM | POA: Diagnosis not present

## 2016-01-06 DIAGNOSIS — R2681 Unsteadiness on feet: Secondary | ICD-10-CM | POA: Diagnosis not present

## 2016-01-06 DIAGNOSIS — Z9181 History of falling: Secondary | ICD-10-CM | POA: Diagnosis not present

## 2016-01-06 DIAGNOSIS — M5417 Radiculopathy, lumbosacral region: Secondary | ICD-10-CM | POA: Diagnosis not present

## 2016-01-06 DIAGNOSIS — I10 Essential (primary) hypertension: Secondary | ICD-10-CM | POA: Diagnosis not present

## 2016-01-07 DIAGNOSIS — R2681 Unsteadiness on feet: Secondary | ICD-10-CM | POA: Diagnosis not present

## 2016-01-07 DIAGNOSIS — Z9181 History of falling: Secondary | ICD-10-CM | POA: Diagnosis not present

## 2016-01-07 DIAGNOSIS — Z96652 Presence of left artificial knee joint: Secondary | ICD-10-CM | POA: Diagnosis not present

## 2016-01-07 DIAGNOSIS — I10 Essential (primary) hypertension: Secondary | ICD-10-CM | POA: Diagnosis not present

## 2016-01-07 DIAGNOSIS — M5417 Radiculopathy, lumbosacral region: Secondary | ICD-10-CM | POA: Diagnosis not present

## 2016-01-07 DIAGNOSIS — M25562 Pain in left knee: Secondary | ICD-10-CM | POA: Diagnosis not present

## 2016-01-08 DIAGNOSIS — R2681 Unsteadiness on feet: Secondary | ICD-10-CM | POA: Diagnosis not present

## 2016-01-08 DIAGNOSIS — M5417 Radiculopathy, lumbosacral region: Secondary | ICD-10-CM | POA: Diagnosis not present

## 2016-01-08 DIAGNOSIS — Z9181 History of falling: Secondary | ICD-10-CM | POA: Diagnosis not present

## 2016-01-08 DIAGNOSIS — Z96652 Presence of left artificial knee joint: Secondary | ICD-10-CM | POA: Diagnosis not present

## 2016-01-08 DIAGNOSIS — M25562 Pain in left knee: Secondary | ICD-10-CM | POA: Diagnosis not present

## 2016-01-08 DIAGNOSIS — I10 Essential (primary) hypertension: Secondary | ICD-10-CM | POA: Diagnosis not present

## 2016-01-09 DIAGNOSIS — I10 Essential (primary) hypertension: Secondary | ICD-10-CM | POA: Diagnosis not present

## 2016-01-09 DIAGNOSIS — Z9181 History of falling: Secondary | ICD-10-CM | POA: Diagnosis not present

## 2016-01-09 DIAGNOSIS — M25562 Pain in left knee: Secondary | ICD-10-CM | POA: Diagnosis not present

## 2016-01-09 DIAGNOSIS — Z96652 Presence of left artificial knee joint: Secondary | ICD-10-CM | POA: Diagnosis not present

## 2016-01-09 DIAGNOSIS — R2681 Unsteadiness on feet: Secondary | ICD-10-CM | POA: Diagnosis not present

## 2016-01-09 DIAGNOSIS — M5417 Radiculopathy, lumbosacral region: Secondary | ICD-10-CM | POA: Diagnosis not present

## 2016-01-12 DIAGNOSIS — Z96652 Presence of left artificial knee joint: Secondary | ICD-10-CM | POA: Diagnosis not present

## 2016-01-12 DIAGNOSIS — Z9181 History of falling: Secondary | ICD-10-CM | POA: Diagnosis not present

## 2016-01-12 DIAGNOSIS — I10 Essential (primary) hypertension: Secondary | ICD-10-CM | POA: Diagnosis not present

## 2016-01-12 DIAGNOSIS — R2681 Unsteadiness on feet: Secondary | ICD-10-CM | POA: Diagnosis not present

## 2016-01-12 DIAGNOSIS — M25562 Pain in left knee: Secondary | ICD-10-CM | POA: Diagnosis not present

## 2016-01-12 DIAGNOSIS — M5417 Radiculopathy, lumbosacral region: Secondary | ICD-10-CM | POA: Diagnosis not present

## 2016-01-14 DIAGNOSIS — Z0000 Encounter for general adult medical examination without abnormal findings: Secondary | ICD-10-CM | POA: Diagnosis not present

## 2016-01-14 DIAGNOSIS — E221 Hyperprolactinemia: Secondary | ICD-10-CM | POA: Diagnosis not present

## 2016-01-14 DIAGNOSIS — S51011A Laceration without foreign body of right elbow, initial encounter: Secondary | ICD-10-CM | POA: Diagnosis not present

## 2016-01-14 DIAGNOSIS — M545 Low back pain: Secondary | ICD-10-CM | POA: Diagnosis not present

## 2016-01-14 DIAGNOSIS — R351 Nocturia: Secondary | ICD-10-CM | POA: Diagnosis not present

## 2016-01-14 DIAGNOSIS — I10 Essential (primary) hypertension: Secondary | ICD-10-CM | POA: Diagnosis not present

## 2016-01-14 DIAGNOSIS — L570 Actinic keratosis: Secondary | ICD-10-CM | POA: Diagnosis not present

## 2016-01-14 DIAGNOSIS — M179 Osteoarthritis of knee, unspecified: Secondary | ICD-10-CM | POA: Diagnosis not present

## 2016-01-14 DIAGNOSIS — E7800 Pure hypercholesterolemia, unspecified: Secondary | ICD-10-CM | POA: Diagnosis not present

## 2016-01-15 DIAGNOSIS — R2681 Unsteadiness on feet: Secondary | ICD-10-CM | POA: Diagnosis not present

## 2016-01-15 DIAGNOSIS — M5417 Radiculopathy, lumbosacral region: Secondary | ICD-10-CM | POA: Diagnosis not present

## 2016-01-15 DIAGNOSIS — M25562 Pain in left knee: Secondary | ICD-10-CM | POA: Diagnosis not present

## 2016-01-15 DIAGNOSIS — Z96652 Presence of left artificial knee joint: Secondary | ICD-10-CM | POA: Diagnosis not present

## 2016-01-15 DIAGNOSIS — Z9181 History of falling: Secondary | ICD-10-CM | POA: Diagnosis not present

## 2016-01-15 DIAGNOSIS — I10 Essential (primary) hypertension: Secondary | ICD-10-CM | POA: Diagnosis not present

## 2016-01-16 DIAGNOSIS — Z96652 Presence of left artificial knee joint: Secondary | ICD-10-CM | POA: Diagnosis not present

## 2016-01-16 DIAGNOSIS — M25562 Pain in left knee: Secondary | ICD-10-CM | POA: Diagnosis not present

## 2016-01-16 DIAGNOSIS — M5417 Radiculopathy, lumbosacral region: Secondary | ICD-10-CM | POA: Diagnosis not present

## 2016-01-16 DIAGNOSIS — Z9181 History of falling: Secondary | ICD-10-CM | POA: Diagnosis not present

## 2016-01-16 DIAGNOSIS — I10 Essential (primary) hypertension: Secondary | ICD-10-CM | POA: Diagnosis not present

## 2016-01-16 DIAGNOSIS — R2681 Unsteadiness on feet: Secondary | ICD-10-CM | POA: Diagnosis not present

## 2016-01-20 DIAGNOSIS — Z9181 History of falling: Secondary | ICD-10-CM | POA: Diagnosis not present

## 2016-01-20 DIAGNOSIS — I10 Essential (primary) hypertension: Secondary | ICD-10-CM | POA: Diagnosis not present

## 2016-01-20 DIAGNOSIS — Z96652 Presence of left artificial knee joint: Secondary | ICD-10-CM | POA: Diagnosis not present

## 2016-01-20 DIAGNOSIS — R2681 Unsteadiness on feet: Secondary | ICD-10-CM | POA: Diagnosis not present

## 2016-01-20 DIAGNOSIS — M5417 Radiculopathy, lumbosacral region: Secondary | ICD-10-CM | POA: Diagnosis not present

## 2016-01-20 DIAGNOSIS — M25562 Pain in left knee: Secondary | ICD-10-CM | POA: Diagnosis not present

## 2016-01-21 DIAGNOSIS — I10 Essential (primary) hypertension: Secondary | ICD-10-CM | POA: Diagnosis not present

## 2016-01-21 DIAGNOSIS — E221 Hyperprolactinemia: Secondary | ICD-10-CM | POA: Diagnosis not present

## 2016-01-21 DIAGNOSIS — E7800 Pure hypercholesterolemia, unspecified: Secondary | ICD-10-CM | POA: Diagnosis not present

## 2016-01-21 DIAGNOSIS — L570 Actinic keratosis: Secondary | ICD-10-CM | POA: Diagnosis not present

## 2016-01-21 DIAGNOSIS — M545 Low back pain: Secondary | ICD-10-CM | POA: Diagnosis not present

## 2016-01-21 DIAGNOSIS — Z0000 Encounter for general adult medical examination without abnormal findings: Secondary | ICD-10-CM | POA: Diagnosis not present

## 2016-01-21 DIAGNOSIS — S51011A Laceration without foreign body of right elbow, initial encounter: Secondary | ICD-10-CM | POA: Diagnosis not present

## 2016-01-21 DIAGNOSIS — R351 Nocturia: Secondary | ICD-10-CM | POA: Diagnosis not present

## 2016-01-21 DIAGNOSIS — M179 Osteoarthritis of knee, unspecified: Secondary | ICD-10-CM | POA: Diagnosis not present

## 2016-01-23 DIAGNOSIS — I10 Essential (primary) hypertension: Secondary | ICD-10-CM | POA: Diagnosis not present

## 2016-01-23 DIAGNOSIS — R2681 Unsteadiness on feet: Secondary | ICD-10-CM | POA: Diagnosis not present

## 2016-01-23 DIAGNOSIS — Z96652 Presence of left artificial knee joint: Secondary | ICD-10-CM | POA: Diagnosis not present

## 2016-01-23 DIAGNOSIS — M5417 Radiculopathy, lumbosacral region: Secondary | ICD-10-CM | POA: Diagnosis not present

## 2016-01-23 DIAGNOSIS — M25562 Pain in left knee: Secondary | ICD-10-CM | POA: Diagnosis not present

## 2016-01-23 DIAGNOSIS — Z9181 History of falling: Secondary | ICD-10-CM | POA: Diagnosis not present

## 2016-01-27 DIAGNOSIS — R2681 Unsteadiness on feet: Secondary | ICD-10-CM | POA: Diagnosis not present

## 2016-01-27 DIAGNOSIS — Z96652 Presence of left artificial knee joint: Secondary | ICD-10-CM | POA: Diagnosis not present

## 2016-01-27 DIAGNOSIS — Z9181 History of falling: Secondary | ICD-10-CM | POA: Diagnosis not present

## 2016-01-27 DIAGNOSIS — M5417 Radiculopathy, lumbosacral region: Secondary | ICD-10-CM | POA: Diagnosis not present

## 2016-01-27 DIAGNOSIS — I10 Essential (primary) hypertension: Secondary | ICD-10-CM | POA: Diagnosis not present

## 2016-01-27 DIAGNOSIS — M25562 Pain in left knee: Secondary | ICD-10-CM | POA: Diagnosis not present

## 2016-01-29 DIAGNOSIS — M25562 Pain in left knee: Secondary | ICD-10-CM | POA: Diagnosis not present

## 2016-01-29 DIAGNOSIS — M5417 Radiculopathy, lumbosacral region: Secondary | ICD-10-CM | POA: Diagnosis not present

## 2016-01-29 DIAGNOSIS — R2681 Unsteadiness on feet: Secondary | ICD-10-CM | POA: Diagnosis not present

## 2016-01-29 DIAGNOSIS — Z96652 Presence of left artificial knee joint: Secondary | ICD-10-CM | POA: Diagnosis not present

## 2016-01-29 DIAGNOSIS — I10 Essential (primary) hypertension: Secondary | ICD-10-CM | POA: Diagnosis not present

## 2016-01-29 DIAGNOSIS — Z9181 History of falling: Secondary | ICD-10-CM | POA: Diagnosis not present

## 2016-02-03 DIAGNOSIS — I10 Essential (primary) hypertension: Secondary | ICD-10-CM | POA: Diagnosis not present

## 2016-02-03 DIAGNOSIS — R2681 Unsteadiness on feet: Secondary | ICD-10-CM | POA: Diagnosis not present

## 2016-02-03 DIAGNOSIS — M25562 Pain in left knee: Secondary | ICD-10-CM | POA: Diagnosis not present

## 2016-02-03 DIAGNOSIS — Z9181 History of falling: Secondary | ICD-10-CM | POA: Diagnosis not present

## 2016-02-03 DIAGNOSIS — M5417 Radiculopathy, lumbosacral region: Secondary | ICD-10-CM | POA: Diagnosis not present

## 2016-02-03 DIAGNOSIS — Z96652 Presence of left artificial knee joint: Secondary | ICD-10-CM | POA: Diagnosis not present

## 2016-02-06 DIAGNOSIS — Z9181 History of falling: Secondary | ICD-10-CM | POA: Diagnosis not present

## 2016-02-06 DIAGNOSIS — M25562 Pain in left knee: Secondary | ICD-10-CM | POA: Diagnosis not present

## 2016-02-06 DIAGNOSIS — I10 Essential (primary) hypertension: Secondary | ICD-10-CM | POA: Diagnosis not present

## 2016-02-06 DIAGNOSIS — R2681 Unsteadiness on feet: Secondary | ICD-10-CM | POA: Diagnosis not present

## 2016-02-06 DIAGNOSIS — Z96652 Presence of left artificial knee joint: Secondary | ICD-10-CM | POA: Diagnosis not present

## 2016-02-06 DIAGNOSIS — M5417 Radiculopathy, lumbosacral region: Secondary | ICD-10-CM | POA: Diagnosis not present

## 2016-02-10 DIAGNOSIS — I10 Essential (primary) hypertension: Secondary | ICD-10-CM | POA: Diagnosis not present

## 2016-02-10 DIAGNOSIS — Z9181 History of falling: Secondary | ICD-10-CM | POA: Diagnosis not present

## 2016-02-10 DIAGNOSIS — Z96652 Presence of left artificial knee joint: Secondary | ICD-10-CM | POA: Diagnosis not present

## 2016-02-10 DIAGNOSIS — M5417 Radiculopathy, lumbosacral region: Secondary | ICD-10-CM | POA: Diagnosis not present

## 2016-02-10 DIAGNOSIS — M25562 Pain in left knee: Secondary | ICD-10-CM | POA: Diagnosis not present

## 2016-02-10 DIAGNOSIS — R2681 Unsteadiness on feet: Secondary | ICD-10-CM | POA: Diagnosis not present

## 2016-02-12 DIAGNOSIS — M5417 Radiculopathy, lumbosacral region: Secondary | ICD-10-CM | POA: Diagnosis not present

## 2016-02-12 DIAGNOSIS — R2681 Unsteadiness on feet: Secondary | ICD-10-CM | POA: Diagnosis not present

## 2016-02-12 DIAGNOSIS — I10 Essential (primary) hypertension: Secondary | ICD-10-CM | POA: Diagnosis not present

## 2016-02-12 DIAGNOSIS — M25562 Pain in left knee: Secondary | ICD-10-CM | POA: Diagnosis not present

## 2016-02-12 DIAGNOSIS — Z96652 Presence of left artificial knee joint: Secondary | ICD-10-CM | POA: Diagnosis not present

## 2016-02-12 DIAGNOSIS — Z9181 History of falling: Secondary | ICD-10-CM | POA: Diagnosis not present

## 2016-03-05 DIAGNOSIS — R351 Nocturia: Secondary | ICD-10-CM | POA: Diagnosis not present

## 2016-03-05 DIAGNOSIS — Z23 Encounter for immunization: Secondary | ICD-10-CM | POA: Diagnosis not present

## 2016-03-05 DIAGNOSIS — M4800 Spinal stenosis, site unspecified: Secondary | ICD-10-CM | POA: Diagnosis not present

## 2016-03-05 DIAGNOSIS — I10 Essential (primary) hypertension: Secondary | ICD-10-CM | POA: Diagnosis not present

## 2016-03-05 DIAGNOSIS — M179 Osteoarthritis of knee, unspecified: Secondary | ICD-10-CM | POA: Diagnosis not present

## 2016-03-10 DIAGNOSIS — M25561 Pain in right knee: Secondary | ICD-10-CM | POA: Diagnosis not present

## 2016-05-12 DIAGNOSIS — M25561 Pain in right knee: Secondary | ICD-10-CM | POA: Diagnosis not present

## 2016-05-24 DIAGNOSIS — R413 Other amnesia: Secondary | ICD-10-CM | POA: Diagnosis not present

## 2016-05-24 DIAGNOSIS — M179 Osteoarthritis of knee, unspecified: Secondary | ICD-10-CM | POA: Diagnosis not present

## 2016-05-24 DIAGNOSIS — I10 Essential (primary) hypertension: Secondary | ICD-10-CM | POA: Diagnosis not present

## 2016-05-25 DIAGNOSIS — M25561 Pain in right knee: Secondary | ICD-10-CM | POA: Diagnosis not present

## 2016-05-25 DIAGNOSIS — R278 Other lack of coordination: Secondary | ICD-10-CM | POA: Diagnosis not present

## 2016-05-25 DIAGNOSIS — R2689 Other abnormalities of gait and mobility: Secondary | ICD-10-CM | POA: Diagnosis not present

## 2016-05-25 DIAGNOSIS — Z9181 History of falling: Secondary | ICD-10-CM | POA: Diagnosis not present

## 2016-05-25 DIAGNOSIS — R2681 Unsteadiness on feet: Secondary | ICD-10-CM | POA: Diagnosis not present

## 2016-05-25 DIAGNOSIS — M6281 Muscle weakness (generalized): Secondary | ICD-10-CM | POA: Diagnosis not present

## 2016-05-27 DIAGNOSIS — R2689 Other abnormalities of gait and mobility: Secondary | ICD-10-CM | POA: Diagnosis not present

## 2016-05-27 DIAGNOSIS — M25561 Pain in right knee: Secondary | ICD-10-CM | POA: Diagnosis not present

## 2016-05-27 DIAGNOSIS — R2681 Unsteadiness on feet: Secondary | ICD-10-CM | POA: Diagnosis not present

## 2016-05-27 DIAGNOSIS — R278 Other lack of coordination: Secondary | ICD-10-CM | POA: Diagnosis not present

## 2016-05-27 DIAGNOSIS — M6281 Muscle weakness (generalized): Secondary | ICD-10-CM | POA: Diagnosis not present

## 2016-05-27 DIAGNOSIS — Z9181 History of falling: Secondary | ICD-10-CM | POA: Diagnosis not present

## 2016-05-28 DIAGNOSIS — Z9181 History of falling: Secondary | ICD-10-CM | POA: Diagnosis not present

## 2016-05-28 DIAGNOSIS — R2689 Other abnormalities of gait and mobility: Secondary | ICD-10-CM | POA: Diagnosis not present

## 2016-05-28 DIAGNOSIS — M25561 Pain in right knee: Secondary | ICD-10-CM | POA: Diagnosis not present

## 2016-05-28 DIAGNOSIS — R2681 Unsteadiness on feet: Secondary | ICD-10-CM | POA: Diagnosis not present

## 2016-05-28 DIAGNOSIS — R278 Other lack of coordination: Secondary | ICD-10-CM | POA: Diagnosis not present

## 2016-05-28 DIAGNOSIS — M6281 Muscle weakness (generalized): Secondary | ICD-10-CM | POA: Diagnosis not present

## 2016-05-31 DIAGNOSIS — Z8739 Personal history of other diseases of the musculoskeletal system and connective tissue: Secondary | ICD-10-CM

## 2016-05-31 DIAGNOSIS — Z9181 History of falling: Secondary | ICD-10-CM | POA: Diagnosis not present

## 2016-05-31 DIAGNOSIS — R2681 Unsteadiness on feet: Secondary | ICD-10-CM | POA: Diagnosis not present

## 2016-05-31 DIAGNOSIS — I10 Essential (primary) hypertension: Secondary | ICD-10-CM | POA: Diagnosis present

## 2016-05-31 DIAGNOSIS — R2689 Other abnormalities of gait and mobility: Secondary | ICD-10-CM | POA: Diagnosis not present

## 2016-05-31 DIAGNOSIS — M1711 Unilateral primary osteoarthritis, right knee: Secondary | ICD-10-CM | POA: Diagnosis present

## 2016-05-31 DIAGNOSIS — R278 Other lack of coordination: Secondary | ICD-10-CM | POA: Diagnosis not present

## 2016-05-31 DIAGNOSIS — M25561 Pain in right knee: Secondary | ICD-10-CM | POA: Diagnosis not present

## 2016-05-31 DIAGNOSIS — M6281 Muscle weakness (generalized): Secondary | ICD-10-CM | POA: Diagnosis not present

## 2016-05-31 NOTE — H&P (Signed)
PREOPERATIVE H&P Patient ID: William Skinner MRN: [id number] DOB/AGE: 1926/07/17 82 y.o.  Chief Complaint: OA RIGHT KNEE  Planned Procedure Date: 06/15/16 Medical and Cardiac Clearance by Dr. Harle Battiest    HPI: [name] is a 81 y.o. male who presents for evaluation of OA RIGHT KNEE. History of left TKA by Letitia Caul, [place]. History of carotid endarterectomy, left 2011.   The patient has a history of pain and functional disability in the right knee due to arthritis and has failed non-surgical conservative treatments for greater than 12 weeks to include NSAID's and/or analgesics, corticosteriod injections, use of assistive devices and activity modification.  Onset of symptoms was gradual, starting 4 years ago with gradually worsening course since that time.  Patient currently rates pain at 8 out of 10 with activity. Patient has night pain, worsening of pain with activity and weight bearing and pain that interferes with activities of daily living.  Patient has evidence of severe anteriormedial arthritis, varus deformity, subchondral sclerosis and joint space narrowing . He had a good medial opening on stress view. There is no active infection.  Past Medical History:  Diagnosis Date  . Back pain   . Carotid artery occlusion   . Hyperlipidemia   . Hypertension   . Right leg pain    Past Surgical History:  Procedure Laterality Date  . CAROTID ENDARTERECTOMY Left 2011   . CATARACT EXTRACTION Bilateral   . HERNIA REPAIR    . KNEE SURGERY Left 2014   . NOSE SURGERY     No Known Allergies   Medicines: Aspirin 81 mg 1 daily.  Losartan potassium 100 mg 1 daily.  Flomax 0.4 mg-2 capsules per day.  Meloxicam 15 mg 1 daily.  Omeprazole 20 mg 1 daily. Selenium 100 g 1 daily. Co-Q10 100 mg 1 daily Vitamin D 1000 units 1 daily Vitamin D 200 units 1 daily Vitamin C 500 mg one daily 30 mg zinc 1 daily  Social History: Widowed former smoker-20-pack-year history, quit in  1972. One alcoholic drink per month.  Family History: Non-contributory.  ROS: Currently denies lightheadedness, dizziness, Fever, chills, CP, SOB.   No personal history of DVT, PE, MI, or CVA. All other systems have been reviewed and were otherwise currently negative with the exception of those mentioned in the HPI and as above.  Objective: Vitals: Ht: 5'7" Wt: 198 Temp: 98.5 BP: 157/73  Pulse: 65 O2 93 % on room air. Physical Exam: General: Frail appearing.  Alert, NAD.  In wheelchair today - does ambulate independently with a walker at home. HEENT: EOMI, Good Neck Extension.   Pulm: No increased work of breathing.  Clear B/L A/P w/o crackle or wheeze.  CV: RRR, No m/g/r appreciated  GI: Protuberant, soft, NT, ND Neuro: Neuro grossly intact b/l upper/lower ext.  Sensation intact distally Skin: No lesions in the area of chief complaint MSK/Surgical Site: Right knee w/o redness or effusion.  + medial JLT. ROM 5-100.  4/5 strength in extension and flexion.  +EHL/FHL.  NVI.  Stable Lachman's and varus and valgus stress.    Imaging Review Patient has evidence of severe anteriormedial arthritis, varus deformity, subchondral sclerosis and joint space narrowing by plain radiograph.   Assessment: OA RIGHT KNEE Principal Problem:   Primary osteoarthritis of right knee Active Problems:   Essential hypertension   H/O spinal stenosis   H/O carotid endarterectomy   Plan: Plan for Procedure(s): RIGHT UNICOMPARTMENTAL Versus TOTAL KNEE ARTHROPLASTY  The patient history, physical exam,  clinical judgement of the provider and imaging are consistent with end stage degenerative joint disease and Unicompartmental versus total joint arthroplasty is deemed medically necessary. The treatment options including medical management, injection therapy, and arthroplasty were discussed at length. The risks and benefits of Procedure(s): RIGHT UNICOMPARTMENTAL Versus TOTAL KNEE ARTHROPLASTY were presented and  reviewed.  The risks of nonoperative treatment, versus surgical intervention including but not limited to continued pain, aseptic loosening, stiffness, dislocation/subluxation, infection, bleeding, nerve injury, blood clots, cardiopulmonary complications, morbidity, mortality, among others were discussed. The patient verbalizes understanding and wishes to proceed with the plan.  Patient is being admitted for inpatient treatment for surgery, pain control, PT, OT, prophylactic antibiotics, VTE prophylaxis, progressive ambulation, ADL's and discharge planning.   Dental prophylaxis discussed and recommended for 2 years postoperatively.  The patient does meet the criteria for TXA which will be used perioperatively via IV.   Xarelto  will be used postoperatively for DVT prophylaxis in addition to SCDs, and early ambulation.  Dt h/o carotid disease. The patient is planning to be discharged home with home health services or SNF ([facility]) depending on surgery performed and recovery progress.  [facility].  Prudencio Burly III, PA-C 06/01/2016 9:34 AM

## 2016-06-01 DIAGNOSIS — Z9181 History of falling: Secondary | ICD-10-CM | POA: Diagnosis not present

## 2016-06-01 DIAGNOSIS — R2689 Other abnormalities of gait and mobility: Secondary | ICD-10-CM | POA: Diagnosis not present

## 2016-06-01 DIAGNOSIS — Z9889 Other specified postprocedural states: Secondary | ICD-10-CM

## 2016-06-01 DIAGNOSIS — R2681 Unsteadiness on feet: Secondary | ICD-10-CM | POA: Diagnosis not present

## 2016-06-01 DIAGNOSIS — R278 Other lack of coordination: Secondary | ICD-10-CM | POA: Diagnosis not present

## 2016-06-01 DIAGNOSIS — M25561 Pain in right knee: Secondary | ICD-10-CM | POA: Diagnosis not present

## 2016-06-01 DIAGNOSIS — M6281 Muscle weakness (generalized): Secondary | ICD-10-CM | POA: Diagnosis not present

## 2016-06-02 DIAGNOSIS — R278 Other lack of coordination: Secondary | ICD-10-CM | POA: Diagnosis not present

## 2016-06-02 DIAGNOSIS — Z9181 History of falling: Secondary | ICD-10-CM | POA: Diagnosis not present

## 2016-06-02 DIAGNOSIS — M6281 Muscle weakness (generalized): Secondary | ICD-10-CM | POA: Diagnosis not present

## 2016-06-02 DIAGNOSIS — M25561 Pain in right knee: Secondary | ICD-10-CM | POA: Diagnosis not present

## 2016-06-02 DIAGNOSIS — R2681 Unsteadiness on feet: Secondary | ICD-10-CM | POA: Diagnosis not present

## 2016-06-02 DIAGNOSIS — R2689 Other abnormalities of gait and mobility: Secondary | ICD-10-CM | POA: Diagnosis not present

## 2016-06-03 ENCOUNTER — Encounter (HOSPITAL_COMMUNITY): Payer: Self-pay

## 2016-06-03 ENCOUNTER — Encounter (HOSPITAL_COMMUNITY)
Admission: RE | Admit: 2016-06-03 | Discharge: 2016-06-03 | Disposition: A | Discharge status: Home / Self Care | Payer: Medicare Other | Source: Ambulatory Visit | Attending: Orthopedic Surgery | Admitting: Orthopedic Surgery

## 2016-06-03 DIAGNOSIS — M6281 Muscle weakness (generalized): Secondary | ICD-10-CM | POA: Diagnosis not present

## 2016-06-03 DIAGNOSIS — Z01812 Encounter for preprocedural laboratory examination: Secondary | ICD-10-CM | POA: Diagnosis not present

## 2016-06-03 DIAGNOSIS — Z9181 History of falling: Secondary | ICD-10-CM | POA: Diagnosis not present

## 2016-06-03 DIAGNOSIS — R278 Other lack of coordination: Secondary | ICD-10-CM | POA: Diagnosis not present

## 2016-06-03 DIAGNOSIS — I10 Essential (primary) hypertension: Secondary | ICD-10-CM | POA: Insufficient documentation

## 2016-06-03 DIAGNOSIS — Z01810 Encounter for preprocedural cardiovascular examination: Secondary | ICD-10-CM | POA: Diagnosis not present

## 2016-06-03 DIAGNOSIS — R2681 Unsteadiness on feet: Secondary | ICD-10-CM | POA: Diagnosis not present

## 2016-06-03 DIAGNOSIS — M25561 Pain in right knee: Secondary | ICD-10-CM | POA: Diagnosis not present

## 2016-06-03 DIAGNOSIS — R2689 Other abnormalities of gait and mobility: Secondary | ICD-10-CM | POA: Diagnosis not present

## 2016-06-03 HISTORY — DX: Unilateral primary osteoarthritis, unspecified knee: M17.10

## 2016-06-03 LAB — CBC
HEMATOCRIT: 38.8 % — AB (ref 39.0–52.0)
Hemoglobin: 12.7 g/dL — ABNORMAL LOW (ref 13.0–17.0)
MCH: 31.1 pg (ref 26.0–34.0)
MCHC: 32.7 g/dL (ref 30.0–36.0)
MCV: 95.1 fL (ref 78.0–100.0)
Platelets: 168 10*3/uL (ref 150–400)
RBC: 4.08 MIL/uL — ABNORMAL LOW (ref 4.22–5.81)
RDW: 13.5 % (ref 11.5–15.5)
WBC: 5.5 10*3/uL (ref 4.0–10.5)

## 2016-06-03 LAB — URINALYSIS, ROUTINE W REFLEX MICROSCOPIC
Bilirubin Urine: NEGATIVE
GLUCOSE, UA: NEGATIVE mg/dL
Hgb urine dipstick: NEGATIVE
KETONES UR: NEGATIVE mg/dL
LEUKOCYTES UA: NEGATIVE
NITRITE: NEGATIVE
PROTEIN: NEGATIVE mg/dL
Specific Gravity, Urine: 1.015 (ref 1.005–1.030)
pH: 5.5 (ref 5.0–8.0)

## 2016-06-03 LAB — TYPE AND SCREEN
ABO/RH(D): O NEG
Antibody Screen: NEGATIVE

## 2016-06-03 LAB — SURGICAL PCR SCREEN
MRSA, PCR: NEGATIVE
STAPHYLOCOCCUS AUREUS: POSITIVE — AB

## 2016-06-03 LAB — BASIC METABOLIC PANEL
Anion gap: 9 (ref 5–15)
BUN: 27 mg/dL — AB (ref 6–20)
CALCIUM: 9.2 mg/dL (ref 8.9–10.3)
CO2: 21 mmol/L — AB (ref 22–32)
Chloride: 105 mmol/L (ref 101–111)
Creatinine, Ser: 1.05 mg/dL (ref 0.61–1.24)
GFR calc Af Amer: >60 mL/min (ref >60)
GLUCOSE: 102 mg/dL — AB (ref 65–99)
Potassium: 4.3 mmol/L (ref 3.5–5.1)
Sodium: 135 mmol/L (ref 135–145)

## 2016-06-03 LAB — PROTIME-INR
INR: 1.13
PROTHROMBIN TIME: 14.6 s (ref 11.4–15.2)

## 2016-06-03 LAB — APTT: APTT: 36 s (ref 24–36)

## 2016-06-03 LAB — ABO/RH: ABO/RH(D): O NEG

## 2016-06-03 NOTE — Progress Notes (Signed)
Pt denies SOB, chest pain, and being under the care of a cardiologist. Pt denies having a stress test, echo and cardiac cath. Pt denies having an EKG and chest x ray within the last year. 

## 2016-06-03 NOTE — Progress Notes (Signed)
A voice message was left on office answering machine requesting clarification of order for consent, Ancef and if x ray of knee should be done at PAT; awaiting a response. Pt denies having any recent labs. Pt chart forwarded to anesthesia to review EKG.

## 2016-06-03 NOTE — Pre-Procedure Instructions (Signed)
William Skinner  06/03/2016      [facility], [place] [address] Phone: [phone] Fax: [phone]    Your procedure is scheduled on Tuesday, June 15, 2016  Report to [facility] Admitting at 10.00 A.M.  Call this number if you have problems the morning of surgery:  [phone]   Remember:  Do not eat food or drink liquids after midnight Monday, June 14, 2016  Take these medicines the morning of surgery with A SIP OF WATER: omeprazole (PRILOSEC), tamsulosin [facility]),  Stop taking Aspirin, all vitamins, and herbal medications Co-Enzyme Q-10 . Do not take any NSAIDs ie: Ibuprofen, Advil, Naproxen or any medication containing Aspirin such as Meloxicam ( Mobic); stop Tuesday, June 08, 2016  Do not wear jewelry, make-up or nail polish.  Do not wear lotions, powders, or perfumes, or deoderant.  Do not shave 48 hours prior to surgery.  Men may shave face and neck.  Do not bring valuables to the hospital.  [facility] is not responsible for any belongings or valuables.  Contacts, dentures or bridgework may not be worn into surgery.  Leave your suitcase in the car.  After surgery it may be brought to your room.  For patients admitted to the hospital, discharge time will be determined by your treatment team. Special instructions:   Special Instructions:Special Instructions: [facility] - Preparing for Surgery  Before surgery, you can play an important role.  Because skin is not sterile, your skin needs to be as free of germs as possible.  You can reduce the number of germs on you skin by washing with CHG (chlorahexidine gluconate) soap before surgery.  CHG is an antiseptic cleaner which kills germs and bonds with the skin to continue killing germs even after washing.  Please DO NOT use if you have an allergy to CHG or antibacterial soaps.  If your skin becomes  reddened/irritated stop using the CHG and inform your nurse when you arrive at Short Stay.  Do not shave (including legs and underarms) for at least 48 hours prior to the first CHG shower.  You may shave your face.  Please follow these instructions carefully:   1.  Shower with CHG Soap the night before surgery and the morning of Surgery.  2.  If you choose to wash your hair, wash your hair first as usual with your normal shampoo.  3.  After you shampoo, rinse your hair and body thoroughly to remove the Shampoo.  4.  Use CHG as you would any other liquid soap.  You can apply chg directly  to the skin and wash gently with scrungie or a clean washcloth.  5.  Apply the CHG Soap to your body ONLY FROM THE NECK DOWN.  Do not use on open wounds or open sores.  Avoid contact with your eyes, ears, mouth and genitals (private parts).  Wash genitals (private parts) with your normal soap.  6.  Wash thoroughly, paying special attention to the area where your surgery will be performed.  7.  Thoroughly rinse your body with warm water from the neck down.  8.  DO NOT shower/wash with your normal soap after using and rinsing off the CHG Soap.  9.  Pat yourself dry with a clean towel.            10.  Wear clean pajamas.  11.  Place clean sheets on your bed the night of your first shower and do not sleep with pets.  Day of Surgery  Do not apply any lotions/deoderants the morning of surgery.  Please wear clean clothes to the hospital/surgery center.  Please read over the following fact sheets that you were given. Pain Booklet, Coughing and Deep Breathing, Blood Transfusion Information, Total Joint Packet, MRSA Information and Surgical Site Infection Prevention

## 2016-06-04 ENCOUNTER — Other Ambulatory Visit (HOSPITAL_COMMUNITY): Payer: Medicare Other

## 2016-06-04 DIAGNOSIS — M6281 Muscle weakness (generalized): Secondary | ICD-10-CM | POA: Diagnosis not present

## 2016-06-04 DIAGNOSIS — R2681 Unsteadiness on feet: Secondary | ICD-10-CM | POA: Diagnosis not present

## 2016-06-04 DIAGNOSIS — M25561 Pain in right knee: Secondary | ICD-10-CM | POA: Diagnosis not present

## 2016-06-04 DIAGNOSIS — R278 Other lack of coordination: Secondary | ICD-10-CM | POA: Diagnosis not present

## 2016-06-04 DIAGNOSIS — R2689 Other abnormalities of gait and mobility: Secondary | ICD-10-CM | POA: Diagnosis not present

## 2016-06-04 DIAGNOSIS — Z9181 History of falling: Secondary | ICD-10-CM | POA: Diagnosis not present

## 2016-06-04 LAB — URINE CULTURE: Culture: <10000 — AB

## 2016-06-07 DIAGNOSIS — R2689 Other abnormalities of gait and mobility: Secondary | ICD-10-CM | POA: Diagnosis not present

## 2016-06-07 DIAGNOSIS — M6281 Muscle weakness (generalized): Secondary | ICD-10-CM | POA: Diagnosis not present

## 2016-06-07 DIAGNOSIS — R2681 Unsteadiness on feet: Secondary | ICD-10-CM | POA: Diagnosis not present

## 2016-06-07 DIAGNOSIS — Z9181 History of falling: Secondary | ICD-10-CM | POA: Diagnosis not present

## 2016-06-07 DIAGNOSIS — R278 Other lack of coordination: Secondary | ICD-10-CM | POA: Diagnosis not present

## 2016-06-07 DIAGNOSIS — M25561 Pain in right knee: Secondary | ICD-10-CM | POA: Diagnosis not present

## 2016-06-07 NOTE — Progress Notes (Signed)
Anesthesia Chart Review: Patient is a 82 year old male scheduled for right TKA on 06/15/16 by Dr. Edmonia Lynch.  History includes former smoker, HTN, carotid artery occlusive disease s/p left CEA (date not specified, but prior to 02/2014), HLD, back pain, osteoarthritis, tonsillectomy, hernia repair (not specified), nose surgery.  PCP is Dr. Lawerance Cruel. He denied being under the care of a cardiologist and denied prior stress, echo, or cath.   Meds include ASA 81 mg, losartan, Prilosec, selenium, Flomax, vitamin E and C.  BP 138/65   Pulse 79   Temp 36.9 C   Resp 18   Ht 5\' 10"  (1.778 m)   Wt 198 lb 9.6 oz (90.1 kg)   SpO2 94%   BMI 28.50 kg/m   06/03/16 EKG: SR, frequent PVCs, right BBB. Small q waves in III and aVF. No comparison tracings in Epic or Muse. Patient denied SOB, chest pain.   Preoperative labs noted. Cr 1.05. H/H 12.7/38.8. PT/PTT WNL. Glucose 102. UA cloudy, but negative for nitrites and leukocytes. Culture showed insignificant growth. T&S done.  Above reviewed with anesthesiologist Dr. Smith Harsh by Jonah Blue, FNP-BC. If patient remains asymptomatic from a CV standpoint then it is anticipated that he can proceed as planned.  [facility] Short Stay Center/Anesthesiology Phone [phone] 06/07/2016 3:58 PM

## 2016-06-08 DIAGNOSIS — R278 Other lack of coordination: Secondary | ICD-10-CM | POA: Diagnosis not present

## 2016-06-08 DIAGNOSIS — R2681 Unsteadiness on feet: Secondary | ICD-10-CM | POA: Diagnosis not present

## 2016-06-08 DIAGNOSIS — M6281 Muscle weakness (generalized): Secondary | ICD-10-CM | POA: Diagnosis not present

## 2016-06-08 DIAGNOSIS — R2689 Other abnormalities of gait and mobility: Secondary | ICD-10-CM | POA: Diagnosis not present

## 2016-06-08 DIAGNOSIS — Z9181 History of falling: Secondary | ICD-10-CM | POA: Diagnosis not present

## 2016-06-08 DIAGNOSIS — M25561 Pain in right knee: Secondary | ICD-10-CM | POA: Diagnosis not present

## 2016-06-09 DIAGNOSIS — R278 Other lack of coordination: Secondary | ICD-10-CM | POA: Diagnosis not present

## 2016-06-09 DIAGNOSIS — M25561 Pain in right knee: Secondary | ICD-10-CM | POA: Diagnosis not present

## 2016-06-09 DIAGNOSIS — R2681 Unsteadiness on feet: Secondary | ICD-10-CM | POA: Diagnosis not present

## 2016-06-09 DIAGNOSIS — Z9181 History of falling: Secondary | ICD-10-CM | POA: Diagnosis not present

## 2016-06-09 DIAGNOSIS — R2689 Other abnormalities of gait and mobility: Secondary | ICD-10-CM | POA: Diagnosis not present

## 2016-06-09 DIAGNOSIS — M6281 Muscle weakness (generalized): Secondary | ICD-10-CM | POA: Diagnosis not present

## 2016-06-14 MED ORDER — TRANEXAMIC ACID 1000 MG/10ML IV SOLN
1000.0000 mg | INTRAVENOUS | Status: AC
  Administered 2016-06-15: 1000 mg via INTRAVENOUS
  Filled 2016-06-14: qty 10

## 2016-06-15 ENCOUNTER — Inpatient Hospital Stay (HOSPITAL_COMMUNITY): Payer: Medicare Other | Admitting: Anesthesiology

## 2016-06-15 ENCOUNTER — Inpatient Hospital Stay (HOSPITAL_COMMUNITY): Payer: Medicare Other | Admitting: Vascular Surgery

## 2016-06-15 ENCOUNTER — Inpatient Hospital Stay (HOSPITAL_COMMUNITY)
Admission: RE | Admit: 2016-06-15 | Discharge: 2016-06-17 | DRG: 470 | Disposition: A | Discharge status: Skilled Nursing Facility | Payer: Medicare Other | Source: Ambulatory Visit | Attending: Orthopedic Surgery | Admitting: Orthopedic Surgery

## 2016-06-15 ENCOUNTER — Encounter (HOSPITAL_COMMUNITY): Payer: Self-pay | Admitting: Anesthesiology

## 2016-06-15 ENCOUNTER — Inpatient Hospital Stay (HOSPITAL_COMMUNITY): Payer: Medicare Other

## 2016-06-15 ENCOUNTER — Encounter (HOSPITAL_COMMUNITY)
Admission: RE | Disposition: A | Discharge status: Skilled Nursing Facility | Payer: Self-pay | Source: Ambulatory Visit | Attending: Orthopedic Surgery

## 2016-06-15 DIAGNOSIS — Z87891 Personal history of nicotine dependence: Secondary | ICD-10-CM | POA: Diagnosis not present

## 2016-06-15 DIAGNOSIS — Z8739 Personal history of other diseases of the musculoskeletal system and connective tissue: Secondary | ICD-10-CM

## 2016-06-15 DIAGNOSIS — Z9889 Other specified postprocedural states: Secondary | ICD-10-CM

## 2016-06-15 DIAGNOSIS — Z7982 Long term (current) use of aspirin: Secondary | ICD-10-CM | POA: Diagnosis not present

## 2016-06-15 DIAGNOSIS — R278 Other lack of coordination: Secondary | ICD-10-CM | POA: Diagnosis not present

## 2016-06-15 DIAGNOSIS — D638 Anemia in other chronic diseases classified elsewhere: Secondary | ICD-10-CM | POA: Diagnosis not present

## 2016-06-15 DIAGNOSIS — R2689 Other abnormalities of gait and mobility: Secondary | ICD-10-CM | POA: Diagnosis not present

## 2016-06-15 DIAGNOSIS — M2550 Pain in unspecified joint: Secondary | ICD-10-CM | POA: Diagnosis not present

## 2016-06-15 DIAGNOSIS — M1711 Unilateral primary osteoarthritis, right knee: Secondary | ICD-10-CM | POA: Diagnosis not present

## 2016-06-15 DIAGNOSIS — E785 Hyperlipidemia, unspecified: Secondary | ICD-10-CM | POA: Diagnosis present

## 2016-06-15 DIAGNOSIS — M25569 Pain in unspecified knee: Secondary | ICD-10-CM | POA: Diagnosis not present

## 2016-06-15 DIAGNOSIS — Z79899 Other long term (current) drug therapy: Secondary | ICD-10-CM | POA: Diagnosis not present

## 2016-06-15 DIAGNOSIS — M1990 Unspecified osteoarthritis, unspecified site: Secondary | ICD-10-CM | POA: Diagnosis not present

## 2016-06-15 DIAGNOSIS — I10 Essential (primary) hypertension: Secondary | ICD-10-CM | POA: Diagnosis not present

## 2016-06-15 DIAGNOSIS — K5900 Constipation, unspecified: Secondary | ICD-10-CM | POA: Diagnosis not present

## 2016-06-15 DIAGNOSIS — K219 Gastro-esophageal reflux disease without esophagitis: Secondary | ICD-10-CM | POA: Diagnosis not present

## 2016-06-15 DIAGNOSIS — Z96641 Presence of right artificial hip joint: Secondary | ICD-10-CM | POA: Diagnosis not present

## 2016-06-15 DIAGNOSIS — Z471 Aftercare following joint replacement surgery: Secondary | ICD-10-CM | POA: Diagnosis not present

## 2016-06-15 DIAGNOSIS — N400 Enlarged prostate without lower urinary tract symptoms: Secondary | ICD-10-CM | POA: Diagnosis not present

## 2016-06-15 DIAGNOSIS — Z96651 Presence of right artificial knee joint: Secondary | ICD-10-CM | POA: Diagnosis not present

## 2016-06-15 DIAGNOSIS — G8918 Other acute postprocedural pain: Secondary | ICD-10-CM | POA: Diagnosis not present

## 2016-06-15 HISTORY — PX: TOTAL KNEE ARTHROPLASTY: SHX125

## 2016-06-15 SURGERY — ARTHROPLASTY, KNEE, TOTAL
Anesthesia: Monitor Anesthesia Care | Site: Knee | Laterality: Right

## 2016-06-15 MED ORDER — ONDANSETRON HCL 4 MG PO TABS: 4.0000 mg | ORAL_TABLET | Freq: Four times a day (QID) | ORAL | Status: DC | PRN

## 2016-06-15 MED ORDER — ONDANSETRON HCL 4 MG/2ML IJ SOLN
INTRAMUSCULAR | Status: DC | PRN
  Administered 2016-06-15: 4 mg via INTRAVENOUS

## 2016-06-15 MED ORDER — ONDANSETRON HCL 4 MG/2ML IJ SOLN: 4.0000 mg | Freq: Four times a day (QID) | INTRAMUSCULAR | Status: DC | PRN

## 2016-06-15 MED ORDER — SENNA 8.6 MG PO TABS
1.0000 | ORAL_TABLET | Freq: Two times a day (BID) | ORAL | Status: DC
  Administered 2016-06-16 – 2016-06-17 (×3): 8.6 mg via ORAL
  Filled 2016-06-15 (×4): qty 1

## 2016-06-15 MED ORDER — ACETAMINOPHEN 500 MG PO TABS
1000.0000 mg | ORAL_TABLET | Freq: Once | ORAL | Status: AC
  Administered 2016-06-15: 1000 mg via ORAL
  Filled 2016-06-15: qty 2

## 2016-06-15 MED ORDER — LACTATED RINGERS IV SOLN
INTRAVENOUS | Status: DC
  Administered 2016-06-15: 11:00:00 via INTRAVENOUS

## 2016-06-15 MED ORDER — TAMSULOSIN HCL 0.4 MG PO CAPS
0.8000 mg | ORAL_CAPSULE | Freq: Every day | ORAL | Status: DC
  Administered 2016-06-15 – 2016-06-17 (×3): 0.8 mg via ORAL
  Filled 2016-06-15 (×3): qty 2

## 2016-06-15 MED ORDER — PROPOFOL 10 MG/ML IV BOLUS
INTRAVENOUS | Status: AC
  Filled 2016-06-15: qty 20

## 2016-06-15 MED ORDER — KETOROLAC TROMETHAMINE 30 MG/ML IJ SOLN
INTRAMUSCULAR | Status: DC | PRN
  Administered 2016-06-15: 30 mg via INTRA_ARTICULAR

## 2016-06-15 MED ORDER — DOCUSATE SODIUM 100 MG PO CAPS: 100.0000 mg | ORAL_CAPSULE | Freq: Two times a day (BID) | ORAL | 0 refills | Status: DC

## 2016-06-15 MED ORDER — GABAPENTIN 300 MG PO CAPS
300.0000 mg | ORAL_CAPSULE | Freq: Once | ORAL | Status: AC
  Administered 2016-06-15: 300 mg via ORAL
  Filled 2016-06-15: qty 1

## 2016-06-15 MED ORDER — CEFAZOLIN SODIUM-DEXTROSE 2-4 GM/100ML-% IV SOLN
2.0000 g | INTRAVENOUS | Status: AC
  Administered 2016-06-15: 2 g via INTRAVENOUS
  Filled 2016-06-15: qty 100

## 2016-06-15 MED ORDER — SODIUM CHLORIDE FLUSH 0.9 % IV SOLN
INTRAVENOUS | Status: DC | PRN
  Administered 2016-06-15: 30 mL via INTRAVENOUS

## 2016-06-15 MED ORDER — LOSARTAN POTASSIUM 50 MG PO TABS
100.0000 mg | ORAL_TABLET | Freq: Every day | ORAL | Status: DC
  Administered 2016-06-16 – 2016-06-17 (×2): 100 mg via ORAL
  Filled 2016-06-15 (×2): qty 2

## 2016-06-15 MED ORDER — ACETAMINOPHEN 325 MG PO TABS: 650.0000 mg | ORAL_TABLET | Freq: Four times a day (QID) | ORAL | Status: DC | PRN

## 2016-06-15 MED ORDER — BUPIVACAINE HCL (PF) 0.5 % IJ SOLN
INTRAMUSCULAR | Status: DC | PRN
  Administered 2016-06-15: 30 mL

## 2016-06-15 MED ORDER — KETOROLAC TROMETHAMINE 30 MG/ML IJ SOLN
INTRAMUSCULAR | Status: AC
  Filled 2016-06-15: qty 1

## 2016-06-15 MED ORDER — EPINEPHRINE PF 1 MG/ML IJ SOLN
INTRAMUSCULAR | Status: DC | PRN
  Administered 2016-06-15: .15 mL

## 2016-06-15 MED ORDER — PANTOPRAZOLE SODIUM 40 MG PO TBEC
40.0000 mg | DELAYED_RELEASE_TABLET | Freq: Every day | ORAL | Status: DC
  Administered 2016-06-15 – 2016-06-17 (×3): 40 mg via ORAL
  Filled 2016-06-15 (×3): qty 1

## 2016-06-15 MED ORDER — MIDAZOLAM HCL 2 MG/2ML IJ SOLN
2.0000 mg | Freq: Once | INTRAMUSCULAR | Status: AC
  Administered 2016-06-15: 1 mg via INTRAVENOUS
  Filled 2016-06-15: qty 2

## 2016-06-15 MED ORDER — MIDAZOLAM HCL 5 MG/5ML IJ SOLN
INTRAMUSCULAR | Status: DC | PRN
  Administered 2016-06-15: 0.5 mg via INTRAVENOUS

## 2016-06-15 MED ORDER — MENTHOL 3 MG MT LOZG: 1.0000 | LOZENGE | OROMUCOSAL | Status: DC | PRN

## 2016-06-15 MED ORDER — CHLORHEXIDINE GLUCONATE 4 % EX LIQD: 60.0000 mL | Freq: Once | CUTANEOUS | Status: DC

## 2016-06-15 MED ORDER — RIVAROXABAN 10 MG PO TABS: 10.0000 mg | ORAL_TABLET | Freq: Every day | ORAL | 0 refills | Status: DC

## 2016-06-15 MED ORDER — LACTATED RINGERS IV SOLN
INTRAVENOUS | Status: DC
  Administered 2016-06-15 – 2016-06-16 (×4): via INTRAVENOUS

## 2016-06-15 MED ORDER — PHENOL 1.4 % MT LIQD: 1.0000 | OROMUCOSAL | Status: DC | PRN

## 2016-06-15 MED ORDER — ACETAMINOPHEN 650 MG RE SUPP: 650.0000 mg | Freq: Four times a day (QID) | RECTAL | Status: DC | PRN

## 2016-06-15 MED ORDER — RIVAROXABAN 10 MG PO TABS
10.0000 mg | ORAL_TABLET | Freq: Every day | ORAL | Status: DC
  Administered 2016-06-16 – 2016-06-17 (×2): 10 mg via ORAL
  Filled 2016-06-15 (×2): qty 1

## 2016-06-15 MED ORDER — DEXAMETHASONE SODIUM PHOSPHATE 10 MG/ML IJ SOLN
10.0000 mg | Freq: Once | INTRAMUSCULAR | Status: AC
  Administered 2016-06-16: 10 mg via INTRAVENOUS
  Filled 2016-06-15: qty 1

## 2016-06-15 MED ORDER — MEPERIDINE HCL 25 MG/ML IJ SOLN: 6.2500 mg | INTRAMUSCULAR | Status: DC | PRN

## 2016-06-15 MED ORDER — FENTANYL CITRATE (PF) 100 MCG/2ML IJ SOLN
100.0000 ug | Freq: Once | INTRAMUSCULAR | Status: AC
  Administered 2016-06-15: 50 ug via INTRAVENOUS
  Filled 2016-06-15: qty 2

## 2016-06-15 MED ORDER — HYDROCODONE-ACETAMINOPHEN 5-325 MG PO TABS: 1.0000 | ORAL_TABLET | ORAL | 0 refills | Status: DC | PRN

## 2016-06-15 MED ORDER — PROPOFOL 500 MG/50ML IV EMUL
INTRAVENOUS | Status: DC | PRN
  Administered 2016-06-15: 75 ug/kg/min via INTRAVENOUS

## 2016-06-15 MED ORDER — LIDOCAINE 2% (20 MG/ML) 5 ML SYRINGE
INTRAMUSCULAR | Status: AC
  Filled 2016-06-15: qty 5

## 2016-06-15 MED ORDER — PHENYLEPHRINE HCL 10 MG/ML IJ SOLN
INTRAMUSCULAR | Status: DC | PRN
  Administered 2016-06-15: 20 ug/min via INTRAVENOUS

## 2016-06-15 MED ORDER — 0.9 % SODIUM CHLORIDE (POUR BTL) OPTIME
TOPICAL | Status: DC | PRN
  Administered 2016-06-15: 1000 mL

## 2016-06-15 MED ORDER — HYDROCODONE-ACETAMINOPHEN 5-325 MG PO TABS
1.0000 | ORAL_TABLET | ORAL | Status: DC | PRN
  Administered 2016-06-15: 1 via ORAL
  Filled 2016-06-15 (×2): qty 1

## 2016-06-15 MED ORDER — METOCLOPRAMIDE HCL 5 MG/ML IJ SOLN: 5.0000 mg | Freq: Three times a day (TID) | INTRAMUSCULAR | Status: DC | PRN

## 2016-06-15 MED ORDER — ONDANSETRON HCL 4 MG/2ML IJ SOLN: 4.0000 mg | Freq: Once | INTRAMUSCULAR | Status: DC | PRN

## 2016-06-15 MED ORDER — METHOCARBAMOL 500 MG PO TABS
500.0000 mg | ORAL_TABLET | Freq: Four times a day (QID) | ORAL | Status: DC | PRN
  Administered 2016-06-15: 500 mg via ORAL
  Filled 2016-06-15 (×2): qty 1

## 2016-06-15 MED ORDER — ONDANSETRON HCL 4 MG PO TABS: 4.0000 mg | ORAL_TABLET | Freq: Three times a day (TID) | ORAL | 0 refills | Status: AC | PRN

## 2016-06-15 MED ORDER — HYDROMORPHONE HCL 1 MG/ML IJ SOLN: 0.2500 mg | INTRAMUSCULAR | Status: DC | PRN

## 2016-06-15 MED ORDER — KETOROLAC TROMETHAMINE 15 MG/ML IJ SOLN
7.5000 mg | Freq: Four times a day (QID) | INTRAMUSCULAR | Status: AC
  Administered 2016-06-15 – 2016-06-16 (×3): 7.5 mg via INTRAVENOUS
  Filled 2016-06-15 (×3): qty 1

## 2016-06-15 MED ORDER — METHOCARBAMOL 1000 MG/10ML IJ SOLN
500.0000 mg | Freq: Four times a day (QID) | INTRAMUSCULAR | Status: DC | PRN
  Filled 2016-06-15: qty 5

## 2016-06-15 MED ORDER — DOCUSATE SODIUM 100 MG PO CAPS
100.0000 mg | ORAL_CAPSULE | Freq: Two times a day (BID) | ORAL | Status: DC
  Administered 2016-06-16 – 2016-06-17 (×3): 100 mg via ORAL
  Filled 2016-06-15 (×4): qty 1

## 2016-06-15 MED ORDER — DIPHENHYDRAMINE HCL 12.5 MG/5ML PO ELIX: 12.5000 mg | ORAL_SOLUTION | ORAL | Status: DC | PRN

## 2016-06-15 MED ORDER — BUPIVACAINE-EPINEPHRINE (PF) 0.5% -1:200000 IJ SOLN
INTRAMUSCULAR | Status: DC | PRN
  Administered 2016-06-15: 30 mL via PERINEURAL

## 2016-06-15 MED ORDER — MIDAZOLAM HCL 2 MG/2ML IJ SOLN
INTRAMUSCULAR | Status: AC
  Filled 2016-06-15: qty 2

## 2016-06-15 MED ORDER — LIDOCAINE HCL (CARDIAC) 20 MG/ML IV SOLN
INTRAVENOUS | Status: DC | PRN
  Administered 2016-06-15: 40 mg via INTRAVENOUS

## 2016-06-15 MED ORDER — SORBITOL 70 % SOLN: 30.0000 mL | Freq: Every day | Status: DC | PRN

## 2016-06-15 MED ORDER — ONDANSETRON HCL 4 MG/2ML IJ SOLN
INTRAMUSCULAR | Status: AC
  Filled 2016-06-15: qty 2

## 2016-06-15 MED ORDER — FENTANYL CITRATE (PF) 100 MCG/2ML IJ SOLN
INTRAMUSCULAR | Status: AC
  Filled 2016-06-15: qty 2

## 2016-06-15 MED ORDER — METOCLOPRAMIDE HCL 5 MG PO TABS: 5.0000 mg | ORAL_TABLET | Freq: Three times a day (TID) | ORAL | Status: DC | PRN

## 2016-06-15 MED ORDER — FENTANYL CITRATE (PF) 100 MCG/2ML IJ SOLN
INTRAMUSCULAR | Status: DC | PRN
  Administered 2016-06-15: 25 ug via INTRAVENOUS

## 2016-06-15 MED ORDER — POLYETHYLENE GLYCOL 3350 17 G PO PACK: 17.0000 g | PACK | Freq: Every day | ORAL | Status: DC | PRN

## 2016-06-15 MED ORDER — CEFAZOLIN SODIUM-DEXTROSE 2-4 GM/100ML-% IV SOLN
2.0000 g | Freq: Four times a day (QID) | INTRAVENOUS | Status: AC
  Administered 2016-06-15: 2 g via INTRAVENOUS
  Filled 2016-06-15 (×2): qty 100

## 2016-06-15 MED ORDER — BACLOFEN 10 MG PO TABS: 10.0000 mg | ORAL_TABLET | Freq: Three times a day (TID) | ORAL | 0 refills | Status: DC | PRN

## 2016-06-15 MED ORDER — SODIUM CHLORIDE 0.9 % IR SOLN
Status: DC | PRN
  Administered 2016-06-15: 3000 mL

## 2016-06-15 MED ORDER — MORPHINE SULFATE (PF) 2 MG/ML IV SOLN: 1.0000 mg | INTRAVENOUS | Status: DC | PRN

## 2016-06-15 SURGICAL SUPPLY — 68 items
BANDAGE ESMARK 6X9 LF (GAUZE/BANDAGES/DRESSINGS) ×1 IMPLANT
BLADE SAG 18X100X1.27 (BLADE) ×3 IMPLANT
BNDG COHESIVE 6X5 TAN STRL LF (GAUZE/BANDAGES/DRESSINGS) ×3 IMPLANT
BNDG ELASTIC 6X10 VLCR STRL LF (GAUZE/BANDAGES/DRESSINGS) ×3 IMPLANT
BNDG ESMARK 6X9 LF (GAUZE/BANDAGES/DRESSINGS) ×3
BONE CEMENT PALACOSE (Orthopedic Implant) ×3 IMPLANT
BOWL SMART MIX CTS (DISPOSABLE) ×3 IMPLANT
CAP KNEE PARTIAL 2 ×1 IMPLANT
CAPT KNEE PARTIAL 2 ×2 IMPLANT
CEMENT BONE PALACOSE (Orthopedic Implant) ×1 IMPLANT
CLOSURE STERI-STRIP 1/2X4 (GAUZE/BANDAGES/DRESSINGS) ×2
CLSR STERI-STRIP ANTIMIC 1/2X4 (GAUZE/BANDAGES/DRESSINGS) ×4 IMPLANT
COVER SURGICAL LIGHT HANDLE (MISCELLANEOUS) ×6 IMPLANT
CUFF TOURNIQUET SINGLE 34IN LL (TOURNIQUET CUFF) ×3 IMPLANT
DERMABOND ADVANCED (GAUZE/BANDAGES/DRESSINGS)
DERMABOND ADVANCED .7 DNX12 (GAUZE/BANDAGES/DRESSINGS) IMPLANT
DRAPE HALF SHEET 40X57 (DRAPES) ×3 IMPLANT
DRAPE U-SHAPE 47X51 STRL (DRAPES) ×3 IMPLANT
DRSG ADAPTIC 3X8 NADH LF (GAUZE/BANDAGES/DRESSINGS) ×3 IMPLANT
DRSG MEPILEX BORDER 4X8 (GAUZE/BANDAGES/DRESSINGS) ×3 IMPLANT
DURAPREP 26ML APPLICATOR (WOUND CARE) ×6 IMPLANT
ELECT CAUTERY BLADE 6.4 (BLADE) ×3 IMPLANT
ELECT REM PT RETURN 9FT ADLT (ELECTROSURGICAL) ×3
ELECTRODE REM PT RTRN 9FT ADLT (ELECTROSURGICAL) ×1 IMPLANT
FACESHIELD WRAPAROUND (MASK) IMPLANT
GAUZE SPONGE 4X4 12PLY STRL (GAUZE/BANDAGES/DRESSINGS) ×3 IMPLANT
GLOVE BIO SURGEON STRL SZ7.5 (GLOVE) ×6 IMPLANT
GLOVE BIOGEL PI IND STRL 7.0 (GLOVE) ×1 IMPLANT
GLOVE BIOGEL PI IND STRL 8 (GLOVE) ×2 IMPLANT
GLOVE BIOGEL PI INDICATOR 7.0 (GLOVE) ×2
GLOVE BIOGEL PI INDICATOR 8 (GLOVE) ×4
GLOVE INDICATOR 7.0 STRL GRN (GLOVE) ×3 IMPLANT
GLOVE SURG SS PI 6.5 STRL IVOR (GLOVE) ×6 IMPLANT
GOWN STRL REUS W/ TWL LRG LVL3 (GOWN DISPOSABLE) ×1 IMPLANT
GOWN STRL REUS W/ TWL XL LVL3 (GOWN DISPOSABLE) ×3 IMPLANT
GOWN STRL REUS W/TWL LRG LVL3 (GOWN DISPOSABLE) ×2
GOWN STRL REUS W/TWL XL LVL3 (GOWN DISPOSABLE) ×6
HANDPIECE INTERPULSE COAX TIP (DISPOSABLE)
IMMOBILIZER KNEE 22 UNIV (SOFTGOODS) ×3 IMPLANT
IMMOBILIZER KNEE 24 THIGH 36 (MISCELLANEOUS) IMPLANT
IMMOBILIZER KNEE 24 UNIV (MISCELLANEOUS)
KIT BASIN OR (CUSTOM PROCEDURE TRAY) ×3 IMPLANT
KIT ROOM TURNOVER OR (KITS) ×3 IMPLANT
MANIFOLD NEPTUNE II (INSTRUMENTS) ×3 IMPLANT
NEEDLE 18GX1X1/2 (RX/OR ONLY) (NEEDLE) ×3 IMPLANT
NS IRRIG 1000ML POUR BTL (IV SOLUTION) ×3 IMPLANT
PACK BLADE SAW RECIP 70 3 PT (BLADE) ×3 IMPLANT
PACK TOTAL JOINT (CUSTOM PROCEDURE TRAY) ×3 IMPLANT
PACK UNIVERSAL I (CUSTOM PROCEDURE TRAY) ×3 IMPLANT
PAD ARMBOARD 7.5X6 YLW CONV (MISCELLANEOUS) ×3 IMPLANT
SET HNDPC FAN SPRY TIP SCT (DISPOSABLE) IMPLANT
STAPLER VISISTAT 35W (STAPLE) IMPLANT
SUCTION FRAZIER HANDLE 10FR (MISCELLANEOUS) ×2
SUCTION TUBE FRAZIER 10FR DISP (MISCELLANEOUS) ×1 IMPLANT
SUT MNCRL AB 4-0 PS2 18 (SUTURE) ×3 IMPLANT
SUT MON AB 2-0 CT1 27 (SUTURE) ×6 IMPLANT
SUT MON AB 2-0 CT1 36 (SUTURE) ×3 IMPLANT
SUT VIC AB 0 CT1 27 (SUTURE) ×2
SUT VIC AB 0 CT1 27XBRD ANBCTR (SUTURE) ×1 IMPLANT
SUT VIC AB 1 CT1 27 (SUTURE) ×4
SUT VIC AB 1 CT1 27XBRD ANBCTR (SUTURE) ×2 IMPLANT
SUT VIC AB 1 CTX 36 (SUTURE) ×2
SUT VIC AB 1 CTX36XBRD ANBCTR (SUTURE) ×1 IMPLANT
SYR 50ML LL SCALE MARK (SYRINGE) ×3 IMPLANT
TOWEL OR 17X24 6PK STRL BLUE (TOWEL DISPOSABLE) ×3 IMPLANT
TOWEL OR 17X26 10 PK STRL BLUE (TOWEL DISPOSABLE) ×3 IMPLANT
TRAY CATH 16FR W/PLASTIC CATH (SET/KITS/TRAYS/PACK) IMPLANT
TRAY FOLEY BAG SILVER LF 14FR (CATHETERS) IMPLANT

## 2016-06-15 NOTE — Transfer of Care (Signed)
Immediate Anesthesia Transfer of Care Note  Patient: William Skinner  Procedure(s) Performed: Procedure(s): RIGHT TOTAL KNEE ARTHROPLASTY (Right)  Patient Location: PACU  Anesthesia Type:MAC and Spinal  Level of Consciousness: awake and patient cooperative  Airway & Oxygen Therapy: Patient Spontanous Breathing and Patient connected to face mask oxygen  Post-op Assessment: Report given to RN and Post -op Vital signs reviewed and stable  Post vital signs: Reviewed  Last Vitals:  Vitals:   06/15/16 1149 06/15/16 1548  BP:    Pulse: 72   Resp: 16   Temp:  (P) 36.4 C    Last Pain:  Vitals:   06/15/16 1548  TempSrc:   PainSc: (P) Asleep         Complications: No apparent anesthesia complications

## 2016-06-15 NOTE — Anesthesia Procedure Notes (Signed)
Procedure Name: MAC Date/Time: 06/15/2016 1:50 PM Performed by: Jenne Campus Pre-anesthesia Checklist: Patient identified, Emergency Drugs available, Suction available, Patient being monitored and Timeout performed Oxygen Delivery Method: Simple face mask

## 2016-06-15 NOTE — Anesthesia Procedure Notes (Addendum)
Anesthesia Regional Block:  Adductor canal block  Pre-Anesthetic Checklist: ,, timeout performed, Correct Patient, Correct Site, Correct Laterality, Correct Procedure, Correct Position, site marked, Risks and benefits discussed,  Surgical consent,  Pre-op evaluation,  At surgeon's request and post-op pain management  Laterality: Right  Prep: chloraprep       Needles:  Injection technique: Single-shot  Needle Type: Echogenic Stimulator Needle     Needle Length: 9cm 9 cm Needle Gauge: 21 and 21 G    Additional Needles:  Procedures: ultrasound guided (picture in chart) Adductor canal block Narrative:  Start time: 06/15/2016 11:15 AM End time: 06/15/2016 11:30 AM Injection made incrementally with aspirations every 5 mL.  Performed by: Personally  Anesthesiologist: Lillia Abed  Additional Notes: Monitors applied. Patient sedated. Sterile prep and drape,hand hygiene and sterile gloves were used. Relevant anatomy identified.Needle position confirmed.Local anesthetic injected incrementally after negative aspiration. Local anesthetic spread visualized around nerve(s). Vascular puncture avoided. No complications. Image printed for medical record.The patient tolerated the procedure well.    Lillia Abed MD

## 2016-06-15 NOTE — Interval H&P Note (Signed)
History and Physical Interval Note:  06/15/2016 2:40 PM  William Skinner  has presented today for surgery, with the diagnosis of OSTEOARTHRITIS RIGHT KNEE  The various methods of treatment have been discussed with the patient and family. After consideration of risks, benefits and other options for treatment, the patient has consented to  Procedure(s): RIGHT TOTAL KNEE ARTHROPLASTY (Right) as a surgical intervention .  The patient's history has been reviewed, patient examined, no change in status, stable for surgery.  I have reviewed the patient's chart and labs.  Questions were answered to the patient's satisfaction.     Lachandra Dettmann D

## 2016-06-15 NOTE — Op Note (Addendum)
06/15/2016  2:40 PM  PATIENT:  William Skinner    PRE-OPERATIVE DIAGNOSIS:  OSTEOARTHRITIS RIGHT KNEE  POST-OPERATIVE DIAGNOSIS:  Same  PROCEDURE:  RIGHT UNICOMPARTMENTAL KNEE ARTHROPLASTY  SURGEON:  Markian Glockner, Ernesta Amble, MD  PHYSICIAN ASSISTANT: Roxan Hockey, PA-C, he was present and scrubbed throughout the case, critical for completion in a timely fashion, and for retraction, instrumentation, and closure.   ANESTHESIA:   General  PREOPERATIVE INDICATIONS:  William Skinner is a  82 y.o. male with a diagnosis of OSTEOARTHRITIS RIGHT KNEE who failed conservative measures and elected for surgical management.    The risks benefits and alternatives were discussed with the patient preoperatively including but not limited to the risks of infection, bleeding, nerve injury, cardiopulmonary complications, blood clots, the need for revision surgery, among others, and the patient was willing to proceed.  OPERATIVE IMPLANTS: Biomet Oxford mobile bearing medial compartment arthroplasty. Femoral Component: medium. Tibial tray: E, Size 4 poly.   OPERATIVE FINDINGS: Endstage grade 4 medial compartment osteoarthritis. No significant changes in the lateral or patellofemoral joint  OPERATIVE PROCEDURE: The patient was brought to the operating room placed in supine position. General anesthesia was administered. IV antibiotics were given. The lower extremity was placed in the legholder and prepped and draped in usual sterile fashion.  Time out was performed.  The leg was elevated and exsanguinated and the tourniquet was inflated. Anteromedial incision was performed, and I took care to preserve the MCL. Parapatellar incision was carried out, and the osteophytes were excised, along with the medial meniscus and a small portion of the fat pad.  The extra medullary tibial cutting jig was applied, using the spoon and the 49mm G-Clamp, and I took care to protect the anterior cruciate ligament insertion and the  tibial spine. The medial collateral ligament was also protected, and I resected my proximal tibia, matching the anatomic slope.   The proximal tibial bony cut was removed in one piece, and I turned my attention to the femur.  The intramedullary femoral rod was placed using the drill, and then using the appropriate reference, I assembled the femoral jig, setting my posterior cutting block. I resected my posterior femur, and then measured my gap.   I then used the mill to match the extension gap to the flexion gap. The gaps were then measured again with the appropriate feeler gauges. Once I had balanced flexion and extension gaps, I then completed the preparation of the femur.  I milled off the anterior aspect of the distal femur to prevent impingement. I also exposed the tibia, and selected the above-named component, and then used the cutting jig to prepare the keel slot on the tibia. I also used the awl to curette out the bone to complete the preparation of the keel. The back wall was intact.  I then placed trial components, and it was found to have excellent motion, and appropriate balance.  I then cemented the components into place, cementing the tibia first, removing all excess cement, and then cementing the femur.  All loose cement was removed.  The real polyethylene insert was applied manually, and the knee was taken through functional range of motion, and found to have excellent stability and restoration of joint motion, with excellent balance.  The wounds were irrigated copiously, and the parapatellar tissue closed with Vicryl, followed by Vicryl for the subcutaneous tissue, with routine closure with Steri-Strips and sterile gauze.  The tourniquet was released, and the patient was awakened and extubated and returned to  PACU in stable and satisfactory condition. There were no complications.  POSTOPERATIVE PLAN: DVT px will consist of SCD's and Chemical px, WBAT     William Butters,  MD

## 2016-06-15 NOTE — Progress Notes (Signed)
Orthopedic Tech Progress Note Patient Details:  William Skinner Jul 06, 1926 [id number]  CPM Right Knee CPM Right Knee: On Right Knee Flexion (Degrees): 90 Right Knee Extension (Degrees): 0  Ortho Devices Ortho Device/Splint Location: footsie roll and ohf  Ortho Device/Splint Interventions: Ordered, Application, Adjustment   Braulio Bosch 06/15/2016, 4:58 PM

## 2016-06-15 NOTE — Anesthesia Preprocedure Evaluation (Addendum)
Anesthesia Evaluation  Patient identified by MRN, date of birth, ID band Patient awake    Reviewed: Allergy & Precautions, NPO status , Patient's Chart, lab work & pertinent test results  Airway Mallampati: II  TM Distance: >3 FB Neck ROM: Full    Dental  (+) Poor Dentition, Dental Advisory Given   Pulmonary former smoker,    Pulmonary exam normal  + decreased breath sounds      Cardiovascular hypertension, Pt. on medications Normal cardiovascular exam Rhythm:Regular Rate:Normal  S/P CEA   Neuro/Psych    GI/Hepatic   Endo/Other    Renal/GU      Musculoskeletal   Abdominal   Peds  Hematology   Anesthesia Other Findings   Reproductive/Obstetrics                            Anesthesia Physical Anesthesia Plan  ASA: III  Anesthesia Plan: MAC and Spinal   Post-op Pain Management:  Regional for Post-op pain   Induction: Intravenous  Airway Management Planned: Simple Face Mask  Additional Equipment:   Intra-op Plan:   Post-operative Plan:   Informed Consent: I have reviewed the patients History and Physical, chart, labs and discussed the procedure including the risks, benefits and alternatives for the proposed anesthesia with the patient or authorized representative who has indicated his/her understanding and acceptance.     Plan Discussed with: CRNA and Surgeon  Anesthesia Plan Comments:         Anesthesia Quick Evaluation

## 2016-06-16 ENCOUNTER — Encounter (HOSPITAL_COMMUNITY): Payer: Self-pay | Admitting: Orthopedic Surgery

## 2016-06-16 NOTE — Evaluation (Signed)
Physical Therapy Evaluation Patient Details Name: William Skinner MRN: [id number] DOB: [birth year] Today's Date: 06/16/2016   History of Present Illness  s/p R UKR; PMHx: back pain, HTN, L knee surgery  Clinical Impression  Pt admitted with above diagnosis. Pt currently with functional limitations due to the deficits listed below (see PT Problem List).  Pt will benefit from skilled PT to increase their independence and safety with mobility to allow discharge to the venue listed below.   Pt limited by pain and fatigue this session, requiring mod/max assist for basic bed to chair/stand pivot transfers at this time; recommend SNF post acute; will continue to follow     Follow Up Recommendations SNF;Supervision/Assistance - 24 hour    Equipment Recommendations  Rolling walker with 5" wheels;Other (comment) (or defer to next venue)    Recommendations for Other Services       Precautions / Restrictions Precautions Precautions: Fall Restrictions Weight Bearing Restrictions: No Other Position/Activity Restrictions: WBAT      Mobility  Bed Mobility Overal bed mobility: Needs Assistance Bed Mobility: Supine to Sit     Supine to sit: Mod assist     General bed mobility comments: bed pad used to assist scooting, requires assist with RLE off bed and to elevate trunk, incr time  Transfers Overall transfer level: Needs assistance Equipment used: Rolling walker (2 wheeled) Transfers: Sit to/from [facility] Sit to Stand: Mod assist;Max assist Stand pivot transfers: Mod assist;Max assist       General transfer comment: 2 attempts to stand, assist with anterior-superior translation and for balance while transitioning to RW; assist to prevent posterior LOB and to maneuver RW during stand pivot; step by step cues throughout to incr pt effort and for safe technique; fatigues rapidly  Ambulation/Gait             General Gait Details: unable  Stairs             Wheelchair Mobility    Modified Rankin (Stroke Patients Only)       Balance Overall balance assessment: Needs assistance   Sitting balance-Leahy Scale: Fair       Standing balance-Leahy Scale: Poor                               Pertinent Vitals/Pain Pain Assessment: 0-10 Pain Score: 4  Pain Location: right knee Pain Descriptors / Indicators: Aching;Sore Pain Intervention(s): Monitored during session;Limited activity within patient's tolerance;Repositioned    Home Living Family/patient expects to be discharged to:: Skilled nursing facility                 Additional Comments: pt states he is  planning for rehab at [facility] states back to ALF with HHPT (?)    Prior Function Level of Independence: Independent with assistive device(s);Independent         Comments: amb with rollator prior to surgery     Hand Dominance        Extremity/Trunk Assessment   Upper Extremity Assessment: Defer to OT evaluation           Lower Extremity Assessment: Generalized weakness RLE Deficits / Details: hip flexion and knee extension 2+/5; AAROM knee flexion ~ -12 to 55*; ankle WFL; anticipated  post op pain and weakness    Cervical / Trunk Assessment: Kyphotic  Communication   Communication: No difficulties  Cognition Arousal/Alertness: Awake/alert Behavior During Therapy: WFL for tasks assessed/performed Overall Cognitive Status:  Within Functional Limits for tasks assessed                      General Comments      Exercises Total Joint Exercises Ankle Circles/Pumps: AROM;Both;10 reps Quad Sets: AROM;Both;10 reps Heel Slides: AAROM;Right;5 reps Straight Leg Raises: 10 reps;AROM;AAROM;Strengthening;Right   Assessment/Plan    PT Assessment Patient needs continued PT services  PT Problem List Decreased strength;Decreased range of motion;Decreased activity tolerance;Decreased balance;Decreased mobility;Decreased knowledge of use  of DME;Decreased knowledge of precautions;Pain          PT Treatment Interventions DME instruction;Gait training;Functional mobility training;Therapeutic exercise;Therapeutic activities;Patient/family education    PT Goals (Current goals can be found in the Care Plan section)  Acute Rehab PT Goals Patient Stated Goal: get stronger PT Goal Formulation: With patient/family Time For Goal Achievement: 06/23/16 Potential to Achieve Goals: Good    Frequency 7X/week   Barriers to discharge        Co-evaluation               End of Session Equipment Utilized During Treatment: Gait belt Activity Tolerance: Patient tolerated treatment well Patient left: with call bell/phone within reach;in chair;with family/visitor present           Time: 0923-0950 PT Time Calculation (min) (ACUTE ONLY): 27 min   Charges:   PT Evaluation $PT Eval Low Complexity: 1 Procedure PT Treatments $Therapeutic Activity: 8-22 mins   PT G Codes:        William Skinner 07-02-2016, 11:45 AM

## 2016-06-16 NOTE — Progress Notes (Signed)
Spoke to [employer], [place], about patient's O2 status. Patient's continuous pulse ox continues to beep. He stated OK to d/c continuous pulse ox as patient is not in distress. Patient currently on 2 L per nasal cannula and O2 sat remain between 90-92%. He stated okay to wean O2 as long as O2 sat remains above 92%

## 2016-06-16 NOTE — Progress Notes (Signed)
Orthopedic Tech Progress Note Patient Details:  William Skinner [birth year] [id number]  Patient ID: William Skinner, male   DOB: 1925-08-08, 82 y.o.   MRN: [id number] Applied cpm 0-60  Karolee Stamps 06/16/2016, 6:27 AM

## 2016-06-16 NOTE — Progress Notes (Signed)
   Assessment: 1 Day Post-Op  S/P Procedure(s) (LRB): RIGHT TOTAL KNEE ARTHROPLASTY (Right) by Dr. Ernesta Amble. Murphy on 06/15/16  Principal Problem:   Primary osteoarthritis of right knee Active Problems:   Essential hypertension   H/O spinal stenosis   H/O carotid endarterectomy  Plan: Up with therapy  Weight Bearing: Weight Bearing as Tolerated (WBAT) right leg Dressings: PRN.  VTE prophylaxis: Xarelto, SCDs, ambulation Dispo: Pending.  Likely home to Assisted living with additional home health support, PT, OT, Home health aid, bath aid etc.  Subjective: Patient reports pain as mild. Pain controlled with PO meds.  Tolerating diet.  Urinating.    No CP, SOB.    Objective:   VITALS:   Vitals:   06/15/16 1900 06/15/16 2010 06/16/16 0000 06/16/16 0400  BP:  128/64 (!) 119/56 135/63  Pulse:  66 65 73  Resp:  18 17 16   Temp:  97.5 F (36.4 C) 97.6 F (36.4 C) 97.7 F (36.5 C)  TempSrc:  Oral Oral   SpO2: 96% 98% 97% 100%  Weight:      Height:       CBC Latest Ref Rng & Units 06/03/2016  WBC 4.0 - 10.5 K/uL 5.5  Hemoglobin 13.0 - 17.0 g/dL 12.7(L)  Hematocrit 39.0 - 52.0 % 38.8(L)  Platelets 150 - 400 K/uL 168   BMP Latest Ref Rng & Units 06/03/2016  Glucose 65 - 99 mg/dL 102(H)  BUN 6 - 20 mg/dL 27(H)  Creatinine 0.61 - 1.24 mg/dL 1.05  Sodium 135 - 145 mmol/L 135  Potassium 3.5 - 5.1 mmol/L 4.3  Chloride 101 - 111 mmol/L 105  CO2 22 - 32 mmol/L 21(L)  Calcium 8.9 - 10.3 mg/dL 9.2   Intake/Output      11/28 0701 - 11/29 0700 11/29 0701 - 11/30 0700   I.V. (mL/kg) 1807.5 (20.4)    IV Piggyback 100    Total Intake(mL/kg) 1907.5 (21.6)    Urine (mL/kg/hr) 400    Blood 50    Total Output 450     Net +1457.5          Urine Occurrence 2 x      Physical Exam: General: NAD.  Upright in bed.  Knee on CPM Resp: No increased wob Cardio: regular rate and rhythm ABD soft Neurologically intact MSK RLE: Neurovascularly intact Sensation intact  distally Foot warm Dorsiflexion/Plantar flexion intact Incision: dressing C/D/I  Prudencio Burly III 06/16/2016, 8:57 AM

## 2016-06-16 NOTE — NC FL2 (Signed)
[place] LEVEL OF CARE SCREENING TOOL     IDENTIFICATION  Patient Name: William Skinner Birthdate: 02/24/1926 Sex: male Admission Date (Current Location): 06/15/2016  [facility] and [place] Number:  Herbalist and Address:  [facility] [place], [place] [address] [place], [place] [address]      Provider Number: [id number]  Attending Physician Name and Address:  Renette Butters, MD  Relative Name and Phone Number:       Current Level of Care: Hospital Recommended Level of Care: [place] Prior Approval Number:    Date Approved/Denied: 06/16/16 PASRR Number: [id number]  Discharge Plan: SNF    Current Diagnoses: Patient Active Problem List   Diagnosis Date Noted  . H/O carotid endarterectomy 06/01/2016  . Primary osteoarthritis of right knee 05/31/2016  . Essential hypertension 05/31/2016  . H/O spinal stenosis 05/31/2016  . Pain in joint, lower leg 03/06/2014    Orientation RESPIRATION BLADDER Height & Weight     Self, Time, Situation, Place  O2 (Nasal Cannula) Incontinent Weight: 195 lb (88.5 kg) Height:  5\' 10"  (177.8 cm)  BEHAVIORAL SYMPTOMS/MOOD NEUROLOGICAL BOWEL NUTRITION STATUS      Continent  (thin liquids)  AMBULATORY STATUS COMMUNICATION OF NEEDS Skin   Limited Assist Verbally Surgical wounds (Closed incision knee, compression wrap)                       Personal Care Assistance Level of Assistance  Bathing, Feeding, Dressing Bathing Assistance: Limited assistance Feeding assistance: Independent Dressing Assistance: Limited assistance     Functional Limitations Info  Sight, Hearing, Speech Sight Info: Adequate Hearing Info: Adequate Speech Info: Adequate    SPECIAL CARE FACTORS FREQUENCY  PT (By licensed PT), OT (By licensed OT)     PT Frequency: 3x week OT Frequency:  3xweek            Contractures Contractures Info: Not present    Additional Factors Info  Code Status,  Allergies Code Status Info: Full Allergies Info: No known allergies           Current Medications (06/16/2016):  This is the current hospital active medication list Current Facility-Administered Medications  Medication Dose Route Frequency Provider Last Rate Last Dose  . acetaminophen (TYLENOL) tablet 650 mg  650 mg Oral Q6H PRN Charna Elizabeth Martensen III, PA-C       Or  . acetaminophen (TYLENOL) suppository 650 mg  650 mg Rectal Q6H PRN Charna Elizabeth Martensen III, PA-C      . diphenhydrAMINE (BENADRYL) 12.5 MG/5ML elixir 12.5-25 mg  12.5-25 mg Oral Q4H PRN Charna Elizabeth Martensen III, PA-C      . docusate sodium (COLACE) capsule 100 mg  100 mg Oral BID Charna Elizabeth Martensen III, PA-C   100 mg at 06/16/16 1000  . HYDROcodone-acetaminophen (NORCO/VICODIN) 5-325 MG per tablet 1-2 tablet  1-2 tablet Oral Q4H PRN Prudencio Burly III, PA-C   1 tablet at 06/15/16 2111  . ketorolac (TORADOL) 15 MG/ML injection 7.5 mg  7.5 mg Intravenous Q6H Charna Elizabeth Martensen III, PA-C   7.5 mg at 06/16/16 0511  . lactated ringers infusion   Intravenous Continuous Lillia Abed, MD 50 mL/hr at 06/16/16 0802    . losartan (COZAAR) tablet 100 mg  100 mg Oral Daily Charna Elizabeth Martensen III, PA-C   100 mg at 06/16/16 1000  . menthol-cetylpyridinium (CEPACOL) lozenge 3 mg  1 lozenge Oral PRN Prudencio Burly III,  PA-C       Or  . phenol (CHLORASEPTIC) mouth spray 1 spray  1 spray Mouth/Throat PRN Charna Elizabeth Martensen III, PA-C      . methocarbamol (ROBAXIN) tablet 500 mg  500 mg Oral Q6H PRN Charna Elizabeth Martensen III, PA-C   500 mg at 06/15/16 2111   Or  . methocarbamol (ROBAXIN) 500 mg in dextrose 5 % 50 mL IVPB  500 mg Intravenous Q6H PRN Charna Elizabeth Martensen III, PA-C      . metoCLOPramide (REGLAN) tablet 5-10 mg  5-10 mg Oral Q8H PRN Charna Elizabeth Martensen III, PA-C       Or  . metoCLOPramide (REGLAN) injection 5-10 mg  5-10 mg Intravenous Q8H PRN Charna Elizabeth Martensen III, PA-C      .  morphine 2 MG/ML injection 1 mg  1 mg Intravenous Q2H PRN Charna Elizabeth Martensen III, PA-C      . ondansetron [facility]) tablet 4 mg  4 mg Oral Q6H PRN Charna Elizabeth Martensen III, PA-C       Or  . ondansetron (ZOFRAN) injection 4 mg  4 mg Intravenous Q6H PRN Charna Elizabeth Martensen III, PA-C      . pantoprazole (PROTONIX) EC tablet 40 mg  40 mg Oral Daily Charna Elizabeth Martensen III, PA-C   40 mg at 06/16/16 0911  . polyethylene glycol (MIRALAX / GLYCOLAX) packet 17 g  17 g Oral Daily PRN Charna Elizabeth Martensen III, PA-C      . rivaroxaban (XARELTO) tablet 10 mg  10 mg Oral Q breakfast Charna Elizabeth Martensen III, PA-C   10 mg at 06/16/16 0911  . senna (SENOKOT) tablet 8.6 mg  1 tablet Oral BID Charna Elizabeth Martensen III, PA-C   8.6 mg at 06/16/16 0911  . sorbitol 70 % solution 30 mL  30 mL Oral Daily PRN Charna Elizabeth Martensen III, PA-C      . tamsulosin (FLOMAX) capsule 0.8 mg  0.8 mg Oral Daily Charna Elizabeth Martensen III, PA-C   0.8 mg at 06/16/16 0911     Discharge Medications: Please see discharge summary for a list of discharge medications.  Relevant Imaging Results:  Relevant Lab Results:   Additional Information ssn: [id number]  Gerrianne Scale [place], LCSW

## 2016-06-16 NOTE — Discharge Instructions (Signed)

## 2016-06-16 NOTE — Progress Notes (Signed)
OT Cancellation Note  Patient Details Name: William Skinner MRN: [id number] DOB: 07/06/1926   Cancelled Treatment:    Reason Eval/Treat Not Completed: Other (comment) Pt is Medicare and current D/C plan is SNF. No apparent immediate acute care OT needs, therefore will defer OT to SNF. If OT eval is needed please call Acute Rehab Dept. at [phone] or text page OT at [phone].   [place], OTR/L  [id number] 06/16/2016 06/16/2016, 2:59 PM

## 2016-06-16 NOTE — Clinical Social Work Note (Signed)
CSW talked with patient's daughter-in-law, Deyan Leavins (works at [employer]) regarding discharge planning for patient. Molena requested for ST rehab and CSW informed that she has made contact with Ivin Booty at [facility] Full assessment to follow.  Cristo Ausburn Givens, MSW, LCSW Licensed Clinical Social Worker [place] [phone]

## 2016-06-16 NOTE — Anesthesia Postprocedure Evaluation (Signed)
Anesthesia Post Note  Patient: William Skinner  Procedure(s) Performed: Procedure(s) (LRB): RIGHT TOTAL KNEE ARTHROPLASTY (Right)  Patient location during evaluation: PACU Anesthesia Type: Spinal Level of consciousness: oriented and awake and alert Pain management: pain level controlled Vital Signs Assessment: post-procedure vital signs reviewed and stable Respiratory status: spontaneous breathing, respiratory function stable and patient connected to nasal cannula oxygen Cardiovascular status: blood pressure returned to baseline and stable Postop Assessment: no headache and no backache Anesthetic complications: no    Last Vitals:  Vitals:   06/16/16 1416 06/16/16 2016  BP: 121/61 120/67  Pulse: (!) 105 96  Resp: 17 17  Temp: 36.6 C 36.4 C    Last Pain:  Vitals:   06/16/16 2016  TempSrc: Oral  PainSc:                  Korde Jeppsen DAVID

## 2016-06-17 DIAGNOSIS — R278 Other lack of coordination: Secondary | ICD-10-CM | POA: Diagnosis not present

## 2016-06-17 DIAGNOSIS — I10 Essential (primary) hypertension: Secondary | ICD-10-CM | POA: Diagnosis not present

## 2016-06-17 DIAGNOSIS — M25569 Pain in unspecified knee: Secondary | ICD-10-CM | POA: Diagnosis not present

## 2016-06-17 DIAGNOSIS — R2681 Unsteadiness on feet: Secondary | ICD-10-CM | POA: Diagnosis not present

## 2016-06-17 DIAGNOSIS — K5900 Constipation, unspecified: Secondary | ICD-10-CM | POA: Diagnosis not present

## 2016-06-17 DIAGNOSIS — N400 Enlarged prostate without lower urinary tract symptoms: Secondary | ICD-10-CM | POA: Diagnosis not present

## 2016-06-17 DIAGNOSIS — R350 Frequency of micturition: Secondary | ICD-10-CM | POA: Diagnosis not present

## 2016-06-17 DIAGNOSIS — R2689 Other abnormalities of gait and mobility: Secondary | ICD-10-CM | POA: Diagnosis not present

## 2016-06-17 DIAGNOSIS — Z471 Aftercare following joint replacement surgery: Secondary | ICD-10-CM | POA: Diagnosis not present

## 2016-06-17 DIAGNOSIS — N401 Enlarged prostate with lower urinary tract symptoms: Secondary | ICD-10-CM | POA: Diagnosis not present

## 2016-06-17 DIAGNOSIS — K219 Gastro-esophageal reflux disease without esophagitis: Secondary | ICD-10-CM | POA: Diagnosis not present

## 2016-06-17 DIAGNOSIS — M1711 Unilateral primary osteoarthritis, right knee: Secondary | ICD-10-CM | POA: Diagnosis not present

## 2016-06-17 DIAGNOSIS — D62 Acute posthemorrhagic anemia: Secondary | ICD-10-CM | POA: Diagnosis not present

## 2016-06-17 DIAGNOSIS — Z9889 Other specified postprocedural states: Secondary | ICD-10-CM | POA: Diagnosis not present

## 2016-06-17 DIAGNOSIS — Z96651 Presence of right artificial knee joint: Secondary | ICD-10-CM | POA: Diagnosis not present

## 2016-06-17 DIAGNOSIS — M1990 Unspecified osteoarthritis, unspecified site: Secondary | ICD-10-CM | POA: Diagnosis not present

## 2016-06-17 DIAGNOSIS — D638 Anemia in other chronic diseases classified elsewhere: Secondary | ICD-10-CM | POA: Diagnosis not present

## 2016-06-17 NOTE — Progress Notes (Signed)
Reviewed discharge information/medications with patient.  Attempted to call [facility] to give report, went to voicemail.  I left a message, however I will try calling them back if I do not hear from them.  Removed patient's IV. Patient is waiting on transport.

## 2016-06-17 NOTE — Progress Notes (Signed)
Physical Therapy Treatment Patient Details Name: William Skinner MRN: [id number] DOB: February 03, 1926 Today's Date: 06/17/2016    History of Present Illness s/p R UKR; PMHx: back pain, HTN, L knee surgery    PT Comments    Pt demonstrates improved ability to perform gait for short distances. Pt continues to require assistance to perform sit to stand transfers and bed mobs due to decreased ability to scoot RLE forward during these activities. Pt continues to be appropriate for SNF placement for rehab.   Follow Up Recommendations  SNF;Supervision/Assistance - 24 hour     Equipment Recommendations  Rolling walker with 5" wheels;Other (comment) (defer to next venue)    Recommendations for Other Services       Precautions / Restrictions Precautions Precautions: Fall Restrictions Weight Bearing Restrictions: Yes RLE Weight Bearing: Weight bearing as tolerated    Mobility  Bed Mobility Overal bed mobility: Needs Assistance Bed Mobility: Supine to Sit     Supine to sit: Mod assist     General bed mobility comments: bed pad used to assist scooting, requires assist with RLE off bed and to elevate trunk, incr time  Transfers Overall transfer level: Needs assistance Equipment used: Rolling walker (2 wheeled) Transfers: Sit to/from Stand Sit to Stand: Min assist         General transfer comment: Min A from EOB  Ambulation/Gait Ambulation/Gait assistance: Min guard Ambulation Distance (Feet): 15 Feet Assistive device: Rolling walker (2 wheeled) Gait Pattern/deviations: Step-to pattern;Decreased stance time - right;Decreased stance time - left;Antalgic Gait velocity: decreased Gait velocity interpretation: Below normal speed for age/gender General Gait Details: moderate antalgic gait noted    Stairs            Wheelchair Mobility    Modified Rankin (Stroke Patients Only)       Balance                                    Cognition  Arousal/Alertness: Awake/alert Behavior During Therapy: WFL for tasks assessed/performed Overall Cognitive Status: Within Functional Limits for tasks assessed       Memory: Decreased short-term memory              Exercises Total Joint Exercises Ankle Circles/Pumps: AROM;Both;10 reps Quad Sets: AROM;Both;10 reps Heel Slides: AAROM;Right;5 reps    General Comments        Pertinent Vitals/Pain Pain Assessment: 0-10 Pain Score: 0-No pain    Home Living                      Prior Function            PT Goals (current goals can now be found in the care plan section) Acute Rehab PT Goals Patient Stated Goal: get stronger Progress towards PT goals: Progressing toward goals    Frequency    7X/week      PT Plan Current plan remains appropriate    Co-evaluation             End of Session           Time: [id number] PT Time Calculation (min) (ACUTE ONLY): 39 min  Charges:  $Gait Training: 8-22 mins $Therapeutic Exercise: 8-22 mins $Therapeutic Activity: 8-22 mins                    G Codes:      Scheryl Marten PT, DPT  [id number]  06/17/2016, 10:33 AM

## 2016-06-17 NOTE — Progress Notes (Signed)
Orthopedic Tech Progress Note Patient Details:  William Skinner 15-Sep-1925 [id number]  Patient ID: William Skinner, male   DOB: 1926/02/26, 82 y.o.   MRN: [id number] Applied cpm 0-60  Karolee Stamps 06/17/2016, 6:23 AM

## 2016-06-17 NOTE — Discharge Summary (Signed)
Discharge Summary  Patient ID: William Skinner MRN: [id number] DOB/AGE: 11/15/1925 82 y.o.  Admit date: 06/15/2016 Discharge date: 06/17/2016  Admission Diagnoses:  Primary osteoarthritis of right knee  Discharge Diagnoses:  Principal Problem:   Primary osteoarthritis of right knee Active Problems:   Essential hypertension   H/O spinal stenosis   H/O carotid endarterectomy   Past Medical History:  Diagnosis Date  . Back pain   . Carotid artery occlusion   . Hyperlipidemia   . Hypertension   . Primary osteoarthritis of knee    right  . Right leg pain     Surgeries: Procedure(s): RIGHT TOTAL KNEE ARTHROPLASTY on 06/15/2016   Consultants (if any):   Discharged Condition: Improved  Progress  Subjective: Feeling well.  More sore today.  Pain controlled with PO meds.  Tolerating diet.  Urinating.  No CP, SOB.  Objective: General: NAD.  Upright in bed. Resp: No increased WOB Cardio: regular rate and rhythm ABD soft Neurologically: conversant.  Responds appropriately, but does need to be re-oriented intermittently per nursing team. MSK Neurovascularly intact Sensation intact distally Intact pulses distally Dorsiflexion/Plantar flexion intact Incision: dressing C/D/I  Plan: Up with therapy D/C IV fluids Weight Bearing: Weight Bearing as Tolerated (WBAT) right leg Dressings: prn.  VTE prophylaxis: Xarelto, ambulation, SCDs Dispo: Skilled Nursing Facility/Rehab today  Hospital Course: William Skinner is an 82 y.o. male who was admitted 06/15/2016 with a diagnosis of Primary osteoarthritis of right knee and went to the operating room on 06/15/2016 and underwent the above named procedures.    He was given perioperative antibiotics:  Anti-infectives    Start     Dose/Rate Route Frequency Ordered Stop   06/15/16 1830  ceFAZolin (ANCEF) IVPB 2g/100 mL premix     2 g 200 mL/hr over 30 Minutes Intravenous Every 6 hours 06/15/16 1805 06/16/16 0629   06/15/16 1016   ceFAZolin (ANCEF) IVPB 2g/100 mL premix     2 g 200 mL/hr over 30 Minutes Intravenous On call to O.R. 06/15/16 1016 06/15/16 1330    .  He was given sequential compression devices, early ambulation, and Xarelto for DVT prophylaxis.  He benefited maximally from the hospital stay and there were no complications.    Recent vital signs:  Vitals:   06/16/16 2016 06/17/16 0500  BP: 120/67 122/72  Pulse: 96 98  Resp: 17 16  Temp: 97.5 F (36.4 C) 97.1 F (36.2 C)    Recent laboratory studies:  Lab Results  Component Value Date   HGB 12.7 (L) 06/03/2016   Lab Results  Component Value Date   WBC 5.5 06/03/2016   PLT 168 06/03/2016   Lab Results  Component Value Date   INR 1.13 06/03/2016   Lab Results  Component Value Date   NA 135 06/03/2016   K 4.3 06/03/2016   CL 105 06/03/2016   CO2 21 (L) 06/03/2016   BUN 27 (H) 06/03/2016   CREATININE 1.05 06/03/2016   GLUCOSE 102 (H) 06/03/2016    Discharge Medications:     Medication List    TAKE these medications   acetaminophen 500 MG tablet Commonly known as:  TYLENOL Take 500 mg by mouth every 6 (six) hours as needed for moderate pain or headache. Reported on 07/29/2015   aspirin 81 MG tablet Take 81 mg by mouth daily.   baclofen 10 MG tablet Commonly known as:  LIORESAL Take 1 tablet (10 mg total) by mouth 3 (three) times daily as needed for muscle spasms.  cholecalciferol 1000 units tablet Commonly known as:  VITAMIN D Take 1,000 Units by mouth daily.   clobetasol cream 0.05 % Commonly known as:  TEMOVATE Apply 1 application topically 2 (two) times daily as needed (rash).   co-enzyme Q-10 50 MG capsule Take 100 mg by mouth daily.   docusate sodium 100 MG capsule Commonly known as:  COLACE Take 1 capsule (100 mg total) by mouth 2 (two) times daily.   HYDROcodone-acetaminophen 5-325 MG tablet Commonly known as:  NORCO Take 1-2 tablets by mouth every 4 (four) hours as needed for moderate pain.    losartan 100 MG tablet Commonly known as:  COZAAR Take 100 mg by mouth daily.   meloxicam 15 MG tablet Commonly known as:  MOBIC Take 15 mg by mouth daily.   omeprazole 20 MG capsule Commonly known as:  PRILOSEC Take 20 mg by mouth daily.   ondansetron 4 MG tablet Commonly known as:  ZOFRAN Take 1 tablet (4 mg total) by mouth every 8 (eight) hours as needed for nausea or vomiting.   rivaroxaban 10 MG Tabs tablet Commonly known as:  XARELTO Take 1 tablet (10 mg total) by mouth daily.   SELENIUM PO Take 100 mg by mouth daily.   tamsulosin 0.4 MG Caps capsule Commonly known as:  FLOMAX Take 0.8 mg by mouth daily.   vitamin C 500 MG tablet Commonly known as:  ASCORBIC ACID Take 500 mg by mouth daily.   vitamin E 200 UNIT capsule Take 200 Units by mouth daily.   Zinc 30 MG Caps Take 30 mg by mouth daily.       Diagnostic Studies: Dg Knee Right Port  Result Date: 06/15/2016 CLINICAL DATA:  82 year old male status post knee arthroplasty. Osteoarthritis. Initial encounter. EXAM: PORTABLE RIGHT KNEE - 1-2 VIEW COMPARISON:  None. FINDINGS: Portable AP and cross-table lateral views of the right knee at 1613 hours. Medial hemi arthroplasty changes are noted. Arthroplasty hardware appears intact and alignment appears within normal limits. Superimposed air-fluid level in the right knee joint. Superimposed severe medial and patellofemoral compartment degenerative changes with osteophytosis. No other acute osseous abnormality identified. IMPRESSION: 1. Right knee medial hemiarthroplasty changes with no adverse features. 2. Underlying advanced medial and patellofemoral compartment joint degeneration. Electronically Signed   By: Genevie Ann M.D.   On: 06/15/2016 16:32    Disposition: SNF - [facility]  Follow-up Information    Skinner, William D, MD Follow up.   Specialty:  Orthopedic Surgery Contact information: [address] [place] [address] [phone]            Signed: Prudencio Burly III PA-C 06/17/2016, 6:41 AM

## 2016-06-17 NOTE — Clinical Social Work Note (Addendum)
Clinical Social Worker facilitated patient discharge including contacting patient family and facility to confirm patient discharge plans.  Clinical information faxed to facility and family agreeable with plan.  CSW arranged ambulance transport via [facility] to [place].  RN to call [phone] for report prior to discharge .  Clinical Social Worker will sign off for now as social work intervention is no longer needed. Please consult [place] again if new need arises.  [address], [place]

## 2016-06-17 NOTE — Consult Note (Signed)
            [facility] CM Primary Care Navigator  06/17/2016  PAU ZELDIN 11-20-1925 [id number]     Wentto see patient at the bedside to identify possible discharge needs butpatient was already discharged per RN.  Primary care provider's office contacted(Bonnie)to notify of patient's discharge to skilled nursing facility and need for post SNF follow-up and transition of care. Made aware to refer patient to [facility] care management if deemed appropriate for services.   For additional questions please contact:  Edwena Felty A. Baelyn Doring, BSN, RN-BC [facility] PRIMARY CARE Navigator Cell: [phone]

## 2016-06-17 NOTE — Clinical Social Work Placement (Signed)
   CLINICAL SOCIAL WORK PLACEMENT  NOTE  Date:  06/17/2016  Patient Details  Name: William Skinner MRN: [id number] Date of Birth: 03-Jun-1926  Clinical Social Work is seeking post-discharge placement for this patient at the [place] level of care (*CSW will initial, date and re-position this form in  chart as items are completed):  Yes   Patient/family provided with [place] Work Department's list of facilities offering this level of care within the geographic area requested by the patient (or if unable, by the patient's family).  Yes   Patient/family informed of their freedom to choose among providers that offer the needed level of care, that participate in Medicare, Medicaid or managed care program needed by the patient, have an available bed and are willing to accept the patient.      Patient/family informed of [place]'s ownership interest in [facility] and [facility], as well as of the fact that they are under no obligation to receive care at these facilities.  PASRR submitted to EDS on       PASRR number received on       Existing PASRR number confirmed on 06/16/16     FL2 transmitted to all facilities in geographic area requested by pt/family on 06/16/16     FL2 transmitted to all facilities within larger geographic area on       Patient informed that his/her managed care company has contracts with or will negotiate with certain facilities, including the following:        Yes   Patient/family informed of bed offers received.  Patient chooses bed at [facility]     Physician recommends and patient chooses bed at      Patient to be transferred to [facility] on 06/17/16.  Patient to be transferred to facility by [facility]     Patient family notified on 06/17/16 of transfer.  Name of family member notified:  Catalina Lunger     PHYSICIAN Please prepare prescriptions     Additional Comment:     _______________________________________________ Alla German, LCSW 06/17/2016, 10:55 AM

## 2016-06-17 NOTE — Clinical Social Work Note (Signed)
Clinical Social Work Assessment  Patient Details  Name: William Skinner MRN: [id number] Date of Birth: 1926-06-27  Date of referral:  06/17/16               Reason for consult:  Facility Placement                Permission sought to share information with:  Family Supports Permission granted to share information::     Name::     Catalina Lunger  Agency::     Relationship::  Daughter  Contact Information:     Housing/Transportation Living arrangements for the past 2 months:  [place] of Information:  Adult Children Patient Interpreter Needed:  None Criminal Activity/Legal Involvement Pertinent to Current Situation/Hospitalization:  No - Comment as needed Significant Relationships:  Adult Children Lives with:  Facility Resident, Self Do you feel safe going back to the place where you live?  Yes Need for family participation in patient care:  Yes (Comment)  Care giving concerns:  Per Rn pt is confused this AM. CSW reached out to pt's daughter. Pt's daughter is agreeable to SNF placement and prefers her mom go to [facility].   Social Worker assessment / plan: CSW spoke with pt's daughter. Pt's daughter reports pt lived in a independent living facility prior to admission however, has a new room at a assisted living that he will return to after SNF. Pt's daughter agreeable for pt to go to [facility] at this time.  Employment status:  Retired Forensic scientist:  Medicare PT Recommendations:  [place] / Referral to community resources:  [place]  Patient/Family's Response to care:  Pt's daughter verbalized understanding of CSW role and appreciation of support.  Patient/Family's Understanding of and Emotional Response to Diagnosis, Current Treatment, and Prognosis:  Pt's daughter understanding and realistic regarding pt's physical limitations. Pt's daughter denies any questions or concerns about pt's treatment plan at this  time.  Emotional Assessment Appearance:  Appears stated age Attitude/Demeanor/Rapport:  Unable to Assess Affect (typically observed):  Unable to Assess Orientation:  Oriented to Self, Oriented to Situation Alcohol / Substance use:  Not Applicable Psych involvement (Current and /or in the community):  No (Comment)  Discharge Needs  Concerns to be addressed:  No discharge needs identified Readmission within the last 30 days:  No Current discharge risk:  Dependent with Mobility Barriers to Discharge:  Continued Medical Work up   [employer], LCSW 06/17/2016, 10:51 AM

## 2016-06-18 ENCOUNTER — Encounter: Payer: Self-pay | Admitting: Internal Medicine

## 2016-06-18 ENCOUNTER — Non-Acute Institutional Stay (SKILLED_NURSING_FACILITY): Payer: Medicare Other | Admitting: Internal Medicine

## 2016-06-18 DIAGNOSIS — N401 Enlarged prostate with lower urinary tract symptoms: Secondary | ICD-10-CM

## 2016-06-18 DIAGNOSIS — K5900 Constipation, unspecified: Secondary | ICD-10-CM | POA: Diagnosis not present

## 2016-06-18 DIAGNOSIS — R2681 Unsteadiness on feet: Secondary | ICD-10-CM | POA: Diagnosis not present

## 2016-06-18 DIAGNOSIS — Z9889 Other specified postprocedural states: Secondary | ICD-10-CM | POA: Diagnosis not present

## 2016-06-18 DIAGNOSIS — I10 Essential (primary) hypertension: Secondary | ICD-10-CM | POA: Diagnosis not present

## 2016-06-18 DIAGNOSIS — K219 Gastro-esophageal reflux disease without esophagitis: Secondary | ICD-10-CM

## 2016-06-18 DIAGNOSIS — M1711 Unilateral primary osteoarthritis, right knee: Secondary | ICD-10-CM | POA: Diagnosis not present

## 2016-06-18 DIAGNOSIS — D62 Acute posthemorrhagic anemia: Secondary | ICD-10-CM | POA: Diagnosis not present

## 2016-06-18 DIAGNOSIS — R350 Frequency of micturition: Secondary | ICD-10-CM

## 2016-06-18 NOTE — Progress Notes (Signed)
LOCATION: [facility]  PCP: Melinda Crutch, MD   Code Status: Full Code  Goals of care: Advanced Directive information Advanced Directives 06/03/2016  Does Patient Have a Medical Advance Directive? No  Type of Advance Directive -  Copy of [place] in Chart? -  Would patient like information on creating a medical advance directive? Yes - Educational materials given       Extended Emergency Contact Information Primary Emergency Contact: Silvio, [place] Phone: [phone] Relation: Daughter   No Known Allergies  Chief Complaint  Patient presents with  . New Admit To SNF    New Admission Visit     HPI:  Patient is a 82 y.o. male seen today for short term rehabilitation post hospital admission from 06/15/16-06/17/16 with right knee OA. He underwent right total knee arthroplasty. He is seen in his room today.  Review of Systems:  Constitutional: Negative for fever, chills, diaphoresis.  HENT: Negative for headache, congestion, nasal discharge Eyes: Negative for blurred vision, double vision and discharge.  Respiratory: Negative for cough, shortness of breath and wheezing.   Cardiovascular: Negative for chest pain, palpitations, leg swelling.  Gastrointestinal: Negative for heartburn, nausea, vomiting, abdominal pain. Last bowel movement was before surgery. Genitourinary: Negative for dysuria and flank pain.  Musculoskeletal: Negative for back pain, fall in the facility. knee pain under control with current medication regimen.  Skin: Negative for itching, rash.  Neurological: Negative for dizziness. Psychiatric/Behavioral: Negative for depression   Past Medical History:  Diagnosis Date  . Back pain   . Carotid artery occlusion   . Hyperlipidemia   . Hypertension   . Primary osteoarthritis of knee    right  . Right leg pain    Past Surgical History:  Procedure Laterality Date  . CAROTID ENDARTERECTOMY Left     . CATARACT EXTRACTION Bilateral   . COLONOSCOPY W/ BIOPSIES AND POLYPECTOMY    . HERNIA REPAIR    . KNEE SURGERY Left   . NOSE SURGERY    . TONSILLECTOMY    . TOTAL KNEE ARTHROPLASTY Right 06/15/2016   Procedure: RIGHT TOTAL KNEE ARTHROPLASTY;  Surgeon: Renette Butters, MD;  Location: [facility];  Service: Orthopedics;  Laterality: Right;   Social History:   reports that he quit smoking about 52 years ago. He has never used smokeless tobacco. He reports that he drinks alcohol. He reports that he does not use drugs.  No family history on file.  Medications:   Medication List       Accurate as of 06/18/16  3:21 PM. Always use your most recent med list.          acetaminophen 500 MG tablet Commonly known as:  TYLENOL Take 500 mg by mouth every 6 (six) hours as needed for moderate pain or headache. Reported on 07/29/2015   aspirin 81 MG tablet Take 81 mg by mouth daily.   baclofen 10 MG tablet Commonly known as:  LIORESAL Take 1 tablet (10 mg total) by mouth 3 (three) times daily as needed for muscle spasms.   cholecalciferol 1000 units tablet Commonly known as:  VITAMIN D Take 1,000 Units by mouth daily.   clobetasol cream 0.05 % Commonly known as:  TEMOVATE Apply 1 application topically 2 (two) times daily as needed (rash).   co-enzyme Q-10 50 MG capsule Take 100 mg by mouth daily.   docusate sodium 100 MG capsule Commonly known as:  COLACE Take 1 capsule (100  mg total) by mouth 2 (two) times daily.   HYDROcodone-acetaminophen 5-325 MG tablet Commonly known as:  NORCO Take 1-2 tablets by mouth every 4 (four) hours as needed for moderate pain.   losartan 100 MG tablet Commonly known as:  COZAAR Take 100 mg by mouth daily.   meloxicam 15 MG tablet Commonly known as:  MOBIC Take 15 mg by mouth daily.   omeprazole 20 MG capsule Commonly known as:  PRILOSEC Take 20 mg by mouth daily.   ondansetron 4 MG tablet Commonly known as:  ZOFRAN Take 1 tablet (4 mg  total) by mouth every 8 (eight) hours as needed for nausea or vomiting.   rivaroxaban 10 MG Tabs tablet Commonly known as:  XARELTO Take 1 tablet (10 mg total) by mouth daily.   selenium 50 MCG Tabs tablet Take 50 mcg by mouth daily.   tamsulosin 0.4 MG Caps capsule Commonly known as:  FLOMAX Take 0.8 mg by mouth daily.   vitamin C 500 MG tablet Commonly known as:  ASCORBIC ACID Take 500 mg by mouth daily.   vitamin E 200 UNIT capsule Take 200 Units by mouth daily.   zinc gluconate 50 MG tablet Take 20 mg by mouth daily.       Immunizations: Immunization History  Administered Date(s) Administered  . PPD Test 06/17/2016     Physical Exam: Vitals:   06/18/16 1516  BP: 138/86  Pulse: 86  Resp: 18  Temp: 98.2 F (36.8 C)  TempSrc: Oral  SpO2: 92%  Weight: 240 lb (108.9 kg)  Height: 6' (1.829 m)   Body mass index is 32.55 kg/m.  General- elderly male, obese, in no acute distress Head- normocephalic, atraumatic Nose- no maxillary or frontal sinus tenderness, no nasal discharge Throat- moist mucus membrane  Eyes- PERRLA, EOMI, no pallor, no icterus, no discharge, normal conjunctiva, normal sclera Neck- no cervical lymphadenopathy Cardiovascular- normal s1,s2, no murmur Respiratory- bilateral clear to auscultation, no wheeze, no rhonchi, no crackles, no use of accessory muscles Abdomen- bowel sounds present, soft, non tender Musculoskeletal- able to move all 4 extremities, limited right knee ROM, trace right leg edema Neurological- alert and oriented to person and month but not to year and place Skin- warm and dry, right knee with ace wrap dressing Psychiatry- normal mood and affect    Labs reviewed: Basic Metabolic Panel:  Recent Labs  06/03/16 1620  NA 135  K 4.3  CL 105  CO2 21*  GLUCOSE 102*  BUN 27*  CREATININE 1.05  CALCIUM 9.2   Liver Function Tests: No results for input(s): AST, ALT, ALKPHOS, BILITOT, PROT, ALBUMIN in the last 8760  hours. No results for input(s): LIPASE, AMYLASE in the last 8760 hours. No results for input(s): AMMONIA in the last 8760 hours. CBC:  Recent Labs  06/03/16 1620  WBC 5.5  HGB 12.7*  HCT 38.8*  MCV 95.1  PLT 168   Cardiac Enzymes: No results for input(s): CKTOTAL, CKMB, CKMBINDEX, TROPONINI in the last 8760 hours. BNP: Invalid input(s): POCBNP CBG: No results for input(s): GLUCAP in the last 8760 hours.  Radiological Exams: Dg Knee Right Port  Result Date: 06/15/2016 CLINICAL DATA:  82 year old male status post knee arthroplasty. Osteoarthritis. Initial encounter. EXAM: PORTABLE RIGHT KNEE - 1-2 VIEW COMPARISON:  None. FINDINGS: Portable AP and cross-table lateral views of the right knee at 1613 hours. Medial hemi arthroplasty changes are noted. Arthroplasty hardware appears intact and alignment appears within normal limits. Superimposed air-fluid level in the right knee  joint. Superimposed severe medial and patellofemoral compartment degenerative changes with osteophytosis. No other acute osseous abnormality identified. IMPRESSION: 1. Right knee medial hemiarthroplasty changes with no adverse features. 2. Underlying advanced medial and patellofemoral compartment joint degeneration. Electronically Signed   By: Genevie Ann M.D.   On: 06/15/2016 16:32    Assessment/Plan  Unsteady gait With right knee arthroplasty. Will have patient work with PT/OT as tolerated to regain strength and restore function.  Fall precautions are in place.  Right knee OA S/p right total knee arthroplasty. Has orthopedic follow up. Will have him work with physical therapy and occupational therapy team to help with gait training and muscle strengthening exercises.fall precautions. Skin care. Encourage to be out of bed. Continue xarelto for dvt prophylaxis. WBAT to RLE. Continue mobic daily. Continue norco 5-325 mg 1-2 tab q4h prn pain. Continue baclofen 10 mg q8h prn muscle spasm. PMR consult.  Blood loss  anemia Post op, monitor cbc  gerd Continue prilosec, no change made  Constipation On colace 100 mg bid  Hypertension Continue cozaar, monitor BP  BPH  Continue his flomax to help with urinary frequency and hesitancy  History of carotid endarterectomy Continue aspirin   Goals of care: short term rehabilitation   Labs/tests ordered: cbc, bmp 12/4  Family/ staff Communication: reviewed care plan with patient and nursing supervisor    Blanchie Serve, MD Internal Medicine [facility] [place], [place] [address] Cell Phone (Monday-Friday 8 am - 5 pm): [phone] On Call: [phone] and follow prompts after 5 pm and on weekends Office Phone: [phone] Office Fax: [phone]

## 2016-06-21 LAB — CBC AND DIFFERENTIAL
HEMATOCRIT: 40 % — AB (ref 41–53)
HEMOGLOBIN: 13.4 g/dL — AB (ref 13.5–17.5)
NEUTROS ABS: 4 /uL
Platelets: 192 10*3/uL (ref 150–399)
WBC: 6.1 10^3/mL

## 2016-06-21 LAB — BASIC METABOLIC PANEL
BUN: 32 mg/dL — AB (ref 4–21)
Creatinine: 1.1 mg/dL (ref 0.6–1.3)
GLUCOSE: 128 mg/dL
POTASSIUM: 4.3 mmol/L (ref 3.4–5.3)
SODIUM: 140 mmol/L (ref 137–147)

## 2016-06-23 ENCOUNTER — Encounter: Payer: Self-pay | Admitting: Adult Health

## 2016-06-23 ENCOUNTER — Non-Acute Institutional Stay (SKILLED_NURSING_FACILITY): Payer: Medicare Other | Admitting: Adult Health

## 2016-06-23 DIAGNOSIS — I10 Essential (primary) hypertension: Secondary | ICD-10-CM | POA: Diagnosis not present

## 2016-06-23 DIAGNOSIS — Z9889 Other specified postprocedural states: Secondary | ICD-10-CM | POA: Diagnosis not present

## 2016-06-23 DIAGNOSIS — K219 Gastro-esophageal reflux disease without esophagitis: Secondary | ICD-10-CM

## 2016-06-23 DIAGNOSIS — K5900 Constipation, unspecified: Secondary | ICD-10-CM | POA: Diagnosis not present

## 2016-06-23 DIAGNOSIS — R350 Frequency of micturition: Secondary | ICD-10-CM | POA: Diagnosis not present

## 2016-06-23 DIAGNOSIS — M1711 Unilateral primary osteoarthritis, right knee: Secondary | ICD-10-CM

## 2016-06-23 DIAGNOSIS — N401 Enlarged prostate with lower urinary tract symptoms: Secondary | ICD-10-CM | POA: Diagnosis not present

## 2016-06-23 DIAGNOSIS — R2681 Unsteadiness on feet: Secondary | ICD-10-CM | POA: Diagnosis not present

## 2016-06-23 NOTE — Progress Notes (Signed)
DATE:  06/23/2016   MRN:  [id number]  BIRTHDAY: Oct 15, 1925  Facility:  Nursing Home Location:  [place] Room Number: 1206-P  LEVEL OF CARE:  SNF (31)  Contact Information    Name Relation Home Work Mobile   Barclay, Look Daughter [phone]         Code Status History    Date Active Date Inactive Code Status Order ID Comments User Context   06/15/2016  6:05 PM 06/17/2016  3:05 PM Full Code [id number]  Sindy Messing, PA-C Inpatient       Chief Complaint  Patient presents with  . Discharge Note    HISTORY OF PRESENT ILLNESS:  This is an 82 year old male who is for discharge home with home health PT and OT.  He has been admitted to [facility] on 06/17/16 from [facility] hospitalization 06/15/16 through 06/17/16. He has right knee osteoarthritis for which she had right total knee arthroplasty on 06/15/16.  Patient was admitted to this facility for short-term rehabilitation after the patient's recent hospitalization.  Patient has completed SNF rehabilitation and therapy has cleared the patient for discharge.    PAST MEDICAL HISTORY:  Past Medical History:  Diagnosis Date  . Back pain   . Carotid artery occlusion   . Hyperlipidemia   . Hypertension   . Primary osteoarthritis of knee    right  . Right leg pain      CURRENT MEDICATIONS: Reviewed  Patient's Medications  New Prescriptions   No medications on file  Previous Medications   ACETAMINOPHEN (TYLENOL) 500 MG TABLET    Take 500 mg by mouth every 6 (six) hours as needed for moderate pain or headache. Reported on 07/29/2015   ASPIRIN 81 MG TABLET    Take 81 mg by mouth daily.    BACLOFEN (LIORESAL) 10 MG TABLET    Take 1 tablet (10 mg total) by mouth 3 (three) times daily as needed for muscle spasms.   CHOLECALCIFEROL (VITAMIN D) 1000 UNITS TABLET    Take 1,000 Units by mouth daily.   CLOBETASOL CREAM (TEMOVATE) 0.05 %    Apply 1 application  topically 2 (two) times daily as needed (rash).    CO-ENZYME Q-10 50 MG CAPSULE    Take 100 mg by mouth daily.   DOCUSATE SODIUM (COLACE) 100 MG CAPSULE    Take 1 capsule (100 mg total) by mouth 2 (two) times daily.   HYDROCODONE-ACETAMINOPHEN (NORCO) 5-325 MG TABLET    Take 1-2 tablets by mouth every 4 (four) hours as needed for moderate pain.   LOSARTAN (COZAAR) 100 MG TABLET    Take 100 mg by mouth daily.   MELOXICAM (MOBIC) 15 MG TABLET    Take 15 mg by mouth daily.    OMEPRAZOLE (PRILOSEC) 20 MG CAPSULE    Take 20 mg by mouth daily.    ONDANSETRON (ZOFRAN) 4 MG TABLET    Take 1 tablet (4 mg total) by mouth every 8 (eight) hours as needed for nausea or vomiting.   RIVAROXABAN (XARELTO) 10 MG TABS TABLET    Take 1 tablet (10 mg total) by mouth daily.   SELENIUM 50 MCG TABS TABLET    Take 50 mcg by mouth daily.   TAMSULOSIN (FLOMAX) 0.4 MG CAPS CAPSULE    Take 0.8 mg by mouth daily.    VITAMIN C (ASCORBIC ACID) 500 MG TABLET    Take 500 mg by mouth daily.   VITAMIN  E 200 UNIT CAPSULE    Take 200 Units by mouth daily.   ZINC 30 MG CAPS    Take 30 mg by mouth daily.  Modified Medications   No medications on file  Discontinued Medications   ZINC GLUCONATE 50 MG TABLET    Take 20 mg by mouth daily.     No Known Allergies   REVIEW OF SYSTEMS:  GENERAL: no change in appetite, no fatigue, no weight changes, no fever, chills or weakness EYES: Denies change in vision, dry eyes, eye pain, itching or discharge EARS: Denies change in hearing, ringing in ears, or earache NOSE: Denies nasal congestion or epistaxis MOUTH and THROAT: Denies oral discomfort, gingival pain or bleeding, pain from teeth or hoarseness   RESPIRATORY: no cough, SOB, DOE, wheezing, hemoptysis CARDIAC: no chest pain, or palpitations GI: no abdominal pain, diarrhea, constipation, heart burn, nausea or vomiting GU: Denies dysuria, frequency, hematuria, incontinence, or discharge PSYCHIATRIC: Denies feeling of depression or  anxiety. No report of hallucinations, insomnia, paranoia, or agitation    PHYSICAL EXAMINATION  GENERAL APPEARANCE: Well nourished. In no acute distress. Obese SKIN:  Right knee surgical incision has Steri-Strips and dry dressing, no erythema HEAD: Normal in size and contour. No evidence of trauma EYES: Lids open and close normally. No blepharitis, entropion or ectropion. PERRL. Conjunctivae are clear and sclerae are white. Lenses are without opacity EARS: Pinnae are normal. Patient hears normal voice tunes of the examiner MOUTH and THROAT: Lips are without lesions. Oral mucosa is moist and without lesions. Tongue is normal in shape, size, and color and without lesions NECK: supple, trachea midline, no neck masses, no thyroid tenderness, no thyromegaly LYMPHATICS: no LAN in the neck, no supraclavicular LAN RESPIRATORY: breathing is even & unlabored, BS CTAB CARDIAC: RRR, no murmur,no extra heart sounds, RLE 1+ edema GI: abdomen soft, normal BS, no masses, no tenderness, no hepatomegaly, no splenomegaly EXTREMITIES:  Able to move 4 extremities PSYCHIATRIC: Alert to place and person, disoriented to time. Affect and behavior are appropriate  LABS/RADIOLOGY: Labs reviewed: 06/21/16  WBC 6.1 hemoglobin 13.4 hematocrit 40.2 MCV 95.5 platelet 192 sodium 140 potassium 4.3 CO2 27.2 glucose 128 BUN 32 creatinine 1.12 calcium 9.2 [id number] Basic Metabolic Panel:  Recent Labs  06/03/16 1620  NA 135  K 4.3  CL 105  CO2 21*  GLUCOSE 102*  BUN 27*  CREATININE 1.05  CALCIUM 9.2   CBC:  Recent Labs  06/03/16 1620  WBC 5.5  HGB 12.7*  HCT 38.8*  MCV 95.1  PLT 168      Dg Knee Right Port  Result Date: 06/15/2016 CLINICAL DATA:  82 year old male status post knee arthroplasty. Osteoarthritis. Initial encounter. EXAM: PORTABLE RIGHT KNEE - 1-2 VIEW COMPARISON:  None. FINDINGS: Portable AP and cross-table lateral views of the right knee at 1613 hours. Medial hemi arthroplasty changes are  noted. Arthroplasty hardware appears intact and alignment appears within normal limits. Superimposed air-fluid level in the right knee joint. Superimposed severe medial and patellofemoral compartment degenerative changes with osteophytosis. No other acute osseous abnormality identified. IMPRESSION: 1. Right knee medial hemiarthroplasty changes with no adverse features. 2. Underlying advanced medial and patellofemoral compartment joint degeneration. Electronically Signed   By: Genevie Ann M.D.   On: 06/15/2016 16:32    ASSESSMENT/PLAN:  Unsteady gait - for home health PT and OT, for therapeutic strengthening exercises; fall precaution  Right knee osteoarthritis S/P right total knee arthroplasty - for home health PT and OT, for therapeutic strengthening exercises;  orthopedic surgeon follow-up; continue Xarelto 10 mg 1 tab by mouth daily 23 more days for DVT prophylaxis; continue Mobic 15 mg 1 tab by mouth daily, Norco 5/325 mg 1-2 tabs by mouth every 4 hours when necessary and Tylenol extra strength 500 mg 1 caplet by mouth every 6 hours when necessary for pain; baclofen 10 mg 1 tab by mouth every 8 hours when necessary for muscle spasm  GERD - continue Prilosec 20 mg 1 tab by mouth daily  Constipation - continue Colace 100 mg 1 capsule by mouth twice a day  Hypertension - well-controlled; continue Cozaar 100 mg 1 tab by mouth daily  BPH  - continue Flomax 0.4 mg 2 capsules = 0.8 mg by mouth daily  History of carotid endarterectomy - continue aspirin EC 81 mg 1 tab by mouth daily      I have filled out patient's discharge paperwork and written prescriptions.  Patient will receive home health PT and OT.  DME provided:  None  Total discharge time: Greater than 30 minutes Greater than 50% was spent in counseling and coordination of care with the patient.   Discharge time involved coordination of the discharge process with social worker, nursing staff and therapy department. Medical  justification for home health services verified.    Sheyanne Munley C. Silvis - NP [employer] [phone]

## 2016-06-26 DIAGNOSIS — Z7982 Long term (current) use of aspirin: Secondary | ICD-10-CM | POA: Diagnosis not present

## 2016-06-26 DIAGNOSIS — I10 Essential (primary) hypertension: Secondary | ICD-10-CM | POA: Diagnosis not present

## 2016-06-26 DIAGNOSIS — Z9181 History of falling: Secondary | ICD-10-CM | POA: Diagnosis not present

## 2016-06-26 DIAGNOSIS — Z471 Aftercare following joint replacement surgery: Secondary | ICD-10-CM | POA: Diagnosis not present

## 2016-06-26 DIAGNOSIS — Z96651 Presence of right artificial knee joint: Secondary | ICD-10-CM | POA: Diagnosis not present

## 2016-06-28 DIAGNOSIS — Z7982 Long term (current) use of aspirin: Secondary | ICD-10-CM | POA: Diagnosis not present

## 2016-06-28 DIAGNOSIS — Z471 Aftercare following joint replacement surgery: Secondary | ICD-10-CM | POA: Diagnosis not present

## 2016-06-28 DIAGNOSIS — Z96651 Presence of right artificial knee joint: Secondary | ICD-10-CM | POA: Diagnosis not present

## 2016-06-28 DIAGNOSIS — I10 Essential (primary) hypertension: Secondary | ICD-10-CM | POA: Diagnosis not present

## 2016-06-28 DIAGNOSIS — Z9181 History of falling: Secondary | ICD-10-CM | POA: Diagnosis not present

## 2016-06-29 DIAGNOSIS — I10 Essential (primary) hypertension: Secondary | ICD-10-CM | POA: Diagnosis not present

## 2016-06-29 DIAGNOSIS — Z9181 History of falling: Secondary | ICD-10-CM | POA: Diagnosis not present

## 2016-06-29 DIAGNOSIS — Z471 Aftercare following joint replacement surgery: Secondary | ICD-10-CM | POA: Diagnosis not present

## 2016-06-29 DIAGNOSIS — Z96651 Presence of right artificial knee joint: Secondary | ICD-10-CM | POA: Diagnosis not present

## 2016-06-29 DIAGNOSIS — Z7982 Long term (current) use of aspirin: Secondary | ICD-10-CM | POA: Diagnosis not present

## 2016-06-30 DIAGNOSIS — Z7982 Long term (current) use of aspirin: Secondary | ICD-10-CM | POA: Diagnosis not present

## 2016-06-30 DIAGNOSIS — Z96651 Presence of right artificial knee joint: Secondary | ICD-10-CM | POA: Diagnosis not present

## 2016-06-30 DIAGNOSIS — Z9181 History of falling: Secondary | ICD-10-CM | POA: Diagnosis not present

## 2016-06-30 DIAGNOSIS — Z471 Aftercare following joint replacement surgery: Secondary | ICD-10-CM | POA: Diagnosis not present

## 2016-06-30 DIAGNOSIS — I10 Essential (primary) hypertension: Secondary | ICD-10-CM | POA: Diagnosis not present

## 2016-07-01 DIAGNOSIS — I10 Essential (primary) hypertension: Secondary | ICD-10-CM | POA: Diagnosis not present

## 2016-07-01 DIAGNOSIS — Z471 Aftercare following joint replacement surgery: Secondary | ICD-10-CM | POA: Diagnosis not present

## 2016-07-01 DIAGNOSIS — Z9181 History of falling: Secondary | ICD-10-CM | POA: Diagnosis not present

## 2016-07-01 DIAGNOSIS — Z96651 Presence of right artificial knee joint: Secondary | ICD-10-CM | POA: Diagnosis not present

## 2016-07-01 DIAGNOSIS — Z7982 Long term (current) use of aspirin: Secondary | ICD-10-CM | POA: Diagnosis not present

## 2016-07-02 DIAGNOSIS — M1711 Unilateral primary osteoarthritis, right knee: Secondary | ICD-10-CM | POA: Diagnosis not present

## 2016-07-06 DIAGNOSIS — Z96651 Presence of right artificial knee joint: Secondary | ICD-10-CM | POA: Diagnosis not present

## 2016-07-06 DIAGNOSIS — Z7982 Long term (current) use of aspirin: Secondary | ICD-10-CM | POA: Diagnosis not present

## 2016-07-06 DIAGNOSIS — Z9181 History of falling: Secondary | ICD-10-CM | POA: Diagnosis not present

## 2016-07-06 DIAGNOSIS — Z471 Aftercare following joint replacement surgery: Secondary | ICD-10-CM | POA: Diagnosis not present

## 2016-07-06 DIAGNOSIS — I10 Essential (primary) hypertension: Secondary | ICD-10-CM | POA: Diagnosis not present

## 2016-07-07 DIAGNOSIS — Z471 Aftercare following joint replacement surgery: Secondary | ICD-10-CM | POA: Diagnosis not present

## 2016-07-07 DIAGNOSIS — Z9181 History of falling: Secondary | ICD-10-CM | POA: Diagnosis not present

## 2016-07-07 DIAGNOSIS — Z7982 Long term (current) use of aspirin: Secondary | ICD-10-CM | POA: Diagnosis not present

## 2016-07-07 DIAGNOSIS — I10 Essential (primary) hypertension: Secondary | ICD-10-CM | POA: Diagnosis not present

## 2016-07-07 DIAGNOSIS — Z96651 Presence of right artificial knee joint: Secondary | ICD-10-CM | POA: Diagnosis not present

## 2016-07-09 DIAGNOSIS — Z9181 History of falling: Secondary | ICD-10-CM | POA: Diagnosis not present

## 2016-07-09 DIAGNOSIS — Z96651 Presence of right artificial knee joint: Secondary | ICD-10-CM | POA: Diagnosis not present

## 2016-07-09 DIAGNOSIS — Z471 Aftercare following joint replacement surgery: Secondary | ICD-10-CM | POA: Diagnosis not present

## 2016-07-09 DIAGNOSIS — Z7982 Long term (current) use of aspirin: Secondary | ICD-10-CM | POA: Diagnosis not present

## 2016-07-09 DIAGNOSIS — I10 Essential (primary) hypertension: Secondary | ICD-10-CM | POA: Diagnosis not present

## 2016-07-10 DIAGNOSIS — Z9181 History of falling: Secondary | ICD-10-CM | POA: Diagnosis not present

## 2016-07-10 DIAGNOSIS — I10 Essential (primary) hypertension: Secondary | ICD-10-CM | POA: Diagnosis not present

## 2016-07-10 DIAGNOSIS — Z471 Aftercare following joint replacement surgery: Secondary | ICD-10-CM | POA: Diagnosis not present

## 2016-07-10 DIAGNOSIS — Z96651 Presence of right artificial knee joint: Secondary | ICD-10-CM | POA: Diagnosis not present

## 2016-07-10 DIAGNOSIS — Z7982 Long term (current) use of aspirin: Secondary | ICD-10-CM | POA: Diagnosis not present

## 2016-07-13 DIAGNOSIS — I10 Essential (primary) hypertension: Secondary | ICD-10-CM | POA: Diagnosis not present

## 2016-07-13 DIAGNOSIS — Z7982 Long term (current) use of aspirin: Secondary | ICD-10-CM | POA: Diagnosis not present

## 2016-07-13 DIAGNOSIS — Z9181 History of falling: Secondary | ICD-10-CM | POA: Diagnosis not present

## 2016-07-13 DIAGNOSIS — Z96651 Presence of right artificial knee joint: Secondary | ICD-10-CM | POA: Diagnosis not present

## 2016-07-13 DIAGNOSIS — Z471 Aftercare following joint replacement surgery: Secondary | ICD-10-CM | POA: Diagnosis not present

## 2016-07-14 DIAGNOSIS — I10 Essential (primary) hypertension: Secondary | ICD-10-CM | POA: Diagnosis not present

## 2016-07-14 DIAGNOSIS — Z9181 History of falling: Secondary | ICD-10-CM | POA: Diagnosis not present

## 2016-07-14 DIAGNOSIS — Z471 Aftercare following joint replacement surgery: Secondary | ICD-10-CM | POA: Diagnosis not present

## 2016-07-14 DIAGNOSIS — Z7982 Long term (current) use of aspirin: Secondary | ICD-10-CM | POA: Diagnosis not present

## 2016-07-14 DIAGNOSIS — Z96651 Presence of right artificial knee joint: Secondary | ICD-10-CM | POA: Diagnosis not present

## 2016-07-15 DIAGNOSIS — Z471 Aftercare following joint replacement surgery: Secondary | ICD-10-CM | POA: Diagnosis not present

## 2016-07-15 DIAGNOSIS — Z7982 Long term (current) use of aspirin: Secondary | ICD-10-CM | POA: Diagnosis not present

## 2016-07-15 DIAGNOSIS — Z9181 History of falling: Secondary | ICD-10-CM | POA: Diagnosis not present

## 2016-07-15 DIAGNOSIS — I10 Essential (primary) hypertension: Secondary | ICD-10-CM | POA: Diagnosis not present

## 2016-07-15 DIAGNOSIS — Z96651 Presence of right artificial knee joint: Secondary | ICD-10-CM | POA: Diagnosis not present

## 2016-07-16 DIAGNOSIS — Z96651 Presence of right artificial knee joint: Secondary | ICD-10-CM | POA: Diagnosis not present

## 2016-07-16 DIAGNOSIS — Z9181 History of falling: Secondary | ICD-10-CM | POA: Diagnosis not present

## 2016-07-16 DIAGNOSIS — Z7982 Long term (current) use of aspirin: Secondary | ICD-10-CM | POA: Diagnosis not present

## 2016-07-16 DIAGNOSIS — I10 Essential (primary) hypertension: Secondary | ICD-10-CM | POA: Diagnosis not present

## 2016-07-16 DIAGNOSIS — Z471 Aftercare following joint replacement surgery: Secondary | ICD-10-CM | POA: Diagnosis not present

## 2016-07-20 DIAGNOSIS — Z7982 Long term (current) use of aspirin: Secondary | ICD-10-CM | POA: Diagnosis not present

## 2016-07-20 DIAGNOSIS — I10 Essential (primary) hypertension: Secondary | ICD-10-CM | POA: Diagnosis not present

## 2016-07-20 DIAGNOSIS — Z9181 History of falling: Secondary | ICD-10-CM | POA: Diagnosis not present

## 2016-07-20 DIAGNOSIS — Z471 Aftercare following joint replacement surgery: Secondary | ICD-10-CM | POA: Diagnosis not present

## 2016-07-20 DIAGNOSIS — Z96651 Presence of right artificial knee joint: Secondary | ICD-10-CM | POA: Diagnosis not present

## 2016-07-22 DIAGNOSIS — Z7982 Long term (current) use of aspirin: Secondary | ICD-10-CM | POA: Diagnosis not present

## 2016-07-22 DIAGNOSIS — Z9181 History of falling: Secondary | ICD-10-CM | POA: Diagnosis not present

## 2016-07-22 DIAGNOSIS — Z471 Aftercare following joint replacement surgery: Secondary | ICD-10-CM | POA: Diagnosis not present

## 2016-07-22 DIAGNOSIS — I10 Essential (primary) hypertension: Secondary | ICD-10-CM | POA: Diagnosis not present

## 2016-07-22 DIAGNOSIS — Z96651 Presence of right artificial knee joint: Secondary | ICD-10-CM | POA: Diagnosis not present

## 2016-07-23 DIAGNOSIS — I10 Essential (primary) hypertension: Secondary | ICD-10-CM | POA: Diagnosis not present

## 2016-07-23 DIAGNOSIS — M1711 Unilateral primary osteoarthritis, right knee: Secondary | ICD-10-CM | POA: Diagnosis not present

## 2016-07-23 DIAGNOSIS — Z7982 Long term (current) use of aspirin: Secondary | ICD-10-CM | POA: Diagnosis not present

## 2016-07-23 DIAGNOSIS — Z471 Aftercare following joint replacement surgery: Secondary | ICD-10-CM | POA: Diagnosis not present

## 2016-07-23 DIAGNOSIS — Z96651 Presence of right artificial knee joint: Secondary | ICD-10-CM | POA: Diagnosis not present

## 2016-07-23 DIAGNOSIS — Z9181 History of falling: Secondary | ICD-10-CM | POA: Diagnosis not present

## 2016-07-25 ENCOUNTER — Other Ambulatory Visit: Payer: Self-pay | Admitting: Internal Medicine

## 2016-07-26 ENCOUNTER — Ambulatory Visit (HOSPITAL_COMMUNITY)
Admission: RE | Admit: 2016-07-26 | Discharge: 2016-07-26 | Disposition: A | Discharge status: Home / Self Care | Payer: Medicare Other | Source: Ambulatory Visit | Attending: Family Medicine | Admitting: Family Medicine

## 2016-07-26 ENCOUNTER — Other Ambulatory Visit (HOSPITAL_COMMUNITY): Payer: Self-pay | Admitting: Family Medicine

## 2016-07-26 DIAGNOSIS — M7989 Other specified soft tissue disorders: Secondary | ICD-10-CM | POA: Diagnosis not present

## 2016-07-26 DIAGNOSIS — M79661 Pain in right lower leg: Secondary | ICD-10-CM

## 2016-07-26 NOTE — Progress Notes (Signed)
VASCULAR LAB PRELIMINARY  PRELIMINARY  PRELIMINARY  PRELIMINARY  Right lower extremity venous duplex completed.    Preliminary report:  Right:  No evidence of DVT, superficial thrombosis, or Baker's cyst.  Jyden Kromer, RVS 07/26/2016, 4:42 PM

## 2016-08-09 DIAGNOSIS — R2689 Other abnormalities of gait and mobility: Secondary | ICD-10-CM | POA: Diagnosis not present

## 2016-08-09 DIAGNOSIS — Z96651 Presence of right artificial knee joint: Secondary | ICD-10-CM | POA: Diagnosis not present

## 2016-08-09 DIAGNOSIS — Z471 Aftercare following joint replacement surgery: Secondary | ICD-10-CM | POA: Diagnosis not present

## 2016-08-09 DIAGNOSIS — M1711 Unilateral primary osteoarthritis, right knee: Secondary | ICD-10-CM | POA: Diagnosis not present

## 2016-08-09 DIAGNOSIS — M6281 Muscle weakness (generalized): Secondary | ICD-10-CM | POA: Diagnosis not present

## 2016-08-10 DIAGNOSIS — M1711 Unilateral primary osteoarthritis, right knee: Secondary | ICD-10-CM | POA: Diagnosis not present

## 2016-08-10 DIAGNOSIS — Z471 Aftercare following joint replacement surgery: Secondary | ICD-10-CM | POA: Diagnosis not present

## 2016-08-10 DIAGNOSIS — Z96651 Presence of right artificial knee joint: Secondary | ICD-10-CM | POA: Diagnosis not present

## 2016-08-10 DIAGNOSIS — M6281 Muscle weakness (generalized): Secondary | ICD-10-CM | POA: Diagnosis not present

## 2016-08-10 DIAGNOSIS — R2689 Other abnormalities of gait and mobility: Secondary | ICD-10-CM | POA: Diagnosis not present

## 2016-08-11 DIAGNOSIS — M1711 Unilateral primary osteoarthritis, right knee: Secondary | ICD-10-CM | POA: Diagnosis not present

## 2016-08-11 DIAGNOSIS — R2689 Other abnormalities of gait and mobility: Secondary | ICD-10-CM | POA: Diagnosis not present

## 2016-08-11 DIAGNOSIS — Z471 Aftercare following joint replacement surgery: Secondary | ICD-10-CM | POA: Diagnosis not present

## 2016-08-11 DIAGNOSIS — Z96651 Presence of right artificial knee joint: Secondary | ICD-10-CM | POA: Diagnosis not present

## 2016-08-11 DIAGNOSIS — M6281 Muscle weakness (generalized): Secondary | ICD-10-CM | POA: Diagnosis not present

## 2016-08-12 DIAGNOSIS — Z96651 Presence of right artificial knee joint: Secondary | ICD-10-CM | POA: Diagnosis not present

## 2016-08-12 DIAGNOSIS — R2689 Other abnormalities of gait and mobility: Secondary | ICD-10-CM | POA: Diagnosis not present

## 2016-08-12 DIAGNOSIS — M1711 Unilateral primary osteoarthritis, right knee: Secondary | ICD-10-CM | POA: Diagnosis not present

## 2016-08-12 DIAGNOSIS — M6281 Muscle weakness (generalized): Secondary | ICD-10-CM | POA: Diagnosis not present

## 2016-08-12 DIAGNOSIS — Z471 Aftercare following joint replacement surgery: Secondary | ICD-10-CM | POA: Diagnosis not present

## 2016-08-14 DIAGNOSIS — Z471 Aftercare following joint replacement surgery: Secondary | ICD-10-CM | POA: Diagnosis not present

## 2016-08-14 DIAGNOSIS — M1711 Unilateral primary osteoarthritis, right knee: Secondary | ICD-10-CM | POA: Diagnosis not present

## 2016-08-14 DIAGNOSIS — M6281 Muscle weakness (generalized): Secondary | ICD-10-CM | POA: Diagnosis not present

## 2016-08-14 DIAGNOSIS — R2689 Other abnormalities of gait and mobility: Secondary | ICD-10-CM | POA: Diagnosis not present

## 2016-08-14 DIAGNOSIS — Z96651 Presence of right artificial knee joint: Secondary | ICD-10-CM | POA: Diagnosis not present

## 2016-08-16 DIAGNOSIS — M6281 Muscle weakness (generalized): Secondary | ICD-10-CM | POA: Diagnosis not present

## 2016-08-16 DIAGNOSIS — Z96651 Presence of right artificial knee joint: Secondary | ICD-10-CM | POA: Diagnosis not present

## 2016-08-16 DIAGNOSIS — R2689 Other abnormalities of gait and mobility: Secondary | ICD-10-CM | POA: Diagnosis not present

## 2016-08-16 DIAGNOSIS — Z471 Aftercare following joint replacement surgery: Secondary | ICD-10-CM | POA: Diagnosis not present

## 2016-08-16 DIAGNOSIS — M1711 Unilateral primary osteoarthritis, right knee: Secondary | ICD-10-CM | POA: Diagnosis not present

## 2016-08-17 DIAGNOSIS — Z471 Aftercare following joint replacement surgery: Secondary | ICD-10-CM | POA: Diagnosis not present

## 2016-08-17 DIAGNOSIS — M6281 Muscle weakness (generalized): Secondary | ICD-10-CM | POA: Diagnosis not present

## 2016-08-17 DIAGNOSIS — M1711 Unilateral primary osteoarthritis, right knee: Secondary | ICD-10-CM | POA: Diagnosis not present

## 2016-08-17 DIAGNOSIS — R2689 Other abnormalities of gait and mobility: Secondary | ICD-10-CM | POA: Diagnosis not present

## 2016-08-17 DIAGNOSIS — Z96651 Presence of right artificial knee joint: Secondary | ICD-10-CM | POA: Diagnosis not present

## 2016-08-19 ENCOUNTER — Emergency Department (HOSPITAL_BASED_OUTPATIENT_CLINIC_OR_DEPARTMENT_OTHER): Admit: 2016-08-19 | Discharge: 2016-08-19 | Disposition: A | Discharge status: Home / Self Care | Payer: Medicare Other

## 2016-08-19 ENCOUNTER — Emergency Department (HOSPITAL_COMMUNITY)
Admission: EM | Admit: 2016-08-19 | Discharge: 2016-08-19 | Disposition: A | Discharge status: Home / Self Care | Payer: Medicare Other | Attending: Emergency Medicine | Admitting: Emergency Medicine

## 2016-08-19 ENCOUNTER — Encounter (HOSPITAL_COMMUNITY): Payer: Self-pay | Admitting: Emergency Medicine

## 2016-08-19 ENCOUNTER — Emergency Department (HOSPITAL_COMMUNITY): Payer: Medicare Other

## 2016-08-19 DIAGNOSIS — M7989 Other specified soft tissue disorders: Secondary | ICD-10-CM | POA: Diagnosis not present

## 2016-08-19 DIAGNOSIS — Z7982 Long term (current) use of aspirin: Secondary | ICD-10-CM | POA: Diagnosis not present

## 2016-08-19 DIAGNOSIS — F0390 Unspecified dementia without behavioral disturbance: Secondary | ICD-10-CM | POA: Diagnosis not present

## 2016-08-19 DIAGNOSIS — I10 Essential (primary) hypertension: Secondary | ICD-10-CM | POA: Diagnosis not present

## 2016-08-19 DIAGNOSIS — M1990 Unspecified osteoarthritis, unspecified site: Secondary | ICD-10-CM | POA: Diagnosis not present

## 2016-08-19 DIAGNOSIS — G894 Chronic pain syndrome: Secondary | ICD-10-CM | POA: Diagnosis not present

## 2016-08-19 DIAGNOSIS — M79606 Pain in leg, unspecified: Secondary | ICD-10-CM

## 2016-08-19 DIAGNOSIS — M79661 Pain in right lower leg: Secondary | ICD-10-CM | POA: Diagnosis not present

## 2016-08-19 DIAGNOSIS — R2689 Other abnormalities of gait and mobility: Secondary | ICD-10-CM | POA: Diagnosis not present

## 2016-08-19 DIAGNOSIS — Z96651 Presence of right artificial knee joint: Secondary | ICD-10-CM | POA: Diagnosis not present

## 2016-08-19 DIAGNOSIS — Z7901 Long term (current) use of anticoagulants: Secondary | ICD-10-CM | POA: Diagnosis not present

## 2016-08-19 DIAGNOSIS — Z471 Aftercare following joint replacement surgery: Secondary | ICD-10-CM | POA: Diagnosis not present

## 2016-08-19 DIAGNOSIS — M1711 Unilateral primary osteoarthritis, right knee: Secondary | ICD-10-CM | POA: Diagnosis not present

## 2016-08-19 DIAGNOSIS — M6281 Muscle weakness (generalized): Secondary | ICD-10-CM | POA: Diagnosis not present

## 2016-08-19 DIAGNOSIS — M25569 Pain in unspecified knee: Secondary | ICD-10-CM | POA: Diagnosis not present

## 2016-08-19 DIAGNOSIS — Z7409 Other reduced mobility: Secondary | ICD-10-CM | POA: Diagnosis not present

## 2016-08-19 DIAGNOSIS — M79604 Pain in right leg: Secondary | ICD-10-CM | POA: Diagnosis not present

## 2016-08-19 DIAGNOSIS — Z87891 Personal history of nicotine dependence: Secondary | ICD-10-CM | POA: Insufficient documentation

## 2016-08-19 LAB — BASIC METABOLIC PANEL
ANION GAP: 6 (ref 5–15)
BUN: 18 mg/dL (ref 6–20)
CALCIUM: 8.9 mg/dL (ref 8.9–10.3)
CHLORIDE: 103 mmol/L (ref 101–111)
CO2: 28 mmol/L (ref 22–32)
Creatinine, Ser: 0.99 mg/dL (ref 0.61–1.24)
GFR calc non Af Amer: >60 mL/min (ref >60)
Glucose, Bld: 119 mg/dL — ABNORMAL HIGH (ref 65–99)
Potassium: 3.7 mmol/L (ref 3.5–5.1)
Sodium: 137 mmol/L (ref 135–145)

## 2016-08-19 LAB — CBC WITH DIFFERENTIAL/PLATELET
BASOS ABS: 0 10*3/uL (ref 0.0–0.1)
BASOS PCT: 1 %
Eosinophils Absolute: 0.4 10*3/uL (ref 0.0–0.7)
Eosinophils Relative: 7 %
HEMATOCRIT: 35.4 % — AB (ref 39.0–52.0)
HEMOGLOBIN: 11.7 g/dL — AB (ref 13.0–17.0)
Lymphocytes Relative: 19 %
Lymphs Abs: 1.2 10*3/uL (ref 0.7–4.0)
MCH: 30.7 pg (ref 26.0–34.0)
MCHC: 33.1 g/dL (ref 30.0–36.0)
MCV: 92.9 fL (ref 78.0–100.0)
Monocytes Absolute: 0.4 10*3/uL (ref 0.1–1.0)
Monocytes Relative: 7 %
NEUTROS PCT: 66 %
Neutro Abs: 4.1 10*3/uL (ref 1.7–7.7)
Platelets: 204 10*3/uL (ref 150–400)
RBC: 3.81 MIL/uL — ABNORMAL LOW (ref 4.22–5.81)
RDW: 13.4 % (ref 11.5–15.5)
WBC: 6.1 10*3/uL (ref 4.0–10.5)

## 2016-08-19 MED ORDER — CLOBETASOL PROPIONATE 0.05 % EX CREA: 1.0000 "application " | TOPICAL_CREAM | Freq: Two times a day (BID) | CUTANEOUS | 0 refills | Status: DC

## 2016-08-19 MED ORDER — CEPHALEXIN 500 MG PO CAPS: 500.0000 mg | ORAL_CAPSULE | Freq: Three times a day (TID) | ORAL | 0 refills | Status: DC

## 2016-08-19 NOTE — ED Provider Notes (Addendum)
[facility] DEPT Provider Note   CSN: [id number] Arrival date & time: 08/19/16  1139     History   Chief Complaint Chief Complaint  Patient presents with  . Leg Pain  . Leg Swelling    HPI William Skinner is a 83 y.o. male.  he presents after transfer from his care facility of lower extremity swelling. Had a fall a few weeks ago. Has had some swelling in his right leg since that time. At one point diagnosed with cellulitis. Had a negative Doppler 2 weeks ago. Family wanted further evaluation because of persistent swelling and presents here. No fever. No chest pain shortness of breath or dyspnea.  HPI  Past Medical History:  Diagnosis Date  . Back pain   . Carotid artery occlusion   . Hyperlipidemia   . Hypertension   . Primary osteoarthritis of knee    right  . Right leg pain     Patient Active Problem List   Diagnosis Date Noted  . H/O carotid endarterectomy 06/01/2016  . Primary osteoarthritis of right knee 05/31/2016  . Essential hypertension 05/31/2016  . H/O spinal stenosis 05/31/2016  . Pain in joint, lower leg 03/06/2014    Past Surgical History:  Procedure Laterality Date  . CAROTID ENDARTERECTOMY Left   . CATARACT EXTRACTION Bilateral   . COLONOSCOPY W/ BIOPSIES AND POLYPECTOMY    . HERNIA REPAIR    . KNEE SURGERY Left   . NOSE SURGERY    . TONSILLECTOMY    . TOTAL KNEE ARTHROPLASTY Right 06/15/2016   Procedure: RIGHT TOTAL KNEE ARTHROPLASTY;  Surgeon: Renette Butters, MD;  Location: [facility];  Service: Orthopedics;  Laterality: Right;       Home Medications    Prior to Admission medications   Medication Sig Start Date End Date Taking? Authorizing Provider  acetaminophen (TYLENOL) 500 MG tablet Take 500 mg by mouth every 6 (six) hours as needed for moderate pain or headache. Reported on 07/29/2015    Historical Provider, MD  aspirin 81 MG tablet Take 81 mg by mouth daily.     Historical Provider, MD  baclofen (LIORESAL) 10 MG tablet Take 1 tablet  (10 mg total) by mouth 3 (three) times daily as needed for muscle spasms. 06/15/16   Charna Elizabeth Martensen III, PA-C  cephALEXin (KEFLEX) 500 MG capsule Take 1 capsule (500 mg total) by mouth 3 (three) times daily. 08/19/16   Tanna Furry, MD  cholecalciferol (VITAMIN D) 1000 units tablet Take 1,000 Units by mouth daily.    Historical Provider, MD  clobetasol cream (TEMOVATE) [id number] % Apply 1 application topically 2 (two) times daily. Apply daily or bid to Bilteral lower extremity rash. 08/19/16   Tanna Furry, MD  co-enzyme Q-10 50 MG capsule Take 100 mg by mouth daily.    Historical Provider, MD  docusate sodium (COLACE) 100 MG capsule Take 1 capsule (100 mg total) by mouth 2 (two) times daily. 06/15/16   Charna Elizabeth Martensen III, PA-C  HYDROcodone-acetaminophen (NORCO) 5-325 MG tablet Take 1-2 tablets by mouth every 4 (four) hours as needed for moderate pain. 06/15/16   Charna Elizabeth Martensen III, PA-C  losartan (COZAAR) 100 MG tablet Take 100 mg by mouth daily.    Historical Provider, MD  meloxicam (MOBIC) 15 MG tablet Take 15 mg by mouth daily.  04/29/16   Historical Provider, MD  omeprazole (PRILOSEC) 20 MG capsule Take 20 mg by mouth daily.  05/31/16   Historical Provider, MD  ondansetron (ZOFRAN) 4  MG tablet Take 1 tablet (4 mg total) by mouth every 8 (eight) hours as needed for nausea or vomiting. 06/15/16   Charna Elizabeth Martensen III, PA-C  rivaroxaban (XARELTO) 10 MG TABS tablet Take 1 tablet (10 mg total) by mouth daily. 06/15/16   Charna Elizabeth Martensen III, PA-C  selenium 50 MCG TABS tablet Take 50 mcg by mouth daily.    Historical Provider, MD  tamsulosin (FLOMAX) 0.4 MG CAPS capsule Take 0.8 mg by mouth daily.  03/12/16   Historical Provider, MD  vitamin C (ASCORBIC ACID) 500 MG tablet Take 500 mg by mouth daily.    Historical Provider, MD  vitamin E 200 UNIT capsule Take 200 Units by mouth daily.    Historical Provider, MD  Zinc 30 MG CAPS Take 30 mg by mouth daily.    Historical Provider,  MD    Family History History reviewed. No pertinent family history.  Social History Social History  Substance Use Topics  . Smoking status: Former Smoker    Quit date: 03/06/1964  . Smokeless tobacco: Never Used  . Alcohol use Yes     Comment: occasional beer     Allergies   Patient has no known allergies.   Review of Systems Review of Systems  Constitutional: Negative for appetite change, chills, diaphoresis, fatigue and fever.  HENT: Negative for mouth sores, sore throat and trouble swallowing.   Eyes: Negative for visual disturbance.  Respiratory: Negative for cough, chest tightness, shortness of breath and wheezing.   Cardiovascular: Negative for chest pain.  Gastrointestinal: Negative for abdominal distention, abdominal pain, diarrhea, nausea and vomiting.  Endocrine: Negative for polydipsia, polyphagia and polyuria.  Genitourinary: Negative for dysuria, frequency and hematuria.  Musculoskeletal: Positive for arthralgias and myalgias. Negative for gait problem.       Right leg pain and swelling  Skin: Negative for color change, pallor and rash.  Neurological: Negative for dizziness, syncope, light-headedness and headaches.  Hematological: Does not bruise/bleed easily.  Psychiatric/Behavioral: Negative for behavioral problems and confusion.     Physical Exam Updated Vital Signs BP 137/78 (BP Location: Left Arm)   Pulse 79   Temp 97.7 F (36.5 C) (Oral)   Resp 18   SpO2 96%   Physical Exam  Constitutional: He is oriented to person, place, and time. He appears well-developed and well-nourished. No distress.  HENT:  Head: Normocephalic.  Eyes: Conjunctivae are normal. Pupils are equal, round, and reactive to light. No scleral icterus.  Neck: Normal range of motion. Neck supple. No thyromegaly present.  Cardiovascular: Normal rate and regular rhythm.  Exam reveals no gallop and no friction rub.   No murmur heard. Pulmonary/Chest: Effort normal and breath sounds  normal. No respiratory distress. He has no wheezes. He has no rales.  Abdominal: Soft. Bowel sounds are normal. He exhibits no distension. There is no tenderness. There is no rebound.  Musculoskeletal:  Well-healed right total knee arthroplasty incision. No palpable joint effusion. Incision appears well healed. Has 1+ edema to the entire lower external ear from the knee down.  Salicylate operative fossa,? Bakers cyst. No palpable cording. He has diffuse rash of the bilateral extremities consistent with his chronic dermatitis. Some erythema but no frank organized cellulitis. No ankle effusion.  Neurological: He is alert and oriented to person, place, and time.  Skin: Skin is warm and dry. No rash noted.  symmetric bilateral extremity edema. He complains of discomfort in the right leg. No palpable cording. Minimal erythema without cellulitis.  Psychiatric: He  has a normal mood and affect. His behavior is normal.     ED Treatments / Results  Labs (all labs ordered are listed, but only abnormal results are displayed) Labs Reviewed  CBC WITH DIFFERENTIAL/PLATELET - Abnormal; Notable for the following:       Result Value   RBC 3.81 (*)    Hemoglobin 11.7 (*)    HCT 35.4 (*)    All other components within normal limits  BASIC METABOLIC PANEL - Abnormal; Notable for the following:    Glucose, Bld 119 (*)    All other components within normal limits    EKG  EKG Interpretation None       Radiology Dg Tibia/fibula Right  Result Date: 08/19/2016 CLINICAL DATA:  Right lower extremity pain after fall 2 weeks prior. EXAM: RIGHT TIBIA AND FIBULA - 2 VIEW COMPARISON:  06/15/2016 right knee radiographs FINDINGS: Re- demonstration of medial compartment right knee hemiarthroplasty, with no evidence of hardware fracture or loosening. No acute osseous fracture. No dislocation at the right knee or right ankle. No suspicious focal osseous lesion. Moderate lateral compartment and patellofemoral  compartment right knee osteoarthritis. IMPRESSION: 1. No osseous fracture . 2. Medial compartment right knee hemiarthroplasty, with no evidence of hardware complication. Electronically Signed   By: Ilona Sorrel M.D.   On: 08/19/2016 12:38    Procedures Procedures (including critical care time)  Medications Ordered in ED Medications - No data to display   Initial Impression / Assessment and Plan / ED Course  I have reviewed the triage vital signs and the nursing notes.  Pertinent labs & imaging results that were available during my care of the patient were reviewed by me and considered in my medical decision making (see chart for details).     Patient describes a bit of a twisting injury in the shower when this started a few weeks ago. X-rays were obtained hardware appears intact lower external result otherwise normal no fracture. Normal ultrasound. No leukocytosis.  Discussion this may be continued postoperative swelling causing some discomfort. No obvious cellulitis but with erythema and swelling and hardware will cover him with 7 days Keflex. I recommended by I compression hose and primary care follow-up.  Final Clinical Impressions(s) / ED Diagnoses   Final diagnoses:  Leg pain  Leg swelling    New Prescriptions New Prescriptions   CEPHALEXIN (KEFLEX) 500 MG CAPSULE    Take 1 capsule (500 mg total) by mouth 3 (three) times daily.   CLOBETASOL CREAM (TEMOVATE) 0.05 %    Apply 1 application topically 2 (two) times daily. Apply daily or bid to Bilteral lower extremity rash.     Tanna Furry, MD 08/19/16 [place], MD 09/05/16 [phone]

## 2016-08-19 NOTE — ED Notes (Signed)
Report called to [facility] at [facility]

## 2016-08-19 NOTE — Discharge Instructions (Signed)
Thigh high compression hose--wear daily to help decrease leg sweling.  Steroid cream--apply EVERY day, or twice per day to rash.  Recheck with Dr. Percell Miller or Dr. Harrington Challenger if not improving

## 2016-08-19 NOTE — ED Notes (Signed)
[facility] called  

## 2016-08-19 NOTE — Progress Notes (Signed)
**  Preliminary report by tech**  Right lower extremity venous duplex complete. There is no evidence of deep or superficial vein thrombosis involving the right lower extremity. All visualized vessels appear patent and compressible. There is no evidence of a Baker's cyst on the right.  Incidental findings are consistent with: enlarged lymph node on the right measuring 0.8 cm x 2 cm x 3.2 cm. Results were given to Dr. Jeneen Rinks.   08/19/16 1:03 PM William Skinner RVT

## 2016-08-19 NOTE — ED Triage Notes (Addendum)
Patient here from [facility] with complaints of right leg pain and swelling unable to bear weight. Redness. States that he is here for DVT evaluation. Pulses palpable. Doppler two weeks ago. Positive cellulitis.

## 2016-08-19 NOTE — ED Notes (Signed)
Bed: WA23 Expected date:  Expected time:  Means of arrival:  Comments: 

## 2016-08-20 DIAGNOSIS — M6281 Muscle weakness (generalized): Secondary | ICD-10-CM | POA: Diagnosis not present

## 2016-08-20 DIAGNOSIS — R2689 Other abnormalities of gait and mobility: Secondary | ICD-10-CM | POA: Diagnosis not present

## 2016-08-20 DIAGNOSIS — M1711 Unilateral primary osteoarthritis, right knee: Secondary | ICD-10-CM | POA: Diagnosis not present

## 2016-08-20 DIAGNOSIS — Z96651 Presence of right artificial knee joint: Secondary | ICD-10-CM | POA: Diagnosis not present

## 2016-08-20 DIAGNOSIS — Z471 Aftercare following joint replacement surgery: Secondary | ICD-10-CM | POA: Diagnosis not present

## 2016-08-23 DIAGNOSIS — M6281 Muscle weakness (generalized): Secondary | ICD-10-CM | POA: Diagnosis not present

## 2016-08-23 DIAGNOSIS — M1711 Unilateral primary osteoarthritis, right knee: Secondary | ICD-10-CM | POA: Diagnosis not present

## 2016-08-23 DIAGNOSIS — R2689 Other abnormalities of gait and mobility: Secondary | ICD-10-CM | POA: Diagnosis not present

## 2016-08-23 DIAGNOSIS — Z471 Aftercare following joint replacement surgery: Secondary | ICD-10-CM | POA: Diagnosis not present

## 2016-08-23 DIAGNOSIS — Z96651 Presence of right artificial knee joint: Secondary | ICD-10-CM | POA: Diagnosis not present

## 2016-08-25 DIAGNOSIS — M1711 Unilateral primary osteoarthritis, right knee: Secondary | ICD-10-CM | POA: Diagnosis not present

## 2016-08-25 DIAGNOSIS — Z96651 Presence of right artificial knee joint: Secondary | ICD-10-CM | POA: Diagnosis not present

## 2016-08-25 DIAGNOSIS — Z471 Aftercare following joint replacement surgery: Secondary | ICD-10-CM | POA: Diagnosis not present

## 2016-08-25 DIAGNOSIS — M6281 Muscle weakness (generalized): Secondary | ICD-10-CM | POA: Diagnosis not present

## 2016-08-25 DIAGNOSIS — R2689 Other abnormalities of gait and mobility: Secondary | ICD-10-CM | POA: Diagnosis not present

## 2016-08-26 DIAGNOSIS — R2689 Other abnormalities of gait and mobility: Secondary | ICD-10-CM | POA: Diagnosis not present

## 2016-08-26 DIAGNOSIS — Z96651 Presence of right artificial knee joint: Secondary | ICD-10-CM | POA: Diagnosis not present

## 2016-08-26 DIAGNOSIS — Z471 Aftercare following joint replacement surgery: Secondary | ICD-10-CM | POA: Diagnosis not present

## 2016-08-26 DIAGNOSIS — M1711 Unilateral primary osteoarthritis, right knee: Secondary | ICD-10-CM | POA: Diagnosis not present

## 2016-08-26 DIAGNOSIS — M6281 Muscle weakness (generalized): Secondary | ICD-10-CM | POA: Diagnosis not present

## 2016-08-27 DIAGNOSIS — Z471 Aftercare following joint replacement surgery: Secondary | ICD-10-CM | POA: Diagnosis not present

## 2016-08-27 DIAGNOSIS — M6281 Muscle weakness (generalized): Secondary | ICD-10-CM | POA: Diagnosis not present

## 2016-08-27 DIAGNOSIS — M1711 Unilateral primary osteoarthritis, right knee: Secondary | ICD-10-CM | POA: Diagnosis not present

## 2016-08-27 DIAGNOSIS — R2689 Other abnormalities of gait and mobility: Secondary | ICD-10-CM | POA: Diagnosis not present

## 2016-08-27 DIAGNOSIS — Z96651 Presence of right artificial knee joint: Secondary | ICD-10-CM | POA: Diagnosis not present

## 2016-08-30 DIAGNOSIS — M1711 Unilateral primary osteoarthritis, right knee: Secondary | ICD-10-CM | POA: Diagnosis not present

## 2016-08-30 DIAGNOSIS — Z471 Aftercare following joint replacement surgery: Secondary | ICD-10-CM | POA: Diagnosis not present

## 2016-08-30 DIAGNOSIS — R2689 Other abnormalities of gait and mobility: Secondary | ICD-10-CM | POA: Diagnosis not present

## 2016-08-30 DIAGNOSIS — M6281 Muscle weakness (generalized): Secondary | ICD-10-CM | POA: Diagnosis not present

## 2016-08-30 DIAGNOSIS — Z96651 Presence of right artificial knee joint: Secondary | ICD-10-CM | POA: Diagnosis not present

## 2016-09-01 DIAGNOSIS — Z471 Aftercare following joint replacement surgery: Secondary | ICD-10-CM | POA: Diagnosis not present

## 2016-09-01 DIAGNOSIS — Z96651 Presence of right artificial knee joint: Secondary | ICD-10-CM | POA: Diagnosis not present

## 2016-09-01 DIAGNOSIS — M1711 Unilateral primary osteoarthritis, right knee: Secondary | ICD-10-CM | POA: Diagnosis not present

## 2016-09-01 DIAGNOSIS — R2689 Other abnormalities of gait and mobility: Secondary | ICD-10-CM | POA: Diagnosis not present

## 2016-09-01 DIAGNOSIS — M6281 Muscle weakness (generalized): Secondary | ICD-10-CM | POA: Diagnosis not present

## 2016-09-02 DIAGNOSIS — Z96651 Presence of right artificial knee joint: Secondary | ICD-10-CM | POA: Diagnosis not present

## 2016-09-02 DIAGNOSIS — M1711 Unilateral primary osteoarthritis, right knee: Secondary | ICD-10-CM | POA: Diagnosis not present

## 2016-09-02 DIAGNOSIS — M6281 Muscle weakness (generalized): Secondary | ICD-10-CM | POA: Diagnosis not present

## 2016-09-02 DIAGNOSIS — R2689 Other abnormalities of gait and mobility: Secondary | ICD-10-CM | POA: Diagnosis not present

## 2016-09-02 DIAGNOSIS — Z471 Aftercare following joint replacement surgery: Secondary | ICD-10-CM | POA: Diagnosis not present

## 2016-09-03 DIAGNOSIS — M6281 Muscle weakness (generalized): Secondary | ICD-10-CM | POA: Diagnosis not present

## 2016-09-03 DIAGNOSIS — R2689 Other abnormalities of gait and mobility: Secondary | ICD-10-CM | POA: Diagnosis not present

## 2016-09-03 DIAGNOSIS — Z96651 Presence of right artificial knee joint: Secondary | ICD-10-CM | POA: Diagnosis not present

## 2016-09-03 DIAGNOSIS — Z471 Aftercare following joint replacement surgery: Secondary | ICD-10-CM | POA: Diagnosis not present

## 2016-09-03 DIAGNOSIS — M1711 Unilateral primary osteoarthritis, right knee: Secondary | ICD-10-CM | POA: Diagnosis not present

## 2016-09-06 DIAGNOSIS — R2689 Other abnormalities of gait and mobility: Secondary | ICD-10-CM | POA: Diagnosis not present

## 2016-09-06 DIAGNOSIS — M6281 Muscle weakness (generalized): Secondary | ICD-10-CM | POA: Diagnosis not present

## 2016-09-06 DIAGNOSIS — Z96651 Presence of right artificial knee joint: Secondary | ICD-10-CM | POA: Diagnosis not present

## 2016-09-06 DIAGNOSIS — Z471 Aftercare following joint replacement surgery: Secondary | ICD-10-CM | POA: Diagnosis not present

## 2016-09-06 DIAGNOSIS — M1711 Unilateral primary osteoarthritis, right knee: Secondary | ICD-10-CM | POA: Diagnosis not present

## 2016-09-07 DIAGNOSIS — Z471 Aftercare following joint replacement surgery: Secondary | ICD-10-CM | POA: Diagnosis not present

## 2016-09-07 DIAGNOSIS — R2689 Other abnormalities of gait and mobility: Secondary | ICD-10-CM | POA: Diagnosis not present

## 2016-09-07 DIAGNOSIS — Z96651 Presence of right artificial knee joint: Secondary | ICD-10-CM | POA: Diagnosis not present

## 2016-09-07 DIAGNOSIS — M1711 Unilateral primary osteoarthritis, right knee: Secondary | ICD-10-CM | POA: Diagnosis not present

## 2016-09-07 DIAGNOSIS — M6281 Muscle weakness (generalized): Secondary | ICD-10-CM | POA: Diagnosis not present

## 2016-09-08 DIAGNOSIS — M1711 Unilateral primary osteoarthritis, right knee: Secondary | ICD-10-CM | POA: Diagnosis not present

## 2016-09-08 DIAGNOSIS — M6281 Muscle weakness (generalized): Secondary | ICD-10-CM | POA: Diagnosis not present

## 2016-09-08 DIAGNOSIS — Z471 Aftercare following joint replacement surgery: Secondary | ICD-10-CM | POA: Diagnosis not present

## 2016-09-08 DIAGNOSIS — Z96651 Presence of right artificial knee joint: Secondary | ICD-10-CM | POA: Diagnosis not present

## 2016-09-08 DIAGNOSIS — R2689 Other abnormalities of gait and mobility: Secondary | ICD-10-CM | POA: Diagnosis not present

## 2016-09-09 DIAGNOSIS — R2689 Other abnormalities of gait and mobility: Secondary | ICD-10-CM | POA: Diagnosis not present

## 2016-09-09 DIAGNOSIS — M1711 Unilateral primary osteoarthritis, right knee: Secondary | ICD-10-CM | POA: Diagnosis not present

## 2016-09-09 DIAGNOSIS — Z471 Aftercare following joint replacement surgery: Secondary | ICD-10-CM | POA: Diagnosis not present

## 2016-09-09 DIAGNOSIS — Z96651 Presence of right artificial knee joint: Secondary | ICD-10-CM | POA: Diagnosis not present

## 2016-09-09 DIAGNOSIS — M6281 Muscle weakness (generalized): Secondary | ICD-10-CM | POA: Diagnosis not present

## 2016-09-10 DIAGNOSIS — M1711 Unilateral primary osteoarthritis, right knee: Secondary | ICD-10-CM | POA: Diagnosis not present

## 2016-09-10 DIAGNOSIS — R2689 Other abnormalities of gait and mobility: Secondary | ICD-10-CM | POA: Diagnosis not present

## 2016-09-10 DIAGNOSIS — Z471 Aftercare following joint replacement surgery: Secondary | ICD-10-CM | POA: Diagnosis not present

## 2016-09-10 DIAGNOSIS — M6281 Muscle weakness (generalized): Secondary | ICD-10-CM | POA: Diagnosis not present

## 2016-09-10 DIAGNOSIS — Z96651 Presence of right artificial knee joint: Secondary | ICD-10-CM | POA: Diagnosis not present

## 2016-09-13 DIAGNOSIS — Z471 Aftercare following joint replacement surgery: Secondary | ICD-10-CM | POA: Diagnosis not present

## 2016-09-13 DIAGNOSIS — Z96651 Presence of right artificial knee joint: Secondary | ICD-10-CM | POA: Diagnosis not present

## 2016-09-13 DIAGNOSIS — M1711 Unilateral primary osteoarthritis, right knee: Secondary | ICD-10-CM | POA: Diagnosis not present

## 2016-09-13 DIAGNOSIS — M6281 Muscle weakness (generalized): Secondary | ICD-10-CM | POA: Diagnosis not present

## 2016-09-13 DIAGNOSIS — R2689 Other abnormalities of gait and mobility: Secondary | ICD-10-CM | POA: Diagnosis not present

## 2016-09-14 DIAGNOSIS — Z96651 Presence of right artificial knee joint: Secondary | ICD-10-CM | POA: Diagnosis not present

## 2016-09-14 DIAGNOSIS — M6281 Muscle weakness (generalized): Secondary | ICD-10-CM | POA: Diagnosis not present

## 2016-09-14 DIAGNOSIS — R2689 Other abnormalities of gait and mobility: Secondary | ICD-10-CM | POA: Diagnosis not present

## 2016-09-14 DIAGNOSIS — M1711 Unilateral primary osteoarthritis, right knee: Secondary | ICD-10-CM | POA: Diagnosis not present

## 2016-09-14 DIAGNOSIS — Z471 Aftercare following joint replacement surgery: Secondary | ICD-10-CM | POA: Diagnosis not present

## 2016-09-16 DIAGNOSIS — Z96651 Presence of right artificial knee joint: Secondary | ICD-10-CM | POA: Diagnosis not present

## 2016-09-16 DIAGNOSIS — M6281 Muscle weakness (generalized): Secondary | ICD-10-CM | POA: Diagnosis not present

## 2016-09-16 DIAGNOSIS — R2689 Other abnormalities of gait and mobility: Secondary | ICD-10-CM | POA: Diagnosis not present

## 2016-09-16 DIAGNOSIS — M1711 Unilateral primary osteoarthritis, right knee: Secondary | ICD-10-CM | POA: Diagnosis not present

## 2016-09-16 DIAGNOSIS — Z471 Aftercare following joint replacement surgery: Secondary | ICD-10-CM | POA: Diagnosis not present

## 2016-09-20 DIAGNOSIS — M6281 Muscle weakness (generalized): Secondary | ICD-10-CM | POA: Diagnosis not present

## 2016-09-20 DIAGNOSIS — M179 Osteoarthritis of knee, unspecified: Secondary | ICD-10-CM | POA: Diagnosis not present

## 2016-09-20 DIAGNOSIS — R413 Other amnesia: Secondary | ICD-10-CM | POA: Diagnosis not present

## 2016-09-20 DIAGNOSIS — Z96651 Presence of right artificial knee joint: Secondary | ICD-10-CM | POA: Diagnosis not present

## 2016-09-20 DIAGNOSIS — R351 Nocturia: Secondary | ICD-10-CM | POA: Diagnosis not present

## 2016-09-20 DIAGNOSIS — M1711 Unilateral primary osteoarthritis, right knee: Secondary | ICD-10-CM | POA: Diagnosis not present

## 2016-09-20 DIAGNOSIS — Z471 Aftercare following joint replacement surgery: Secondary | ICD-10-CM | POA: Diagnosis not present

## 2016-09-20 DIAGNOSIS — R2689 Other abnormalities of gait and mobility: Secondary | ICD-10-CM | POA: Diagnosis not present

## 2016-09-22 DIAGNOSIS — R2689 Other abnormalities of gait and mobility: Secondary | ICD-10-CM | POA: Diagnosis not present

## 2016-09-22 DIAGNOSIS — Z471 Aftercare following joint replacement surgery: Secondary | ICD-10-CM | POA: Diagnosis not present

## 2016-09-22 DIAGNOSIS — M1711 Unilateral primary osteoarthritis, right knee: Secondary | ICD-10-CM | POA: Diagnosis not present

## 2016-09-22 DIAGNOSIS — M6281 Muscle weakness (generalized): Secondary | ICD-10-CM | POA: Diagnosis not present

## 2016-09-22 DIAGNOSIS — Z96651 Presence of right artificial knee joint: Secondary | ICD-10-CM | POA: Diagnosis not present

## 2016-09-23 DIAGNOSIS — Z96651 Presence of right artificial knee joint: Secondary | ICD-10-CM | POA: Diagnosis not present

## 2016-09-23 DIAGNOSIS — Z471 Aftercare following joint replacement surgery: Secondary | ICD-10-CM | POA: Diagnosis not present

## 2016-09-23 DIAGNOSIS — R2689 Other abnormalities of gait and mobility: Secondary | ICD-10-CM | POA: Diagnosis not present

## 2016-09-23 DIAGNOSIS — M1711 Unilateral primary osteoarthritis, right knee: Secondary | ICD-10-CM | POA: Diagnosis not present

## 2016-09-23 DIAGNOSIS — M6281 Muscle weakness (generalized): Secondary | ICD-10-CM | POA: Diagnosis not present

## 2016-09-24 DIAGNOSIS — Z471 Aftercare following joint replacement surgery: Secondary | ICD-10-CM | POA: Diagnosis not present

## 2016-09-24 DIAGNOSIS — Z96651 Presence of right artificial knee joint: Secondary | ICD-10-CM | POA: Diagnosis not present

## 2016-09-24 DIAGNOSIS — M1711 Unilateral primary osteoarthritis, right knee: Secondary | ICD-10-CM | POA: Diagnosis not present

## 2016-09-24 DIAGNOSIS — M6281 Muscle weakness (generalized): Secondary | ICD-10-CM | POA: Diagnosis not present

## 2016-09-24 DIAGNOSIS — R2689 Other abnormalities of gait and mobility: Secondary | ICD-10-CM | POA: Diagnosis not present

## 2016-09-27 DIAGNOSIS — M6281 Muscle weakness (generalized): Secondary | ICD-10-CM | POA: Diagnosis not present

## 2016-09-27 DIAGNOSIS — M1711 Unilateral primary osteoarthritis, right knee: Secondary | ICD-10-CM | POA: Diagnosis not present

## 2016-09-27 DIAGNOSIS — R2689 Other abnormalities of gait and mobility: Secondary | ICD-10-CM | POA: Diagnosis not present

## 2016-09-27 DIAGNOSIS — Z471 Aftercare following joint replacement surgery: Secondary | ICD-10-CM | POA: Diagnosis not present

## 2016-09-27 DIAGNOSIS — Z96651 Presence of right artificial knee joint: Secondary | ICD-10-CM | POA: Diagnosis not present

## 2016-09-29 DIAGNOSIS — R2689 Other abnormalities of gait and mobility: Secondary | ICD-10-CM | POA: Diagnosis not present

## 2016-09-29 DIAGNOSIS — Z96651 Presence of right artificial knee joint: Secondary | ICD-10-CM | POA: Diagnosis not present

## 2016-09-29 DIAGNOSIS — M1711 Unilateral primary osteoarthritis, right knee: Secondary | ICD-10-CM | POA: Diagnosis not present

## 2016-09-29 DIAGNOSIS — Z471 Aftercare following joint replacement surgery: Secondary | ICD-10-CM | POA: Diagnosis not present

## 2016-09-29 DIAGNOSIS — M6281 Muscle weakness (generalized): Secondary | ICD-10-CM | POA: Diagnosis not present

## 2016-09-30 DIAGNOSIS — M6281 Muscle weakness (generalized): Secondary | ICD-10-CM | POA: Diagnosis not present

## 2016-09-30 DIAGNOSIS — M1711 Unilateral primary osteoarthritis, right knee: Secondary | ICD-10-CM | POA: Diagnosis not present

## 2016-09-30 DIAGNOSIS — Z471 Aftercare following joint replacement surgery: Secondary | ICD-10-CM | POA: Diagnosis not present

## 2016-09-30 DIAGNOSIS — R2689 Other abnormalities of gait and mobility: Secondary | ICD-10-CM | POA: Diagnosis not present

## 2016-09-30 DIAGNOSIS — Z96651 Presence of right artificial knee joint: Secondary | ICD-10-CM | POA: Diagnosis not present

## 2016-10-04 DIAGNOSIS — M6281 Muscle weakness (generalized): Secondary | ICD-10-CM | POA: Diagnosis not present

## 2016-10-04 DIAGNOSIS — Z471 Aftercare following joint replacement surgery: Secondary | ICD-10-CM | POA: Diagnosis not present

## 2016-10-04 DIAGNOSIS — Z96651 Presence of right artificial knee joint: Secondary | ICD-10-CM | POA: Diagnosis not present

## 2016-10-04 DIAGNOSIS — M1711 Unilateral primary osteoarthritis, right knee: Secondary | ICD-10-CM | POA: Diagnosis not present

## 2016-10-04 DIAGNOSIS — R2689 Other abnormalities of gait and mobility: Secondary | ICD-10-CM | POA: Diagnosis not present

## 2016-10-05 DIAGNOSIS — R2689 Other abnormalities of gait and mobility: Secondary | ICD-10-CM | POA: Diagnosis not present

## 2016-10-05 DIAGNOSIS — Z96651 Presence of right artificial knee joint: Secondary | ICD-10-CM | POA: Diagnosis not present

## 2016-10-05 DIAGNOSIS — Z471 Aftercare following joint replacement surgery: Secondary | ICD-10-CM | POA: Diagnosis not present

## 2016-10-05 DIAGNOSIS — M1711 Unilateral primary osteoarthritis, right knee: Secondary | ICD-10-CM | POA: Diagnosis not present

## 2016-10-05 DIAGNOSIS — M6281 Muscle weakness (generalized): Secondary | ICD-10-CM | POA: Diagnosis not present

## 2016-10-07 DIAGNOSIS — M1711 Unilateral primary osteoarthritis, right knee: Secondary | ICD-10-CM | POA: Diagnosis not present

## 2016-10-07 DIAGNOSIS — Z471 Aftercare following joint replacement surgery: Secondary | ICD-10-CM | POA: Diagnosis not present

## 2016-10-07 DIAGNOSIS — Z96651 Presence of right artificial knee joint: Secondary | ICD-10-CM | POA: Diagnosis not present

## 2016-10-07 DIAGNOSIS — R2689 Other abnormalities of gait and mobility: Secondary | ICD-10-CM | POA: Diagnosis not present

## 2016-10-07 DIAGNOSIS — M6281 Muscle weakness (generalized): Secondary | ICD-10-CM | POA: Diagnosis not present

## 2016-10-08 DIAGNOSIS — Z96651 Presence of right artificial knee joint: Secondary | ICD-10-CM | POA: Diagnosis not present

## 2016-10-08 DIAGNOSIS — R2689 Other abnormalities of gait and mobility: Secondary | ICD-10-CM | POA: Diagnosis not present

## 2016-10-08 DIAGNOSIS — M1711 Unilateral primary osteoarthritis, right knee: Secondary | ICD-10-CM | POA: Diagnosis not present

## 2016-10-08 DIAGNOSIS — M6281 Muscle weakness (generalized): Secondary | ICD-10-CM | POA: Diagnosis not present

## 2016-10-08 DIAGNOSIS — Z471 Aftercare following joint replacement surgery: Secondary | ICD-10-CM | POA: Diagnosis not present

## 2016-10-11 DIAGNOSIS — Z96651 Presence of right artificial knee joint: Secondary | ICD-10-CM | POA: Diagnosis not present

## 2016-10-11 DIAGNOSIS — M1711 Unilateral primary osteoarthritis, right knee: Secondary | ICD-10-CM | POA: Diagnosis not present

## 2016-10-11 DIAGNOSIS — M6281 Muscle weakness (generalized): Secondary | ICD-10-CM | POA: Diagnosis not present

## 2016-10-11 DIAGNOSIS — Z471 Aftercare following joint replacement surgery: Secondary | ICD-10-CM | POA: Diagnosis not present

## 2016-10-11 DIAGNOSIS — R2689 Other abnormalities of gait and mobility: Secondary | ICD-10-CM | POA: Diagnosis not present

## 2016-10-12 DIAGNOSIS — M6281 Muscle weakness (generalized): Secondary | ICD-10-CM | POA: Diagnosis not present

## 2016-10-12 DIAGNOSIS — Z96651 Presence of right artificial knee joint: Secondary | ICD-10-CM | POA: Diagnosis not present

## 2016-10-12 DIAGNOSIS — M1711 Unilateral primary osteoarthritis, right knee: Secondary | ICD-10-CM | POA: Diagnosis not present

## 2016-10-12 DIAGNOSIS — Z471 Aftercare following joint replacement surgery: Secondary | ICD-10-CM | POA: Diagnosis not present

## 2016-10-12 DIAGNOSIS — R2689 Other abnormalities of gait and mobility: Secondary | ICD-10-CM | POA: Diagnosis not present

## 2016-10-13 DIAGNOSIS — Z96651 Presence of right artificial knee joint: Secondary | ICD-10-CM | POA: Diagnosis not present

## 2016-10-13 DIAGNOSIS — M1711 Unilateral primary osteoarthritis, right knee: Secondary | ICD-10-CM | POA: Diagnosis not present

## 2016-10-13 DIAGNOSIS — R2689 Other abnormalities of gait and mobility: Secondary | ICD-10-CM | POA: Diagnosis not present

## 2016-10-13 DIAGNOSIS — Z471 Aftercare following joint replacement surgery: Secondary | ICD-10-CM | POA: Diagnosis not present

## 2016-10-13 DIAGNOSIS — M6281 Muscle weakness (generalized): Secondary | ICD-10-CM | POA: Diagnosis not present

## 2016-10-14 DIAGNOSIS — Z96651 Presence of right artificial knee joint: Secondary | ICD-10-CM | POA: Diagnosis not present

## 2016-10-14 DIAGNOSIS — M6281 Muscle weakness (generalized): Secondary | ICD-10-CM | POA: Diagnosis not present

## 2016-10-14 DIAGNOSIS — Z471 Aftercare following joint replacement surgery: Secondary | ICD-10-CM | POA: Diagnosis not present

## 2016-10-14 DIAGNOSIS — R2689 Other abnormalities of gait and mobility: Secondary | ICD-10-CM | POA: Diagnosis not present

## 2016-10-14 DIAGNOSIS — M1711 Unilateral primary osteoarthritis, right knee: Secondary | ICD-10-CM | POA: Diagnosis not present

## 2016-10-19 DIAGNOSIS — R2689 Other abnormalities of gait and mobility: Secondary | ICD-10-CM | POA: Diagnosis not present

## 2016-10-19 DIAGNOSIS — Z471 Aftercare following joint replacement surgery: Secondary | ICD-10-CM | POA: Diagnosis not present

## 2016-10-19 DIAGNOSIS — M1711 Unilateral primary osteoarthritis, right knee: Secondary | ICD-10-CM | POA: Diagnosis not present

## 2016-10-19 DIAGNOSIS — M6281 Muscle weakness (generalized): Secondary | ICD-10-CM | POA: Diagnosis not present

## 2016-10-19 DIAGNOSIS — Z96651 Presence of right artificial knee joint: Secondary | ICD-10-CM | POA: Diagnosis not present

## 2016-10-20 DIAGNOSIS — M1711 Unilateral primary osteoarthritis, right knee: Secondary | ICD-10-CM | POA: Diagnosis not present

## 2016-10-20 DIAGNOSIS — Z96651 Presence of right artificial knee joint: Secondary | ICD-10-CM | POA: Diagnosis not present

## 2016-10-20 DIAGNOSIS — Z471 Aftercare following joint replacement surgery: Secondary | ICD-10-CM | POA: Diagnosis not present

## 2016-10-20 DIAGNOSIS — M6281 Muscle weakness (generalized): Secondary | ICD-10-CM | POA: Diagnosis not present

## 2016-10-20 DIAGNOSIS — R2689 Other abnormalities of gait and mobility: Secondary | ICD-10-CM | POA: Diagnosis not present

## 2016-10-21 DIAGNOSIS — M1711 Unilateral primary osteoarthritis, right knee: Secondary | ICD-10-CM | POA: Diagnosis not present

## 2016-10-21 DIAGNOSIS — Z96651 Presence of right artificial knee joint: Secondary | ICD-10-CM | POA: Diagnosis not present

## 2016-10-21 DIAGNOSIS — Z471 Aftercare following joint replacement surgery: Secondary | ICD-10-CM | POA: Diagnosis not present

## 2016-10-21 DIAGNOSIS — R2689 Other abnormalities of gait and mobility: Secondary | ICD-10-CM | POA: Diagnosis not present

## 2016-10-21 DIAGNOSIS — M6281 Muscle weakness (generalized): Secondary | ICD-10-CM | POA: Diagnosis not present

## 2016-10-22 DIAGNOSIS — Z471 Aftercare following joint replacement surgery: Secondary | ICD-10-CM | POA: Diagnosis not present

## 2016-10-22 DIAGNOSIS — M1711 Unilateral primary osteoarthritis, right knee: Secondary | ICD-10-CM | POA: Diagnosis not present

## 2016-10-22 DIAGNOSIS — Z96651 Presence of right artificial knee joint: Secondary | ICD-10-CM | POA: Diagnosis not present

## 2016-10-22 DIAGNOSIS — R2689 Other abnormalities of gait and mobility: Secondary | ICD-10-CM | POA: Diagnosis not present

## 2016-10-22 DIAGNOSIS — M6281 Muscle weakness (generalized): Secondary | ICD-10-CM | POA: Diagnosis not present

## 2016-10-26 DIAGNOSIS — M6281 Muscle weakness (generalized): Secondary | ICD-10-CM | POA: Diagnosis not present

## 2016-10-26 DIAGNOSIS — Z96651 Presence of right artificial knee joint: Secondary | ICD-10-CM | POA: Diagnosis not present

## 2016-10-26 DIAGNOSIS — Z471 Aftercare following joint replacement surgery: Secondary | ICD-10-CM | POA: Diagnosis not present

## 2016-10-26 DIAGNOSIS — R2689 Other abnormalities of gait and mobility: Secondary | ICD-10-CM | POA: Diagnosis not present

## 2016-10-26 DIAGNOSIS — M1711 Unilateral primary osteoarthritis, right knee: Secondary | ICD-10-CM | POA: Diagnosis not present

## 2016-10-27 DIAGNOSIS — R2689 Other abnormalities of gait and mobility: Secondary | ICD-10-CM | POA: Diagnosis not present

## 2016-10-27 DIAGNOSIS — Z471 Aftercare following joint replacement surgery: Secondary | ICD-10-CM | POA: Diagnosis not present

## 2016-10-27 DIAGNOSIS — Z96651 Presence of right artificial knee joint: Secondary | ICD-10-CM | POA: Diagnosis not present

## 2016-10-27 DIAGNOSIS — M6281 Muscle weakness (generalized): Secondary | ICD-10-CM | POA: Diagnosis not present

## 2016-10-27 DIAGNOSIS — M1711 Unilateral primary osteoarthritis, right knee: Secondary | ICD-10-CM | POA: Diagnosis not present

## 2016-10-28 DIAGNOSIS — M6281 Muscle weakness (generalized): Secondary | ICD-10-CM | POA: Diagnosis not present

## 2016-10-28 DIAGNOSIS — Z471 Aftercare following joint replacement surgery: Secondary | ICD-10-CM | POA: Diagnosis not present

## 2016-10-28 DIAGNOSIS — Z96651 Presence of right artificial knee joint: Secondary | ICD-10-CM | POA: Diagnosis not present

## 2016-10-28 DIAGNOSIS — R2689 Other abnormalities of gait and mobility: Secondary | ICD-10-CM | POA: Diagnosis not present

## 2016-10-28 DIAGNOSIS — M1711 Unilateral primary osteoarthritis, right knee: Secondary | ICD-10-CM | POA: Diagnosis not present

## 2016-10-29 DIAGNOSIS — M1711 Unilateral primary osteoarthritis, right knee: Secondary | ICD-10-CM | POA: Diagnosis not present

## 2016-10-29 DIAGNOSIS — Z471 Aftercare following joint replacement surgery: Secondary | ICD-10-CM | POA: Diagnosis not present

## 2016-10-29 DIAGNOSIS — R2689 Other abnormalities of gait and mobility: Secondary | ICD-10-CM | POA: Diagnosis not present

## 2016-10-29 DIAGNOSIS — M6281 Muscle weakness (generalized): Secondary | ICD-10-CM | POA: Diagnosis not present

## 2016-10-29 DIAGNOSIS — Z96651 Presence of right artificial knee joint: Secondary | ICD-10-CM | POA: Diagnosis not present

## 2016-11-01 DIAGNOSIS — Z471 Aftercare following joint replacement surgery: Secondary | ICD-10-CM | POA: Diagnosis not present

## 2016-11-01 DIAGNOSIS — M1711 Unilateral primary osteoarthritis, right knee: Secondary | ICD-10-CM | POA: Diagnosis not present

## 2016-11-01 DIAGNOSIS — R2689 Other abnormalities of gait and mobility: Secondary | ICD-10-CM | POA: Diagnosis not present

## 2016-11-01 DIAGNOSIS — M6281 Muscle weakness (generalized): Secondary | ICD-10-CM | POA: Diagnosis not present

## 2016-11-01 DIAGNOSIS — Z96651 Presence of right artificial knee joint: Secondary | ICD-10-CM | POA: Diagnosis not present

## 2016-11-02 DIAGNOSIS — M1711 Unilateral primary osteoarthritis, right knee: Secondary | ICD-10-CM | POA: Diagnosis not present

## 2016-11-02 DIAGNOSIS — M6281 Muscle weakness (generalized): Secondary | ICD-10-CM | POA: Diagnosis not present

## 2016-11-02 DIAGNOSIS — Z471 Aftercare following joint replacement surgery: Secondary | ICD-10-CM | POA: Diagnosis not present

## 2016-11-02 DIAGNOSIS — Z96651 Presence of right artificial knee joint: Secondary | ICD-10-CM | POA: Diagnosis not present

## 2016-11-02 DIAGNOSIS — R2689 Other abnormalities of gait and mobility: Secondary | ICD-10-CM | POA: Diagnosis not present

## 2016-11-04 DIAGNOSIS — M6281 Muscle weakness (generalized): Secondary | ICD-10-CM | POA: Diagnosis not present

## 2016-11-04 DIAGNOSIS — M1711 Unilateral primary osteoarthritis, right knee: Secondary | ICD-10-CM | POA: Diagnosis not present

## 2016-11-04 DIAGNOSIS — Z96651 Presence of right artificial knee joint: Secondary | ICD-10-CM | POA: Diagnosis not present

## 2016-11-04 DIAGNOSIS — Z471 Aftercare following joint replacement surgery: Secondary | ICD-10-CM | POA: Diagnosis not present

## 2016-11-04 DIAGNOSIS — R2689 Other abnormalities of gait and mobility: Secondary | ICD-10-CM | POA: Diagnosis not present

## 2016-11-05 DIAGNOSIS — R2689 Other abnormalities of gait and mobility: Secondary | ICD-10-CM | POA: Diagnosis not present

## 2016-11-05 DIAGNOSIS — Z471 Aftercare following joint replacement surgery: Secondary | ICD-10-CM | POA: Diagnosis not present

## 2016-11-05 DIAGNOSIS — M1711 Unilateral primary osteoarthritis, right knee: Secondary | ICD-10-CM | POA: Diagnosis not present

## 2016-11-05 DIAGNOSIS — M6281 Muscle weakness (generalized): Secondary | ICD-10-CM | POA: Diagnosis not present

## 2016-11-05 DIAGNOSIS — Z96651 Presence of right artificial knee joint: Secondary | ICD-10-CM | POA: Diagnosis not present

## 2016-11-08 DIAGNOSIS — Z471 Aftercare following joint replacement surgery: Secondary | ICD-10-CM | POA: Diagnosis not present

## 2016-11-08 DIAGNOSIS — Z96651 Presence of right artificial knee joint: Secondary | ICD-10-CM | POA: Diagnosis not present

## 2016-11-08 DIAGNOSIS — M1711 Unilateral primary osteoarthritis, right knee: Secondary | ICD-10-CM | POA: Diagnosis not present

## 2016-11-08 DIAGNOSIS — R2689 Other abnormalities of gait and mobility: Secondary | ICD-10-CM | POA: Diagnosis not present

## 2016-11-08 DIAGNOSIS — M6281 Muscle weakness (generalized): Secondary | ICD-10-CM | POA: Diagnosis not present

## 2016-11-09 DIAGNOSIS — R2689 Other abnormalities of gait and mobility: Secondary | ICD-10-CM | POA: Diagnosis not present

## 2016-11-09 DIAGNOSIS — M6281 Muscle weakness (generalized): Secondary | ICD-10-CM | POA: Diagnosis not present

## 2016-11-09 DIAGNOSIS — Z96651 Presence of right artificial knee joint: Secondary | ICD-10-CM | POA: Diagnosis not present

## 2016-11-09 DIAGNOSIS — M1711 Unilateral primary osteoarthritis, right knee: Secondary | ICD-10-CM | POA: Diagnosis not present

## 2016-11-09 DIAGNOSIS — Z471 Aftercare following joint replacement surgery: Secondary | ICD-10-CM | POA: Diagnosis not present

## 2016-11-12 DIAGNOSIS — M1711 Unilateral primary osteoarthritis, right knee: Secondary | ICD-10-CM | POA: Diagnosis not present

## 2016-11-12 DIAGNOSIS — R2689 Other abnormalities of gait and mobility: Secondary | ICD-10-CM | POA: Diagnosis not present

## 2016-11-12 DIAGNOSIS — Z96651 Presence of right artificial knee joint: Secondary | ICD-10-CM | POA: Diagnosis not present

## 2016-11-12 DIAGNOSIS — Z471 Aftercare following joint replacement surgery: Secondary | ICD-10-CM | POA: Diagnosis not present

## 2016-11-12 DIAGNOSIS — M6281 Muscle weakness (generalized): Secondary | ICD-10-CM | POA: Diagnosis not present

## 2016-11-15 DIAGNOSIS — M6281 Muscle weakness (generalized): Secondary | ICD-10-CM | POA: Diagnosis not present

## 2016-11-15 DIAGNOSIS — M1711 Unilateral primary osteoarthritis, right knee: Secondary | ICD-10-CM | POA: Diagnosis not present

## 2016-11-15 DIAGNOSIS — R2689 Other abnormalities of gait and mobility: Secondary | ICD-10-CM | POA: Diagnosis not present

## 2016-11-15 DIAGNOSIS — Z96651 Presence of right artificial knee joint: Secondary | ICD-10-CM | POA: Diagnosis not present

## 2016-11-15 DIAGNOSIS — Z471 Aftercare following joint replacement surgery: Secondary | ICD-10-CM | POA: Diagnosis not present

## 2016-11-16 DIAGNOSIS — R2689 Other abnormalities of gait and mobility: Secondary | ICD-10-CM | POA: Diagnosis not present

## 2016-11-16 DIAGNOSIS — M6281 Muscle weakness (generalized): Secondary | ICD-10-CM | POA: Diagnosis not present

## 2016-11-16 DIAGNOSIS — M1711 Unilateral primary osteoarthritis, right knee: Secondary | ICD-10-CM | POA: Diagnosis not present

## 2016-11-16 DIAGNOSIS — Z96651 Presence of right artificial knee joint: Secondary | ICD-10-CM | POA: Diagnosis not present

## 2016-11-16 DIAGNOSIS — Z471 Aftercare following joint replacement surgery: Secondary | ICD-10-CM | POA: Diagnosis not present

## 2016-11-18 DIAGNOSIS — Z471 Aftercare following joint replacement surgery: Secondary | ICD-10-CM | POA: Diagnosis not present

## 2016-11-18 DIAGNOSIS — M1711 Unilateral primary osteoarthritis, right knee: Secondary | ICD-10-CM | POA: Diagnosis not present

## 2016-11-18 DIAGNOSIS — R2689 Other abnormalities of gait and mobility: Secondary | ICD-10-CM | POA: Diagnosis not present

## 2016-11-18 DIAGNOSIS — M6281 Muscle weakness (generalized): Secondary | ICD-10-CM | POA: Diagnosis not present

## 2016-11-18 DIAGNOSIS — Z96651 Presence of right artificial knee joint: Secondary | ICD-10-CM | POA: Diagnosis not present

## 2016-11-19 DIAGNOSIS — Z96651 Presence of right artificial knee joint: Secondary | ICD-10-CM | POA: Diagnosis not present

## 2016-11-19 DIAGNOSIS — Z471 Aftercare following joint replacement surgery: Secondary | ICD-10-CM | POA: Diagnosis not present

## 2016-11-19 DIAGNOSIS — M6281 Muscle weakness (generalized): Secondary | ICD-10-CM | POA: Diagnosis not present

## 2016-11-19 DIAGNOSIS — M1711 Unilateral primary osteoarthritis, right knee: Secondary | ICD-10-CM | POA: Diagnosis not present

## 2016-11-19 DIAGNOSIS — R2689 Other abnormalities of gait and mobility: Secondary | ICD-10-CM | POA: Diagnosis not present

## 2016-11-22 DIAGNOSIS — R2689 Other abnormalities of gait and mobility: Secondary | ICD-10-CM | POA: Diagnosis not present

## 2016-11-22 DIAGNOSIS — Z96651 Presence of right artificial knee joint: Secondary | ICD-10-CM | POA: Diagnosis not present

## 2016-11-22 DIAGNOSIS — Z471 Aftercare following joint replacement surgery: Secondary | ICD-10-CM | POA: Diagnosis not present

## 2016-11-22 DIAGNOSIS — M6281 Muscle weakness (generalized): Secondary | ICD-10-CM | POA: Diagnosis not present

## 2016-11-22 DIAGNOSIS — M1711 Unilateral primary osteoarthritis, right knee: Secondary | ICD-10-CM | POA: Diagnosis not present

## 2016-11-23 DIAGNOSIS — Z96651 Presence of right artificial knee joint: Secondary | ICD-10-CM | POA: Diagnosis not present

## 2016-11-23 DIAGNOSIS — M1711 Unilateral primary osteoarthritis, right knee: Secondary | ICD-10-CM | POA: Diagnosis not present

## 2016-11-23 DIAGNOSIS — M6281 Muscle weakness (generalized): Secondary | ICD-10-CM | POA: Diagnosis not present

## 2016-11-23 DIAGNOSIS — Z471 Aftercare following joint replacement surgery: Secondary | ICD-10-CM | POA: Diagnosis not present

## 2016-11-23 DIAGNOSIS — R2689 Other abnormalities of gait and mobility: Secondary | ICD-10-CM | POA: Diagnosis not present

## 2016-11-24 DIAGNOSIS — Z96651 Presence of right artificial knee joint: Secondary | ICD-10-CM | POA: Diagnosis not present

## 2016-11-24 DIAGNOSIS — M1711 Unilateral primary osteoarthritis, right knee: Secondary | ICD-10-CM | POA: Diagnosis not present

## 2016-11-24 DIAGNOSIS — R2689 Other abnormalities of gait and mobility: Secondary | ICD-10-CM | POA: Diagnosis not present

## 2016-11-24 DIAGNOSIS — M6281 Muscle weakness (generalized): Secondary | ICD-10-CM | POA: Diagnosis not present

## 2016-11-24 DIAGNOSIS — Z471 Aftercare following joint replacement surgery: Secondary | ICD-10-CM | POA: Diagnosis not present

## 2016-11-25 DIAGNOSIS — Z471 Aftercare following joint replacement surgery: Secondary | ICD-10-CM | POA: Diagnosis not present

## 2016-11-25 DIAGNOSIS — M1711 Unilateral primary osteoarthritis, right knee: Secondary | ICD-10-CM | POA: Diagnosis not present

## 2016-11-25 DIAGNOSIS — M6281 Muscle weakness (generalized): Secondary | ICD-10-CM | POA: Diagnosis not present

## 2016-11-25 DIAGNOSIS — R2689 Other abnormalities of gait and mobility: Secondary | ICD-10-CM | POA: Diagnosis not present

## 2016-11-25 DIAGNOSIS — Z96651 Presence of right artificial knee joint: Secondary | ICD-10-CM | POA: Diagnosis not present

## 2016-11-26 DIAGNOSIS — Z471 Aftercare following joint replacement surgery: Secondary | ICD-10-CM | POA: Diagnosis not present

## 2016-11-26 DIAGNOSIS — Z96651 Presence of right artificial knee joint: Secondary | ICD-10-CM | POA: Diagnosis not present

## 2016-11-26 DIAGNOSIS — M1711 Unilateral primary osteoarthritis, right knee: Secondary | ICD-10-CM | POA: Diagnosis not present

## 2016-11-26 DIAGNOSIS — M6281 Muscle weakness (generalized): Secondary | ICD-10-CM | POA: Diagnosis not present

## 2016-11-26 DIAGNOSIS — R2689 Other abnormalities of gait and mobility: Secondary | ICD-10-CM | POA: Diagnosis not present

## 2016-11-29 DIAGNOSIS — Z96651 Presence of right artificial knee joint: Secondary | ICD-10-CM | POA: Diagnosis not present

## 2016-11-29 DIAGNOSIS — M6281 Muscle weakness (generalized): Secondary | ICD-10-CM | POA: Diagnosis not present

## 2016-11-29 DIAGNOSIS — R2689 Other abnormalities of gait and mobility: Secondary | ICD-10-CM | POA: Diagnosis not present

## 2016-11-29 DIAGNOSIS — Z471 Aftercare following joint replacement surgery: Secondary | ICD-10-CM | POA: Diagnosis not present

## 2016-11-29 DIAGNOSIS — M1711 Unilateral primary osteoarthritis, right knee: Secondary | ICD-10-CM | POA: Diagnosis not present

## 2016-12-01 DIAGNOSIS — Z471 Aftercare following joint replacement surgery: Secondary | ICD-10-CM | POA: Diagnosis not present

## 2016-12-01 DIAGNOSIS — M6281 Muscle weakness (generalized): Secondary | ICD-10-CM | POA: Diagnosis not present

## 2016-12-01 DIAGNOSIS — M1711 Unilateral primary osteoarthritis, right knee: Secondary | ICD-10-CM | POA: Diagnosis not present

## 2016-12-01 DIAGNOSIS — R2689 Other abnormalities of gait and mobility: Secondary | ICD-10-CM | POA: Diagnosis not present

## 2016-12-01 DIAGNOSIS — Z96651 Presence of right artificial knee joint: Secondary | ICD-10-CM | POA: Diagnosis not present

## 2016-12-03 DIAGNOSIS — Z471 Aftercare following joint replacement surgery: Secondary | ICD-10-CM | POA: Diagnosis not present

## 2016-12-03 DIAGNOSIS — Z96651 Presence of right artificial knee joint: Secondary | ICD-10-CM | POA: Diagnosis not present

## 2016-12-03 DIAGNOSIS — M6281 Muscle weakness (generalized): Secondary | ICD-10-CM | POA: Diagnosis not present

## 2016-12-03 DIAGNOSIS — M1711 Unilateral primary osteoarthritis, right knee: Secondary | ICD-10-CM | POA: Diagnosis not present

## 2016-12-03 DIAGNOSIS — R2689 Other abnormalities of gait and mobility: Secondary | ICD-10-CM | POA: Diagnosis not present

## 2016-12-06 DIAGNOSIS — Z96651 Presence of right artificial knee joint: Secondary | ICD-10-CM | POA: Diagnosis not present

## 2016-12-06 DIAGNOSIS — M1711 Unilateral primary osteoarthritis, right knee: Secondary | ICD-10-CM | POA: Diagnosis not present

## 2016-12-06 DIAGNOSIS — Z471 Aftercare following joint replacement surgery: Secondary | ICD-10-CM | POA: Diagnosis not present

## 2016-12-06 DIAGNOSIS — M6281 Muscle weakness (generalized): Secondary | ICD-10-CM | POA: Diagnosis not present

## 2016-12-06 DIAGNOSIS — R2689 Other abnormalities of gait and mobility: Secondary | ICD-10-CM | POA: Diagnosis not present

## 2016-12-08 DIAGNOSIS — M1711 Unilateral primary osteoarthritis, right knee: Secondary | ICD-10-CM | POA: Diagnosis not present

## 2016-12-08 DIAGNOSIS — R2689 Other abnormalities of gait and mobility: Secondary | ICD-10-CM | POA: Diagnosis not present

## 2016-12-08 DIAGNOSIS — Z471 Aftercare following joint replacement surgery: Secondary | ICD-10-CM | POA: Diagnosis not present

## 2016-12-08 DIAGNOSIS — Z96651 Presence of right artificial knee joint: Secondary | ICD-10-CM | POA: Diagnosis not present

## 2016-12-08 DIAGNOSIS — M6281 Muscle weakness (generalized): Secondary | ICD-10-CM | POA: Diagnosis not present

## 2016-12-10 DIAGNOSIS — Z96651 Presence of right artificial knee joint: Secondary | ICD-10-CM | POA: Diagnosis not present

## 2016-12-10 DIAGNOSIS — R2689 Other abnormalities of gait and mobility: Secondary | ICD-10-CM | POA: Diagnosis not present

## 2016-12-10 DIAGNOSIS — Z471 Aftercare following joint replacement surgery: Secondary | ICD-10-CM | POA: Diagnosis not present

## 2016-12-10 DIAGNOSIS — M1711 Unilateral primary osteoarthritis, right knee: Secondary | ICD-10-CM | POA: Diagnosis not present

## 2016-12-10 DIAGNOSIS — M6281 Muscle weakness (generalized): Secondary | ICD-10-CM | POA: Diagnosis not present

## 2016-12-14 DIAGNOSIS — M1711 Unilateral primary osteoarthritis, right knee: Secondary | ICD-10-CM | POA: Diagnosis not present

## 2016-12-14 DIAGNOSIS — Z96651 Presence of right artificial knee joint: Secondary | ICD-10-CM | POA: Diagnosis not present

## 2016-12-14 DIAGNOSIS — M6281 Muscle weakness (generalized): Secondary | ICD-10-CM | POA: Diagnosis not present

## 2016-12-14 DIAGNOSIS — R2689 Other abnormalities of gait and mobility: Secondary | ICD-10-CM | POA: Diagnosis not present

## 2016-12-14 DIAGNOSIS — Z471 Aftercare following joint replacement surgery: Secondary | ICD-10-CM | POA: Diagnosis not present

## 2016-12-15 DIAGNOSIS — Z96651 Presence of right artificial knee joint: Secondary | ICD-10-CM | POA: Diagnosis not present

## 2016-12-15 DIAGNOSIS — M6281 Muscle weakness (generalized): Secondary | ICD-10-CM | POA: Diagnosis not present

## 2016-12-15 DIAGNOSIS — R2689 Other abnormalities of gait and mobility: Secondary | ICD-10-CM | POA: Diagnosis not present

## 2016-12-15 DIAGNOSIS — M1711 Unilateral primary osteoarthritis, right knee: Secondary | ICD-10-CM | POA: Diagnosis not present

## 2016-12-15 DIAGNOSIS — Z471 Aftercare following joint replacement surgery: Secondary | ICD-10-CM | POA: Diagnosis not present

## 2016-12-17 DIAGNOSIS — Z471 Aftercare following joint replacement surgery: Secondary | ICD-10-CM | POA: Diagnosis not present

## 2016-12-17 DIAGNOSIS — M1711 Unilateral primary osteoarthritis, right knee: Secondary | ICD-10-CM | POA: Diagnosis not present

## 2016-12-17 DIAGNOSIS — R2689 Other abnormalities of gait and mobility: Secondary | ICD-10-CM | POA: Diagnosis not present

## 2016-12-17 DIAGNOSIS — M6281 Muscle weakness (generalized): Secondary | ICD-10-CM | POA: Diagnosis not present

## 2016-12-17 DIAGNOSIS — Z96651 Presence of right artificial knee joint: Secondary | ICD-10-CM | POA: Diagnosis not present

## 2017-03-30 DIAGNOSIS — Z0000 Encounter for general adult medical examination without abnormal findings: Secondary | ICD-10-CM | POA: Diagnosis not present

## 2017-03-30 DIAGNOSIS — E7800 Pure hypercholesterolemia, unspecified: Secondary | ICD-10-CM | POA: Diagnosis not present

## 2017-03-30 DIAGNOSIS — M5417 Radiculopathy, lumbosacral region: Secondary | ICD-10-CM | POA: Diagnosis not present

## 2017-03-30 DIAGNOSIS — L570 Actinic keratosis: Secondary | ICD-10-CM | POA: Diagnosis not present

## 2017-03-30 DIAGNOSIS — I10 Essential (primary) hypertension: Secondary | ICD-10-CM | POA: Diagnosis not present

## 2017-03-30 DIAGNOSIS — Z23 Encounter for immunization: Secondary | ICD-10-CM | POA: Diagnosis not present

## 2017-03-30 DIAGNOSIS — E221 Hyperprolactinemia: Secondary | ICD-10-CM | POA: Diagnosis not present

## 2017-09-27 DIAGNOSIS — J449 Chronic obstructive pulmonary disease, unspecified: Secondary | ICD-10-CM | POA: Diagnosis not present

## 2017-09-27 DIAGNOSIS — R531 Weakness: Secondary | ICD-10-CM | POA: Diagnosis not present

## 2017-09-29 DIAGNOSIS — Z96652 Presence of left artificial knee joint: Secondary | ICD-10-CM | POA: Diagnosis not present

## 2017-09-29 DIAGNOSIS — J449 Chronic obstructive pulmonary disease, unspecified: Secondary | ICD-10-CM | POA: Diagnosis not present

## 2017-09-29 DIAGNOSIS — Z7982 Long term (current) use of aspirin: Secondary | ICD-10-CM | POA: Diagnosis not present

## 2017-09-29 DIAGNOSIS — M6281 Muscle weakness (generalized): Secondary | ICD-10-CM | POA: Diagnosis not present

## 2017-09-29 DIAGNOSIS — Z87891 Personal history of nicotine dependence: Secondary | ICD-10-CM | POA: Diagnosis not present

## 2017-09-29 DIAGNOSIS — R2689 Other abnormalities of gait and mobility: Secondary | ICD-10-CM | POA: Diagnosis not present

## 2017-10-03 DIAGNOSIS — Z7982 Long term (current) use of aspirin: Secondary | ICD-10-CM | POA: Diagnosis not present

## 2017-10-03 DIAGNOSIS — Z96652 Presence of left artificial knee joint: Secondary | ICD-10-CM | POA: Diagnosis not present

## 2017-10-03 DIAGNOSIS — M6281 Muscle weakness (generalized): Secondary | ICD-10-CM | POA: Diagnosis not present

## 2017-10-03 DIAGNOSIS — R2689 Other abnormalities of gait and mobility: Secondary | ICD-10-CM | POA: Diagnosis not present

## 2017-10-03 DIAGNOSIS — Z87891 Personal history of nicotine dependence: Secondary | ICD-10-CM | POA: Diagnosis not present

## 2017-10-03 DIAGNOSIS — J449 Chronic obstructive pulmonary disease, unspecified: Secondary | ICD-10-CM | POA: Diagnosis not present

## 2017-10-06 DIAGNOSIS — R2689 Other abnormalities of gait and mobility: Secondary | ICD-10-CM | POA: Diagnosis not present

## 2017-10-06 DIAGNOSIS — J449 Chronic obstructive pulmonary disease, unspecified: Secondary | ICD-10-CM | POA: Diagnosis not present

## 2017-10-06 DIAGNOSIS — Z87891 Personal history of nicotine dependence: Secondary | ICD-10-CM | POA: Diagnosis not present

## 2017-10-06 DIAGNOSIS — Z7982 Long term (current) use of aspirin: Secondary | ICD-10-CM | POA: Diagnosis not present

## 2017-10-06 DIAGNOSIS — M6281 Muscle weakness (generalized): Secondary | ICD-10-CM | POA: Diagnosis not present

## 2017-10-06 DIAGNOSIS — Z96652 Presence of left artificial knee joint: Secondary | ICD-10-CM | POA: Diagnosis not present

## 2017-10-11 DIAGNOSIS — M6281 Muscle weakness (generalized): Secondary | ICD-10-CM | POA: Diagnosis not present

## 2017-10-11 DIAGNOSIS — R2689 Other abnormalities of gait and mobility: Secondary | ICD-10-CM | POA: Diagnosis not present

## 2017-10-11 DIAGNOSIS — J449 Chronic obstructive pulmonary disease, unspecified: Secondary | ICD-10-CM | POA: Diagnosis not present

## 2017-10-11 DIAGNOSIS — Z7982 Long term (current) use of aspirin: Secondary | ICD-10-CM | POA: Diagnosis not present

## 2017-10-11 DIAGNOSIS — Z96652 Presence of left artificial knee joint: Secondary | ICD-10-CM | POA: Diagnosis not present

## 2017-10-11 DIAGNOSIS — Z87891 Personal history of nicotine dependence: Secondary | ICD-10-CM | POA: Diagnosis not present

## 2017-10-13 DIAGNOSIS — Z7982 Long term (current) use of aspirin: Secondary | ICD-10-CM | POA: Diagnosis not present

## 2017-10-13 DIAGNOSIS — R2689 Other abnormalities of gait and mobility: Secondary | ICD-10-CM | POA: Diagnosis not present

## 2017-10-13 DIAGNOSIS — Z87891 Personal history of nicotine dependence: Secondary | ICD-10-CM | POA: Diagnosis not present

## 2017-10-13 DIAGNOSIS — Z96652 Presence of left artificial knee joint: Secondary | ICD-10-CM | POA: Diagnosis not present

## 2017-10-13 DIAGNOSIS — M6281 Muscle weakness (generalized): Secondary | ICD-10-CM | POA: Diagnosis not present

## 2017-10-13 DIAGNOSIS — J449 Chronic obstructive pulmonary disease, unspecified: Secondary | ICD-10-CM | POA: Diagnosis not present

## 2017-10-18 DIAGNOSIS — Z7982 Long term (current) use of aspirin: Secondary | ICD-10-CM | POA: Diagnosis not present

## 2017-10-18 DIAGNOSIS — R2689 Other abnormalities of gait and mobility: Secondary | ICD-10-CM | POA: Diagnosis not present

## 2017-10-18 DIAGNOSIS — M6281 Muscle weakness (generalized): Secondary | ICD-10-CM | POA: Diagnosis not present

## 2017-10-18 DIAGNOSIS — J449 Chronic obstructive pulmonary disease, unspecified: Secondary | ICD-10-CM | POA: Diagnosis not present

## 2017-10-18 DIAGNOSIS — Z87891 Personal history of nicotine dependence: Secondary | ICD-10-CM | POA: Diagnosis not present

## 2017-10-18 DIAGNOSIS — Z96652 Presence of left artificial knee joint: Secondary | ICD-10-CM | POA: Diagnosis not present

## 2017-10-20 DIAGNOSIS — R2689 Other abnormalities of gait and mobility: Secondary | ICD-10-CM | POA: Diagnosis not present

## 2017-10-20 DIAGNOSIS — Z87891 Personal history of nicotine dependence: Secondary | ICD-10-CM | POA: Diagnosis not present

## 2017-10-20 DIAGNOSIS — J449 Chronic obstructive pulmonary disease, unspecified: Secondary | ICD-10-CM | POA: Diagnosis not present

## 2017-10-20 DIAGNOSIS — Z7982 Long term (current) use of aspirin: Secondary | ICD-10-CM | POA: Diagnosis not present

## 2017-10-20 DIAGNOSIS — Z96652 Presence of left artificial knee joint: Secondary | ICD-10-CM | POA: Diagnosis not present

## 2017-10-20 DIAGNOSIS — M6281 Muscle weakness (generalized): Secondary | ICD-10-CM | POA: Diagnosis not present

## 2017-10-25 DIAGNOSIS — Z7982 Long term (current) use of aspirin: Secondary | ICD-10-CM | POA: Diagnosis not present

## 2017-10-25 DIAGNOSIS — M6281 Muscle weakness (generalized): Secondary | ICD-10-CM | POA: Diagnosis not present

## 2017-10-25 DIAGNOSIS — J449 Chronic obstructive pulmonary disease, unspecified: Secondary | ICD-10-CM | POA: Diagnosis not present

## 2017-10-25 DIAGNOSIS — Z96652 Presence of left artificial knee joint: Secondary | ICD-10-CM | POA: Diagnosis not present

## 2017-10-25 DIAGNOSIS — R2689 Other abnormalities of gait and mobility: Secondary | ICD-10-CM | POA: Diagnosis not present

## 2017-10-25 DIAGNOSIS — Z87891 Personal history of nicotine dependence: Secondary | ICD-10-CM | POA: Diagnosis not present

## 2017-10-27 DIAGNOSIS — M6281 Muscle weakness (generalized): Secondary | ICD-10-CM | POA: Diagnosis not present

## 2017-10-27 DIAGNOSIS — Z87891 Personal history of nicotine dependence: Secondary | ICD-10-CM | POA: Diagnosis not present

## 2017-10-27 DIAGNOSIS — R2689 Other abnormalities of gait and mobility: Secondary | ICD-10-CM | POA: Diagnosis not present

## 2017-10-27 DIAGNOSIS — J449 Chronic obstructive pulmonary disease, unspecified: Secondary | ICD-10-CM | POA: Diagnosis not present

## 2017-10-27 DIAGNOSIS — Z7982 Long term (current) use of aspirin: Secondary | ICD-10-CM | POA: Diagnosis not present

## 2017-10-27 DIAGNOSIS — Z96652 Presence of left artificial knee joint: Secondary | ICD-10-CM | POA: Diagnosis not present

## 2017-10-31 DIAGNOSIS — R2689 Other abnormalities of gait and mobility: Secondary | ICD-10-CM | POA: Diagnosis not present

## 2017-10-31 DIAGNOSIS — Z87891 Personal history of nicotine dependence: Secondary | ICD-10-CM | POA: Diagnosis not present

## 2017-10-31 DIAGNOSIS — M6281 Muscle weakness (generalized): Secondary | ICD-10-CM | POA: Diagnosis not present

## 2017-10-31 DIAGNOSIS — Z96652 Presence of left artificial knee joint: Secondary | ICD-10-CM | POA: Diagnosis not present

## 2017-10-31 DIAGNOSIS — J449 Chronic obstructive pulmonary disease, unspecified: Secondary | ICD-10-CM | POA: Diagnosis not present

## 2017-10-31 DIAGNOSIS — Z7982 Long term (current) use of aspirin: Secondary | ICD-10-CM | POA: Diagnosis not present

## 2017-11-08 DIAGNOSIS — M6281 Muscle weakness (generalized): Secondary | ICD-10-CM | POA: Diagnosis not present

## 2017-11-08 DIAGNOSIS — Z7982 Long term (current) use of aspirin: Secondary | ICD-10-CM | POA: Diagnosis not present

## 2017-11-08 DIAGNOSIS — R2689 Other abnormalities of gait and mobility: Secondary | ICD-10-CM | POA: Diagnosis not present

## 2017-11-08 DIAGNOSIS — J449 Chronic obstructive pulmonary disease, unspecified: Secondary | ICD-10-CM | POA: Diagnosis not present

## 2017-11-08 DIAGNOSIS — Z87891 Personal history of nicotine dependence: Secondary | ICD-10-CM | POA: Diagnosis not present

## 2017-11-08 DIAGNOSIS — Z96652 Presence of left artificial knee joint: Secondary | ICD-10-CM | POA: Diagnosis not present

## 2017-11-10 DIAGNOSIS — Z7982 Long term (current) use of aspirin: Secondary | ICD-10-CM | POA: Diagnosis not present

## 2017-11-10 DIAGNOSIS — R2689 Other abnormalities of gait and mobility: Secondary | ICD-10-CM | POA: Diagnosis not present

## 2017-11-10 DIAGNOSIS — Z96652 Presence of left artificial knee joint: Secondary | ICD-10-CM | POA: Diagnosis not present

## 2017-11-10 DIAGNOSIS — M6281 Muscle weakness (generalized): Secondary | ICD-10-CM | POA: Diagnosis not present

## 2017-11-10 DIAGNOSIS — Z87891 Personal history of nicotine dependence: Secondary | ICD-10-CM | POA: Diagnosis not present

## 2017-11-10 DIAGNOSIS — J449 Chronic obstructive pulmonary disease, unspecified: Secondary | ICD-10-CM | POA: Diagnosis not present

## 2017-11-15 DIAGNOSIS — Z87891 Personal history of nicotine dependence: Secondary | ICD-10-CM | POA: Diagnosis not present

## 2017-11-15 DIAGNOSIS — Z96652 Presence of left artificial knee joint: Secondary | ICD-10-CM | POA: Diagnosis not present

## 2017-11-15 DIAGNOSIS — R2689 Other abnormalities of gait and mobility: Secondary | ICD-10-CM | POA: Diagnosis not present

## 2017-11-15 DIAGNOSIS — J449 Chronic obstructive pulmonary disease, unspecified: Secondary | ICD-10-CM | POA: Diagnosis not present

## 2017-11-15 DIAGNOSIS — M6281 Muscle weakness (generalized): Secondary | ICD-10-CM | POA: Diagnosis not present

## 2017-11-15 DIAGNOSIS — Z7982 Long term (current) use of aspirin: Secondary | ICD-10-CM | POA: Diagnosis not present

## 2018-02-11 ENCOUNTER — Emergency Department (HOSPITAL_COMMUNITY): Payer: Medicare Other | Admitting: Certified Registered Nurse Anesthetist

## 2018-02-11 ENCOUNTER — Encounter (HOSPITAL_COMMUNITY)
Admission: EM | Disposition: A | Discharge status: Home / Self Care | Payer: Self-pay | Source: Home / Self Care | Attending: Emergency Medicine

## 2018-02-11 ENCOUNTER — Emergency Department (HOSPITAL_COMMUNITY)
Admission: EM | Admit: 2018-02-11 | Discharge: 2018-02-11 | Disposition: A | Discharge status: Home / Self Care | Payer: Medicare Other | Attending: Orthopedic Surgery | Admitting: Orthopedic Surgery

## 2018-02-11 ENCOUNTER — Emergency Department (HOSPITAL_COMMUNITY): Payer: Medicare Other

## 2018-02-11 ENCOUNTER — Other Ambulatory Visit: Payer: Self-pay

## 2018-02-11 ENCOUNTER — Encounter (HOSPITAL_COMMUNITY): Payer: Self-pay | Admitting: *Deleted

## 2018-02-11 DIAGNOSIS — M25562 Pain in left knee: Secondary | ICD-10-CM | POA: Diagnosis not present

## 2018-02-11 DIAGNOSIS — Z79899 Other long term (current) drug therapy: Secondary | ICD-10-CM | POA: Insufficient documentation

## 2018-02-11 DIAGNOSIS — Z7901 Long term (current) use of anticoagulants: Secondary | ICD-10-CM | POA: Diagnosis not present

## 2018-02-11 DIAGNOSIS — M25462 Effusion, left knee: Secondary | ICD-10-CM

## 2018-02-11 DIAGNOSIS — Z96651 Presence of right artificial knee joint: Secondary | ICD-10-CM | POA: Diagnosis not present

## 2018-02-11 DIAGNOSIS — Z87891 Personal history of nicotine dependence: Secondary | ICD-10-CM | POA: Insufficient documentation

## 2018-02-11 DIAGNOSIS — I10 Essential (primary) hypertension: Secondary | ICD-10-CM | POA: Insufficient documentation

## 2018-02-11 DIAGNOSIS — M2550 Pain in unspecified joint: Secondary | ICD-10-CM | POA: Diagnosis not present

## 2018-02-11 DIAGNOSIS — M25062 Hemarthrosis, left knee: Secondary | ICD-10-CM | POA: Diagnosis not present

## 2018-02-11 DIAGNOSIS — M1711 Unilateral primary osteoarthritis, right knee: Secondary | ICD-10-CM | POA: Diagnosis not present

## 2018-02-11 DIAGNOSIS — Z7401 Bed confinement status: Secondary | ICD-10-CM | POA: Diagnosis not present

## 2018-02-11 LAB — GRAM STAIN

## 2018-02-11 LAB — BASIC METABOLIC PANEL
Anion gap: 8 (ref 5–15)
BUN: 21 mg/dL (ref 8–23)
CALCIUM: 9.2 mg/dL (ref 8.9–10.3)
CHLORIDE: 109 mmol/L (ref 98–111)
CO2: 27 mmol/L (ref 22–32)
CREATININE: 0.92 mg/dL (ref 0.61–1.24)
GFR calc non Af Amer: >60 mL/min (ref >60)
Glucose, Bld: 150 mg/dL — ABNORMAL HIGH (ref 70–99)
Potassium: 3.9 mmol/L (ref 3.5–5.1)
SODIUM: 144 mmol/L (ref 135–145)

## 2018-02-11 LAB — SYNOVIAL CELL COUNT + DIFF, W/ CRYSTALS
CRYSTALS FLUID: NONE SEEN
CRYSTALS FLUID: NONE SEEN
CRYSTALS FLUID: NONE SEEN
EOSINOPHILS-SYNOVIAL: 4 % — AB (ref 0–1)
EOSINOPHILS-SYNOVIAL: 3 % — AB (ref 0–1)
Eosinophils-Synovial: 2 % — ABNORMAL HIGH (ref 0–1)
LYMPHOCYTES-SYNOVIAL FLD: 39 % — AB (ref 0–20)
LYMPHOCYTES-SYNOVIAL FLD: 39 % — AB (ref 0–20)
Lymphocytes-Synovial Fld: 47 % — ABNORMAL HIGH (ref 0–20)
MONOCYTE-MACROPHAGE-SYNOVIAL FLUID: 14 % — AB (ref 50–90)
MONOCYTE-MACROPHAGE-SYNOVIAL FLUID: 16 % — AB (ref 50–90)
MONOCYTE-MACROPHAGE-SYNOVIAL FLUID: 28 % — AB (ref 50–90)
Neutrophil, Synovial: 43 % — ABNORMAL HIGH (ref 0–25)
Neutrophil, Synovial: 31 % — ABNORMAL HIGH (ref 0–25)
Neutrophil, Synovial: 34 % — ABNORMAL HIGH (ref 0–25)
WBC, SYNOVIAL: 106 /mm3 (ref 0–200)
WBC, Synovial: 50 /mm3 (ref 0–200)
WBC, Synovial: 67 /mm3 (ref 0–200)

## 2018-02-11 LAB — CBC WITH DIFFERENTIAL/PLATELET
Basophils Absolute: 0 10*3/uL (ref 0.0–0.1)
Basophils Relative: 1 %
EOS ABS: 0.2 10*3/uL (ref 0.0–0.7)
EOS PCT: 3 %
HCT: 45 % (ref 39.0–52.0)
Hemoglobin: 14.8 g/dL (ref 13.0–17.0)
LYMPHS ABS: 1.3 10*3/uL (ref 0.7–4.0)
Lymphocytes Relative: 22 %
MCH: 31.7 pg (ref 26.0–34.0)
MCHC: 32.9 g/dL (ref 30.0–36.0)
MCV: 96.4 fL (ref 78.0–100.0)
MONOS PCT: 12 %
Monocytes Absolute: 0.7 10*3/uL (ref 0.1–1.0)
NEUTROS PCT: 62 %
Neutro Abs: 3.7 10*3/uL (ref 1.7–7.7)
PLATELETS: 202 10*3/uL (ref 150–400)
RBC: 4.67 MIL/uL (ref 4.22–5.81)
RDW: 13.3 % (ref 11.5–15.5)
WBC: 5.9 10*3/uL (ref 4.0–10.5)

## 2018-02-11 LAB — CBG MONITORING, ED: Glucose-Capillary: 115 mg/dL — ABNORMAL HIGH (ref 70–99)

## 2018-02-11 SURGERY — ARTHROSCOPY, KNEE
Anesthesia: Choice | Site: Knee | Laterality: Left

## 2018-02-11 MED ORDER — LIDOCAINE HCL (PF) 2 % IJ SOLN: 10.0000 mL | Freq: Once | INTRAMUSCULAR | Status: DC

## 2018-02-11 MED ORDER — LIDOCAINE HCL 2 % IJ SOLN
10.0000 mL | Freq: Once | INTRAMUSCULAR | Status: DC
  Filled 2018-02-11: qty 20

## 2018-02-11 MED ORDER — PROPOFOL 10 MG/ML IV BOLUS
INTRAVENOUS | Status: AC
  Filled 2018-02-11: qty 20

## 2018-02-11 MED ORDER — FENTANYL CITRATE (PF) 100 MCG/2ML IJ SOLN
INTRAMUSCULAR | Status: AC
  Filled 2018-02-11: qty 2

## 2018-02-11 MED ORDER — LIDOCAINE HCL 2 % IJ SOLN
10.0000 mL | Freq: Once | INTRAMUSCULAR | Status: AC
  Administered 2018-02-11: 200 mg
  Filled 2018-02-11: qty 20

## 2018-02-11 MED ORDER — LIDOCAINE HCL 1 % IJ SOLN
INTRAMUSCULAR | Status: AC
  Filled 2018-02-11: qty 20

## 2018-02-11 NOTE — ED Triage Notes (Signed)
Pt presents with 3 days of  left knee pain, pink colored and warm to touch. Pain without relief. Unable to place weight on ext.

## 2018-02-11 NOTE — ED Notes (Signed)
[facility] was contacted and on the way to pick up pt.

## 2018-02-11 NOTE — Consult Note (Addendum)
Orthopaedic Trauma Service H&P/Consult     Patient ID: William Skinner MRN: [id number] DOB/AGE: 1925-09-09 84 y.o.  Requesting Physician: Daleen Bo, MD Chief Complaint: Increasing right knee pain HPI: William Skinner is an 84 y.o. male.who underwent right TKA two years ago by Dr. Fredonia Highland, which is doing great, and a left TKA over a decade ago by Dr. Macario Carls. He has not ambulated for nearly a year but has been shuffling himself in a wheelchair using his feet for self propulsion. Over the last few days he has noticed increased pain and swelling in the left knee. Denies malaise, fever, recent dental work, infection, falls, or other complaint. Aspiration through a medial approach was performed by Dr. Daleen Bo that revealed only 106 white cells but also gram positive cocci. Consequently I was consulted for further evaluation.Pain is dull, worse with activity, alleviated somewhat by being still. Patient ate a hotdog at 1 pm after the first aspiration was performed.  Past Medical History:  Diagnosis Date  . Back pain   . Carotid artery occlusion   . Hyperlipidemia   . Hypertension   . Primary osteoarthritis of knee    right  . Right leg pain     Past Surgical History:  Procedure Laterality Date  . CAROTID ENDARTERECTOMY Left   . CATARACT EXTRACTION Bilateral   . COLONOSCOPY W/ BIOPSIES AND POLYPECTOMY    . HERNIA REPAIR    . KNEE SURGERY Left   . NOSE SURGERY    . TONSILLECTOMY    . TOTAL KNEE ARTHROPLASTY Right 06/15/2016   Procedure: RIGHT TOTAL KNEE ARTHROPLASTY;  Surgeon: Renette Butters, MD;  Location: [facility];  Service: Orthopedics;  Laterality: Right;    No family history on file. Social History:  reports that he quit smoking about 53 years ago. He has never used smokeless tobacco. He reports that he drinks alcohol. He reports that he does not use drugs.  Allergies: No Known Allergies   (Not in a hospital admission)  Results for orders placed or performed during the  hospital encounter of 02/11/18 (from the past 48 hour(s))  CBG monitoring, ED     Status: Abnormal   Collection Time: 02/11/18 11:57 AM  Result Value Ref Range   Glucose-Capillary 115 (H) 70 - 99 mg/dL  Body fluid culture     Status: None (Preliminary result)   Collection Time: 02/11/18 12:15 PM  Result Value Ref Range   Specimen Description      KNEE LEFT Performed at [facility] [address], [place], [place] [address]    Special Requests      NONE Performed at [facility], [place] [address], [place], [place] [address]    Gram Stain PENDING    Culture PENDING    Report Status PENDING   Gram stain     Status: None   Collection Time: 02/11/18 12:15 PM  Result Value Ref Range   Specimen Description KNEE LEFT    Special Requests NONE    Gram Stain      RARE WBC SEEN FEW GRAM POSITIVE COCCI Gram Stain Report Called to,Read Back By and Verified With: CLAPP,S AT [place] ON [id number] BY HOOKER,B Performed at [facility] [address], [place], [place] [address]    Report Status 02/11/2018 FINAL   Cell count + diff,  w/ cryst-synvl fld     Status: Abnormal   Collection Time: 02/11/18 12:15 PM  Result Value Ref Range  Color, Synovial RED (A) YELLOW   Appearance-Synovial TURBID (A) CLEAR   Crystals, Fluid NO CRYSTALS SEEN    WBC, Synovial 106 0 - 200 /cu mm   Neutrophil, Synovial 43 (H) 0 - 25 %   Lymphocytes-Synovial Fld 39 (H) 0 - 20 %   Monocyte-Macrophage-Synovial Fluid 14 (L) 50 - 90 %   Eosinophils-Synovial 4 (H) 0 - 1 %    Comment: Performed at [facility], [place] [address], [place], [place] [address]  Basic metabolic panel     Status: Abnormal   Collection Time: 02/11/18  2:44 PM  Result Value Ref Range   Sodium 144 135 - 145 mmol/L   Potassium 3.9 3.5 - 5.1 mmol/L   Chloride 109 98 - 111 mmol/L   CO2 27 22 - 32 mmol/L   Glucose, Bld 150 (H) 70 - 99 mg/dL   BUN 21 8 - 23 mg/dL   Creatinine, Ser 0.92 0.61  - 1.24 mg/dL   Calcium 9.2 8.9 - 10.3 mg/dL   GFR calc non Af Amer >60 >60 mL/min   GFR calc Af Amer >60 >60 mL/min    Comment: (NOTE) The eGFR has been calculated using the CKD EPI equation. This calculation has not been validated in all clinical situations. eGFR's persistently <60 mL/min signify possible Chronic Kidney Disease.    Anion gap 8 5 - 15    Comment: Performed at [facility], [place] [address] [place], [place] [address]  CBC with Differential     Status: None   Collection Time: 02/11/18  2:44 PM  Result Value Ref Range   WBC 5.9 4.0 - 10.5 K/uL   RBC 4.67 4.22 - 5.81 MIL/uL   Hemoglobin 14.8 13.0 - 17.0 g/dL   HCT 45.0 39.0 - 52.0 %   MCV 96.4 78.0 - 100.0 fL   MCH 31.7 26.0 - 34.0 pg   MCHC 32.9 30.0 - 36.0 g/dL   RDW 13.3 11.5 - 15.5 %   Platelets 202 150 - 400 K/uL   Neutrophils Relative % 62 %   Neutro Abs 3.7 1.7 - 7.7 K/uL   Lymphocytes Relative 22 %   Lymphs Abs 1.3 0.7 - 4.0 K/uL   Monocytes Relative 12 %   Monocytes Absolute 0.7 0.1 - 1.0 K/uL   Eosinophils Relative 3 %   Eosinophils Absolute 0.2 0.0 - 0.7 K/uL   Basophils Relative 1 %   Basophils Absolute 0.0 0.0 - 0.1 K/uL    Comment: Performed at [facility], [place] [address] [place], [place] [address]   Dg Knee Complete 4 Views Left  Result Date: 02/11/2018 CLINICAL DATA:  Acute LEFT knee pain and swelling for 3 days. EXAM: LEFT KNEE - COMPLETE 4+ VIEW COMPARISON:  None. FINDINGS: Distal femoral arthroplasty hardware noted with prior surgical changes in the proximal tibia. A knee effusion is present. No acute fracture or dislocation. No definite hardware complicating features noted. IMPRESSION: Knee effusion without acute bony abnormality. Electronically Signed   By: Margarette Canada M.D.   On: 02/11/2018 11:05    ROS No recent fever, bleeding abnormalities, urologic dysfunction, GI problems, or weight gain. As above.  Blood pressure (!) 145/76, pulse 74, temperature  (!) 97.4 F (36.3 C), temperature source Oral, resp. rate 16, height _0  (1.803 m), weight 88.5 kg (195 lb), SpO2 94 %. Physical Exam NCAT RRR No audible wheezing or retractions S/NT/ND LLE 3 small petechia anteriorly; medial aspiration wound; no erythema laterally,  minimal in spots medially and anteriorly, midline scar without any concern  mildly tender with knee effusion  Knee stable to varus/ valgus and anterior/posterior stress with 20 degree flexion contracture  Sens DPN, SPN, TN intact  Motor EHL, ext, flex, evers 5/5  DP palp, No significant edema RLE No traumatic wounds, ecchymosis, or rash; midline incision, 20 degree flexion contracture  Nontender  No knee or ankle effusion  Knee stable to varus/ valgus and anterior/posterior stress  Sens DPN, SPN, TN intact  Motor EHL, ext, flex, evers 5/5  DP palp, No significant edema   Assessment/Plan 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 I spoke directly with lab personnel, Dr. Fredonia Highland, and Dr. Eulis Foster, in addition to [facility] staff to coordinate care. His white count can cell count argue against infection which fits his other lab values and for the most part his history that does not include malaise or fever or significant redness. On the other hand, gram positive cocci should not be present in the aspiration. At 84 years of age and cerebrovascular disease perioperative complications are not inconsequential. I also spoke with my total joint surgeon colleagues regarding lab test called synasure, but in subsequent conference with Dr. Eulis Foster this test is currently not available here.  In the end, we agreed to repeat aspiration of the left knee.  PROCEDURE:LEFT KNEE ASPIRATION LOCAL ANESTHESIA WITH 1% XYLOCAINE WITHOUT EPI FINDINGS: I removed 71 cc of hematoma, no evidence of purulence. Dramatic decrease in joint volume.  Patient did not experience pain. SPECIMENS: TWO 20 CC TUBES WERE SENT TO LAB FOR GRAM STAIN, CC, AND CULTURE COMPLICATIONS: NONE   We will wait for the results of these studies with plans for either discharge or surgical lavage. I have discussed these options for treatment with the patient, his son, and their private nurse.   OR has been updated.  [place], MD [facility], [facility] [phone] 02/11/2018, 4:22 PM

## 2018-02-11 NOTE — ED Notes (Signed)
Lab result for gram stain: Few white cells no organism seen on both samples

## 2018-02-11 NOTE — ED Notes (Signed)
Bed: GQ36 Expected date:  Expected time:  Means of arrival:  Comments: Held: Knee swelling

## 2018-02-11 NOTE — ED Notes (Signed)
Billy from lab called to report  Rare WBC's  Few gram + cocci  In fluid from knee. Sample is being sent to [facility] for culture. Almond Lint made aware

## 2018-02-11 NOTE — ED Notes (Signed)
Due to pt needing sit on toilet to urinate, pt agreed to condom cath for comfort of not having to stand.

## 2018-02-11 NOTE — ED Provider Notes (Addendum)
[place] [facility] DEPT Provider Note   CSN: [id number] Arrival date & time: 02/11/18  1003     History   Chief Complaint Chief Complaint  Patient presents with  . Knee Pain    HPI William Skinner is a 84 y.o. male.  HPI   Patient presents for evaluation of left knee pain with swelling present for 3 days, causing difficulty walking, while at his assisted living facility.  No known trauma.  No recent fever, chills, nausea, vomiting, focal weakness or paresthesia.  According to his daughter, he has required an increase of his assist from 1-2 person, to help him walk.  No other recent problems with the left knee.  He is status post "total left knee and partial right knee," surgeries.  There are no other known modifying factors.  Past Medical History:  Diagnosis Date  . Back pain   . Carotid artery occlusion   . Hyperlipidemia   . Hypertension   . Primary osteoarthritis of knee    right  . Right leg pain     Patient Active Problem List   Diagnosis Date Noted  . H/O carotid endarterectomy 06/01/2016  . Primary osteoarthritis of right knee 05/31/2016  . Essential hypertension 05/31/2016  . H/O spinal stenosis 05/31/2016  . Pain in joint, lower leg 03/06/2014    Past Surgical History:  Procedure Laterality Date  . CAROTID ENDARTERECTOMY Left   . CATARACT EXTRACTION Bilateral   . COLONOSCOPY W/ BIOPSIES AND POLYPECTOMY    . HERNIA REPAIR    . KNEE SURGERY Left   . NOSE SURGERY    . TONSILLECTOMY    . TOTAL KNEE ARTHROPLASTY Right 06/15/2016   Procedure: RIGHT TOTAL KNEE ARTHROPLASTY;  Surgeon: Renette Butters, MD;  Location: [facility];  Service: Orthopedics;  Laterality: Right;        Home Medications    Prior to Admission medications   Medication Sig Start Date End Date Taking? Authorizing Provider  acetaminophen (TYLENOL) 500 MG tablet Take 500 mg by mouth every 6 (six) hours as needed for moderate pain or headache. Reported on 07/29/2015     [provider]  aspirin 81 MG tablet Take 81 mg by mouth daily.     [provider]  baclofen (LIORESAL) 10 MG tablet Take 1 tablet (10 mg total) by mouth 3 (three) times daily as needed for muscle spasms. 06/15/16   Prudencio Burly III, PA-C  cephALEXin (KEFLEX) 500 MG capsule Take 1 capsule (500 mg total) by mouth 3 (three) times daily. 08/19/16   Tanna Furry, MD  cholecalciferol (VITAMIN D) 1000 units tablet Take 1,000 Units by mouth daily.    [provider]  clobetasol cream (TEMOVATE) 7.78 % Apply 1 application topically 2 (two) times daily. Apply daily or bid to Bilteral lower extremity rash. 08/19/16   Tanna Furry, MD  clobetasol cream (TEMOVATE) 2.42 % Apply 1 application topically 2 (two) times daily as needed.    [provider]  Coenzyme Q10 (CO Q 10) 100 MG CAPS Take 1 capsule by mouth daily.    [provider]  docusate sodium (COLACE) 100 MG capsule Take 1 capsule (100 mg total) by mouth 2 (two) times daily. 06/15/16   Prudencio Burly III, PA-C  HYDROcodone-acetaminophen (NORCO) 5-325 MG tablet Take 1-2 tablets by mouth every 4 (four) hours as needed for moderate pain. 06/15/16   Prudencio Burly III, PA-C  loperamide (IMODIUM A-D) 2 MG tablet Take 2 mg  by mouth 4 (four) times daily as needed for diarrhea or loose stools.    [provider]  losartan (COZAAR) 100 MG tablet Take 100 mg by mouth daily.    [provider]  meloxicam (MOBIC) 15 MG tablet Take 15 mg by mouth daily.  04/29/16   [provider]  omeprazole (PRILOSEC) 20 MG capsule Take 20 mg by mouth daily.  05/31/16   [provider]  ondansetron (ZOFRAN) 4 MG tablet Take 1 tablet (4 mg total) by mouth every 8 (eight) hours as needed for nausea or vomiting. 06/15/16   Prudencio Burly III, PA-C  rivaroxaban (XARELTO) 10 MG TABS tablet Take 1 tablet (10 mg total) by mouth daily. Patient not taking: Reported on 08/19/2016  06/15/16   Prudencio Burly III, PA-C  selenium 50 MCG TABS tablet Take 50 mcg by mouth daily.    [provider]  tamsulosin (FLOMAX) 0.4 MG CAPS capsule Take 0.8 mg by mouth daily.  03/12/16   [provider]  vitamin C (ASCORBIC ACID) 500 MG tablet Take 500 mg by mouth daily.    [provider]  vitamin E 100 UNIT capsule Take 200 Units by mouth daily.    [provider]  vitamin E 400 UNIT capsule Take 400 Units by mouth daily.    [provider]  Zinc 30 MG CAPS Take 30 mg by mouth daily.    [provider]    Family History No family history on file.  Social History Social History   Tobacco Use  . Smoking status: Former Smoker    Last attempt to quit: 03/06/1964    Years since quitting: 53.9  . Smokeless tobacco: Never Used  Substance Use Topics  . Alcohol use: Yes    Comment: occasional beer  . Drug use: No     Allergies   Patient has no known allergies.   Review of Systems Review of Systems  All other systems reviewed and are negative.    Physical Exam Updated Vital Signs BP (!) 141/59 (BP Location: Right Wrist)   Pulse 80   Temp 97.8 F (36.6 C) (Oral)   Resp 17   Ht 5\' 11"  (1.803 m)   Wt 88.5 kg (195 lb)   SpO2 94%   BMI 27.20 kg/m   Physical Exam  Constitutional: He is oriented to person, place, and time. He appears well-developed. He appears distressed (He is uncomfortable).  Elderly, frail  HENT:  Head: Normocephalic and atraumatic.  Right Ear: External ear normal.  Left Ear: External ear normal.  Eyes: Pupils are equal, round, and reactive to light. Conjunctivae and EOM are normal.  Neck: Normal range of motion and phonation normal. Neck supple.  Cardiovascular: Normal rate.  Pulmonary/Chest: Effort normal. He exhibits no bony tenderness.  Abdominal: There is tenderness.  Musculoskeletal:  Unable to straight leg raise left knee secondary to pain.  Patella tendon feels intact with  attempt at extension.  Large left knee effusion is present with very minimal overlying erythema.  Left knee grossly stable.  Neurological: He is alert and oriented to person, place, and time. No cranial nerve deficit or sensory deficit. He exhibits normal muscle tone. Coordination normal.  Skin: Skin is warm, dry and intact.  Psychiatric: He has a normal mood and affect. His behavior is normal. Judgment and thought content normal.  Nursing note and vitals reviewed.    ED Treatments / Results  Labs (all labs ordered are listed, but only  abnormal results are displayed) Labs Reviewed  SYNOVIAL CELL COUNT + DIFF, W/ CRYSTALS - Abnormal; Notable for the following components:      Result Value   Color, Synovial RED (*)    Appearance-Synovial TURBID (*)    Neutrophil, Synovial 43 (*)    Lymphocytes-Synovial Fld 39 (*)    Monocyte-Macrophage-Synovial Fluid 14 (*)    Eosinophils-Synovial 4 (*)    All other components within normal limits  BASIC METABOLIC PANEL - Abnormal; Notable for the following components:   Glucose, Bld 150 (*)    All other components within normal limits  CBG MONITORING, ED - Abnormal; Notable for the following components:   Glucose-Capillary 115 (*)    All other components within normal limits  BODY FLUID CULTURE  GRAM STAIN  CBC WITH DIFFERENTIAL/PLATELET  GLUCOSE, BODY FLUID OTHER    EKG None  Radiology Dg Knee Complete 4 Views Left  Result Date: 02/11/2018 CLINICAL DATA:  Acute LEFT knee pain and swelling for 3 days. EXAM: LEFT KNEE - COMPLETE 4+ VIEW COMPARISON:  None. FINDINGS: Distal femoral arthroplasty hardware noted with prior surgical changes in the proximal tibia. A knee effusion is present. No acute fracture or dislocation. No definite hardware complicating features noted. IMPRESSION: Knee effusion without acute bony abnormality. Electronically Signed   By: Margarette Canada M.D.   On: 02/11/2018 11:05    Procedures Aspiration of  blood/fluid Date/Time: 02/11/2018 2:53 PM Performed by: Daleen Bo, MD Authorized by: Daleen Bo, MD  Consent: Verbal consent obtained. Risks and benefits: risks, benefits and alternatives were discussed Consent given by: patient (And daughter) Patient understanding: patient does not state understanding of the procedure being performed Patient consent: the patient's understanding of the procedure does not match consent given Procedure consent: procedure consent does not match procedure scheduled Test results: test results available and properly labeled Site marked: the operative site was marked Imaging studies: imaging studies available Patient identity confirmed: verbally with patient Time out: Immediately prior to procedure a "time out" was called to verify the correct patient, procedure, equipment, support staff and site/side marked as required. Preparation: Patient was prepped and draped in the usual sterile fashion. Local anesthesia used: yes Anesthesia: local infiltration  Anesthesia: Local anesthesia used: yes Local Anesthetic: lidocaine 1% without epinephrine Anesthetic total: 3 mL  Sedation: Patient sedated: no  Patient tolerance: Patient tolerated the procedure well with no immediate complications Comments: Obtained 25 cc of bloody appearing slightly viscous fluid.  Needle track broken up by massaging area.  Bleeding controlled with pressure with a sterile gauze, until the bleeding was controlled, then a bandage was applied.  Ace wrap ordered     This procedure was not done with ultrasound imaging.   (including critical care time)  Medications Ordered in ED Medications  lidocaine (XYLOCAINE) 2 % (with pres) injection 200 mg (200 mg Other Given 02/11/18 1154)     Initial Impression / Assessment and Plan / ED Course  I have reviewed the triage vital signs and the nursing notes.  Pertinent labs & imaging results that were available during my care of the  patient were reviewed by me and considered in my medical decision making (see chart for details).  Clinical Course as of Feb 12 1648  Sat Feb 11, 2018  1456 Abnormal, red color, turbid appearance, increased neutrophils and lymphocytes  Cell count + diff,  w/ cryst-synvl fld(!) [EW]  1456 Abnormal, rare white cells and few gram-positive cocci  Gram stain [EW]  1647 Normal except  elevated glucose  Basic metabolic panel(!) [EW]  6767 Normal  CBC with Differential [EW]  1648 No crystals present.  White count 106 nonspecific.  Cell count + diff,  w/ cryst-synvl fld(!) [EW]    Clinical Course User Index [EW] Daleen Bo, MD     Patient Vitals for the past 24 hrs:  BP Temp Temp src Pulse Resp SpO2 Height Weight  02/11/18 1600 (!) 141/59 97.8 F (36.6 C) Oral 80 17 94 % - -  02/11/18 1200 (!) 145/76 - - - - - - -  02/11/18 1017 - - - - - - 5\' 11"  (1.803 m) 88.5 kg (195 lb)  02/11/18 1011 (!) 126/59 (!) 97.4 F (36.3 C) Oral 74 16 94 % - -    2:55 PM Reevaluation with update and discussion. After initial assessment and treatment, an updated evaluation reveals no further complaints, he remains comfortable, patient's daughter updated on findings. Daleen Bo   Medical Decision Making: Knee pain and swelling with bloody effusion.  Gram stain positive with cocci indicating infection.  Patient to be admitted by orthopedic services and will likely have orthopedic surgical intervention by the irrigation of the operative theater.  Doubt systemic infection.  Doubt metabolic instability.  CRITICAL CARE-no Performed by: Daleen Bo   Nursing Notes Reviewed/ Care Coordinated Applicable Imaging Reviewed Interpretation of Laboratory Data incorporated into ED treatment   Plan: Admit    Final Clinical Impressions(s) / ED Diagnoses   Final diagnoses:  None    ED Discharge Orders    None       Daleen Bo, MD 02/11/18 1650    Daleen Bo, MD 02/20/18 [phone]

## 2018-02-11 NOTE — ED Notes (Signed)
Pressure held to site of aspiration, bandaid applied then 4x4's followed by ace wrap.

## 2018-02-11 NOTE — ED Notes (Addendum)
Santiago Glad from OR was calling to verify surgery for pt. Pt's last cultures have not been resulted yet. According to the last  note by Dr. Marcelino Scot, we are awaiting results before any decision is made. Santiago Glad would like to be contacted at [phone] when lab results are posted.

## 2018-02-11 NOTE — ED Notes (Addendum)
Report received from Dr. Marcelino Scot. Pt is okay to discharge. Made Anmaire, pt's daughter, aware of negative lab results. Will contact [facility] to transport back to [place] at [facility].

## 2018-02-12 LAB — GRAM STAIN

## 2018-02-15 DIAGNOSIS — M1711 Unilateral primary osteoarthritis, right knee: Secondary | ICD-10-CM | POA: Diagnosis not present

## 2018-02-15 LAB — BODY FLUID CULTURE
Culture: NO GROWTH
Culture: NO GROWTH

## 2018-02-16 ENCOUNTER — Other Ambulatory Visit: Payer: Self-pay

## 2018-02-16 ENCOUNTER — Telehealth: Payer: Self-pay | Admitting: Emergency Medicine

## 2018-02-16 ENCOUNTER — Encounter (HOSPITAL_COMMUNITY): Payer: Self-pay | Admitting: Emergency Medicine

## 2018-02-16 ENCOUNTER — Emergency Department (HOSPITAL_COMMUNITY)
Admission: EM | Admit: 2018-02-16 | Discharge: 2018-02-16 | Disposition: A | Discharge status: Home / Self Care | Payer: Medicare Other | Attending: Emergency Medicine | Admitting: Emergency Medicine

## 2018-02-16 DIAGNOSIS — W19XXXA Unspecified fall, initial encounter: Secondary | ICD-10-CM

## 2018-02-16 DIAGNOSIS — Z7901 Long term (current) use of anticoagulants: Secondary | ICD-10-CM | POA: Insufficient documentation

## 2018-02-16 DIAGNOSIS — I10 Essential (primary) hypertension: Secondary | ICD-10-CM | POA: Diagnosis not present

## 2018-02-16 DIAGNOSIS — M2550 Pain in unspecified joint: Secondary | ICD-10-CM | POA: Diagnosis not present

## 2018-02-16 DIAGNOSIS — Z79899 Other long term (current) drug therapy: Secondary | ICD-10-CM | POA: Insufficient documentation

## 2018-02-16 DIAGNOSIS — Z87891 Personal history of nicotine dependence: Secondary | ICD-10-CM | POA: Insufficient documentation

## 2018-02-16 DIAGNOSIS — H10022 Other mucopurulent conjunctivitis, left eye: Secondary | ICD-10-CM | POA: Insufficient documentation

## 2018-02-16 DIAGNOSIS — W06XXXA Fall from bed, initial encounter: Secondary | ICD-10-CM | POA: Diagnosis not present

## 2018-02-16 DIAGNOSIS — M5489 Other dorsalgia: Secondary | ICD-10-CM | POA: Diagnosis not present

## 2018-02-16 DIAGNOSIS — R6889 Other general symptoms and signs: Secondary | ICD-10-CM | POA: Diagnosis not present

## 2018-02-16 DIAGNOSIS — Z7401 Bed confinement status: Secondary | ICD-10-CM | POA: Diagnosis not present

## 2018-02-16 LAB — BODY FLUID CULTURE: Culture: NO GROWTH

## 2018-02-16 MED ORDER — ACETAMINOPHEN 325 MG PO TABS
650.0000 mg | ORAL_TABLET | Freq: Once | ORAL | Status: AC
  Administered 2018-02-16: 650 mg via ORAL
  Filled 2018-02-16: qty 2

## 2018-02-16 MED ORDER — TOBRAMYCIN 0.3 % OP SOLN
2.0000 [drp] | Freq: Once | OPHTHALMIC | Status: AC
  Administered 2018-02-16: 2 [drp] via OPHTHALMIC
  Filled 2018-02-16: qty 5

## 2018-02-16 NOTE — Telephone Encounter (Signed)
CM contacted by pt's daughter, Catalina Lunger, who advised that Dr. Ashok Cordia placed a noted on pt's AVS that he needed a hospital bed with rails.  CM noted pt had left to long ago to add an order from the ED encounter.  Spoke with Dr. Ashok Cordia who was unable to create an orders only encounter.  CM spoke with Catalina Lunger who states she will speak with pt's PT person who can get in contact with Dr. Harrington Challenger for the order.  No further CM needs noted at this time.

## 2018-02-16 NOTE — ED Triage Notes (Signed)
Patient was transferring from wheelchair to bed and fell against bed and slid to floor. Was sitting there for many hours and is complaining of back pain. No blood thinners.

## 2018-02-16 NOTE — Discharge Instructions (Addendum)
It was our pleasure to provide your ER care today - we hope that you feel better.  Fall precautions. Please also ensure that patients necklace alarm/fall device is working, and with good battery.   Patient would also benefit from hospital bed with rails.   Follow up with home health PT and orthopedist as arranged.   For eye redness/discharge, use tobrex eye drops 1-2 drops in left eye 4x/day for 5 days.   Return to ER if worse, new symptoms, fevers, trouble breathing, new or severe pain, other concern.

## 2018-02-16 NOTE — ED Notes (Signed)
Bed: QH22 Expected date:  Expected time:  Means of arrival:  Comments: EMS 84 yo back pain.

## 2018-02-16 NOTE — ED Provider Notes (Addendum)
[place] [facility] DEPT Provider Note   CSN: [id number] Arrival date & time: 02/16/18  0856     History   Chief Complaint Chief Complaint  Patient presents with  . Fall    HPI William Skinner is a 84 y.o. male.  Patient presents s/p mechanical fall. Patient indicates at his current baseline, he is wheelchair during day, shuffles around using feet, transfer to bed. States was transferring to bed and ending up slowing sliding down along side of bed to floor. No loc. No head injury. Was unable to get back up under own power. States has med assist device/call necklace, but it didn't appear to work. Patient denies pain/injury from fall. No headache. No nv. No neck or back pain. No extremity pain or injury.   The history is provided by the patient and the EMS personnel.  Fall  Pertinent negatives include no chest pain, no abdominal pain, no headaches and no shortness of breath.    Past Medical History:  Diagnosis Date  . Back pain   . Carotid artery occlusion   . Hyperlipidemia   . Hypertension   . Primary osteoarthritis of knee    right  . Right leg pain     Patient Active Problem List   Diagnosis Date Noted  . H/O carotid endarterectomy 06/01/2016  . Primary osteoarthritis of right knee 05/31/2016  . Essential hypertension 05/31/2016  . H/O spinal stenosis 05/31/2016  . Pain in joint, lower leg 03/06/2014    Past Surgical History:  Procedure Laterality Date  . CAROTID ENDARTERECTOMY Left   . CATARACT EXTRACTION Bilateral   . COLONOSCOPY W/ BIOPSIES AND POLYPECTOMY    . HERNIA REPAIR    . KNEE SURGERY Left   . NOSE SURGERY    . TONSILLECTOMY    . TOTAL KNEE ARTHROPLASTY Right 06/15/2016   Procedure: RIGHT TOTAL KNEE ARTHROPLASTY;  Surgeon: Renette Butters, MD;  Location: [facility];  Service: Orthopedics;  Laterality: Right;        Home Medications    Prior to Admission medications   Medication Sig Start Date End Date Taking? Authorizing  Provider  acetaminophen (TYLENOL) 500 MG tablet Take 500 mg by mouth every 6 (six) hours as needed for moderate pain or headache. Reported on 07/29/2015    [provider]  aspirin 81 MG tablet Take 81 mg by mouth daily.     [provider]  baclofen (LIORESAL) 10 MG tablet Take 1 tablet (10 mg total) by mouth 3 (three) times daily as needed for muscle spasms. 06/15/16   Prudencio Burly III, PA-C  cephALEXin (KEFLEX) 500 MG capsule Take 1 capsule (500 mg total) by mouth 3 (three) times daily. 08/19/16   Tanna Furry, MD  cholecalciferol (VITAMIN D) 1000 units tablet Take 1,000 Units by mouth daily.    [provider]  clobetasol cream (TEMOVATE) 7.32 % Apply 1 application topically 2 (two) times daily. Apply daily or bid to Bilteral lower extremity rash. 08/19/16   Tanna Furry, MD  clobetasol cream (TEMOVATE) 2.02 % Apply 1 application topically 2 (two) times daily as needed.    [provider]  Coenzyme Q10 (CO Q 10) 100 MG CAPS Take 1 capsule by mouth daily.    [provider]  docusate sodium (COLACE) 100 MG capsule Take 1 capsule (100 mg total) by mouth 2 (two) times daily. 06/15/16   Prudencio Burly III, PA-C  HYDROcodone-acetaminophen (NORCO) 5-325 MG tablet Take 1-2 tablets by mouth  every 4 (four) hours as needed for moderate pain. 06/15/16   Prudencio Burly III, PA-C  loperamide (IMODIUM A-D) 2 MG tablet Take 2 mg by mouth 4 (four) times daily as needed for diarrhea or loose stools.    [provider]  losartan (COZAAR) 100 MG tablet Take 100 mg by mouth daily.    [provider]  meloxicam (MOBIC) 15 MG tablet Take 15 mg by mouth daily.  04/29/16   [provider]  omeprazole (PRILOSEC) 20 MG capsule Take 20 mg by mouth daily.  05/31/16   [provider]  ondansetron (ZOFRAN) 4 MG tablet Take 1 tablet (4 mg total) by mouth every 8 (eight) hours as needed for nausea or vomiting. 06/15/16    Prudencio Burly III, PA-C  rivaroxaban (XARELTO) 10 MG TABS tablet Take 1 tablet (10 mg total) by mouth daily. Patient not taking: Reported on 08/19/2016 06/15/16   Prudencio Burly III, PA-C  selenium 50 MCG TABS tablet Take 50 mcg by mouth daily.    [provider]  tamsulosin (FLOMAX) 0.4 MG CAPS capsule Take 0.8 mg by mouth daily.  03/12/16   [provider]  vitamin C (ASCORBIC ACID) 500 MG tablet Take 500 mg by mouth daily.    [provider]  vitamin E 100 UNIT capsule Take 200 Units by mouth daily.    [provider]  vitamin E 400 UNIT capsule Take 400 Units by mouth daily.    [provider]  Zinc 30 MG CAPS Take 30 mg by mouth daily.    [provider]    Family History History reviewed. No pertinent family history.  Social History Social History   Tobacco Use  . Smoking status: Former Smoker    Last attempt to quit: 03/06/1964    Years since quitting: 53.9  . Smokeless tobacco: Never Used  Substance Use Topics  . Alcohol use: Yes    Comment: occasional beer  . Drug use: No     Allergies   Patient has no known allergies.   Review of Systems Review of Systems  Constitutional: Negative for fever.  HENT: Negative for nosebleeds.   Eyes: Negative for visual disturbance.  Respiratory: Negative for shortness of breath.   Cardiovascular: Negative for chest pain.  Gastrointestinal: Negative for abdominal pain and vomiting.  Genitourinary: Negative for flank pain.  Musculoskeletal: Negative for back pain and neck pain.  Skin: Negative for wound.  Neurological: Negative for headaches.  Hematological: Does not bruise/bleed easily.  Psychiatric/Behavioral: The patient is not nervous/anxious.      Physical Exam Updated Vital Signs BP (!) 150/86 (BP Location: Right Arm)   Pulse 75   Temp 97.6 F (36.4 C) (Oral)   Resp 16   SpO2 96%   Physical Exam  Constitutional: He is oriented to person, place,  and time. He appears well-developed and well-nourished.  HENT:  Head: Atraumatic.  Mouth/Throat: Oropharynx is clear and moist.  Eyes: Pupils are equal, round, and reactive to light. Left eye exhibits discharge.  Left conjunctivitis  Neck: Normal range of motion. Neck supple. No tracheal deviation present.  Cardiovascular: Normal rate, regular rhythm, normal heart sounds and intact distal pulses.  Pulmonary/Chest: Effort normal and breath sounds normal. No accessory muscle usage. No respiratory distress. He exhibits no tenderness.  Abdominal: Soft. He exhibits no distension. There is no tenderness.  Musculoskeletal: He exhibits no edema.  CTLS spine, non tender, aligned, no step off. Good rom bil extremities  without pain or focal bony tenderness. Specifically, knees without erythema, increased warmth or large effusion.   Neurological: He is alert and oriented to person, place, and time.  Skin: Skin is warm and dry.  Psychiatric: He has a normal mood and affect.  Nursing note and vitals reviewed.    ED Treatments / Results  Labs (all labs ordered are listed, but only abnormal results are displayed) Labs Reviewed - No data to display  EKG None  Radiology No results found.  Procedures Procedures (including critical care time)  Medications Ordered in ED Medications  acetaminophen (TYLENOL) tablet 650 mg (has no administration in time range)     Initial Impression / Assessment and Plan / ED Course  I have reviewed the triage vital signs and the nursing notes.  Pertinent labs & imaging results that were available during my care of the patient were reviewed by me and considered in my medical decision making (see chart for details).  Reviewed nursing notes and prior charts for additional history.   No sign of significant acute injury noted.   Acetaminophen po. Po fluids/food.  Patient currently appears stable for d/c.   Final Clinical Impressions(s) / ED Diagnoses    Final diagnoses:  None    ED Discharge Orders    None           Lajean Saver, MD 02/16/18 1051

## 2018-02-17 LAB — ANAEROBIC CULTURE

## 2018-02-23 DIAGNOSIS — R296 Repeated falls: Secondary | ICD-10-CM | POA: Diagnosis not present

## 2018-02-23 DIAGNOSIS — M6281 Muscle weakness (generalized): Secondary | ICD-10-CM | POA: Diagnosis not present

## 2018-02-23 DIAGNOSIS — R2681 Unsteadiness on feet: Secondary | ICD-10-CM | POA: Diagnosis not present

## 2018-02-23 DIAGNOSIS — M1711 Unilateral primary osteoarthritis, right knee: Secondary | ICD-10-CM | POA: Diagnosis not present

## 2018-02-23 DIAGNOSIS — Z96651 Presence of right artificial knee joint: Secondary | ICD-10-CM | POA: Diagnosis not present

## 2018-02-23 DIAGNOSIS — Z471 Aftercare following joint replacement surgery: Secondary | ICD-10-CM | POA: Diagnosis not present

## 2018-02-24 DIAGNOSIS — Z471 Aftercare following joint replacement surgery: Secondary | ICD-10-CM | POA: Diagnosis not present

## 2018-02-24 DIAGNOSIS — M1711 Unilateral primary osteoarthritis, right knee: Secondary | ICD-10-CM | POA: Diagnosis not present

## 2018-02-24 DIAGNOSIS — M6281 Muscle weakness (generalized): Secondary | ICD-10-CM | POA: Diagnosis not present

## 2018-02-24 DIAGNOSIS — R296 Repeated falls: Secondary | ICD-10-CM | POA: Diagnosis not present

## 2018-02-24 DIAGNOSIS — Z96651 Presence of right artificial knee joint: Secondary | ICD-10-CM | POA: Diagnosis not present

## 2018-02-24 DIAGNOSIS — R2681 Unsteadiness on feet: Secondary | ICD-10-CM | POA: Diagnosis not present

## 2018-02-27 DIAGNOSIS — Z96651 Presence of right artificial knee joint: Secondary | ICD-10-CM | POA: Diagnosis not present

## 2018-02-27 DIAGNOSIS — M1711 Unilateral primary osteoarthritis, right knee: Secondary | ICD-10-CM | POA: Diagnosis not present

## 2018-02-27 DIAGNOSIS — Z471 Aftercare following joint replacement surgery: Secondary | ICD-10-CM | POA: Diagnosis not present

## 2018-02-27 DIAGNOSIS — R2681 Unsteadiness on feet: Secondary | ICD-10-CM | POA: Diagnosis not present

## 2018-02-27 DIAGNOSIS — R296 Repeated falls: Secondary | ICD-10-CM | POA: Diagnosis not present

## 2018-02-27 DIAGNOSIS — M6281 Muscle weakness (generalized): Secondary | ICD-10-CM | POA: Diagnosis not present

## 2018-02-28 DIAGNOSIS — R296 Repeated falls: Secondary | ICD-10-CM | POA: Diagnosis not present

## 2018-02-28 DIAGNOSIS — Z96651 Presence of right artificial knee joint: Secondary | ICD-10-CM | POA: Diagnosis not present

## 2018-02-28 DIAGNOSIS — R2681 Unsteadiness on feet: Secondary | ICD-10-CM | POA: Diagnosis not present

## 2018-02-28 DIAGNOSIS — M1711 Unilateral primary osteoarthritis, right knee: Secondary | ICD-10-CM | POA: Diagnosis not present

## 2018-02-28 DIAGNOSIS — Z471 Aftercare following joint replacement surgery: Secondary | ICD-10-CM | POA: Diagnosis not present

## 2018-02-28 DIAGNOSIS — M6281 Muscle weakness (generalized): Secondary | ICD-10-CM | POA: Diagnosis not present

## 2018-03-01 DIAGNOSIS — M1711 Unilateral primary osteoarthritis, right knee: Secondary | ICD-10-CM | POA: Diagnosis not present

## 2018-03-01 DIAGNOSIS — Z471 Aftercare following joint replacement surgery: Secondary | ICD-10-CM | POA: Diagnosis not present

## 2018-03-01 DIAGNOSIS — Z96651 Presence of right artificial knee joint: Secondary | ICD-10-CM | POA: Diagnosis not present

## 2018-03-01 DIAGNOSIS — M6281 Muscle weakness (generalized): Secondary | ICD-10-CM | POA: Diagnosis not present

## 2018-03-01 DIAGNOSIS — R2681 Unsteadiness on feet: Secondary | ICD-10-CM | POA: Diagnosis not present

## 2018-03-01 DIAGNOSIS — R296 Repeated falls: Secondary | ICD-10-CM | POA: Diagnosis not present

## 2018-03-02 DIAGNOSIS — Z471 Aftercare following joint replacement surgery: Secondary | ICD-10-CM | POA: Diagnosis not present

## 2018-03-02 DIAGNOSIS — Z96651 Presence of right artificial knee joint: Secondary | ICD-10-CM | POA: Diagnosis not present

## 2018-03-02 DIAGNOSIS — M6281 Muscle weakness (generalized): Secondary | ICD-10-CM | POA: Diagnosis not present

## 2018-03-02 DIAGNOSIS — R2681 Unsteadiness on feet: Secondary | ICD-10-CM | POA: Diagnosis not present

## 2018-03-02 DIAGNOSIS — M1711 Unilateral primary osteoarthritis, right knee: Secondary | ICD-10-CM | POA: Diagnosis not present

## 2018-03-02 DIAGNOSIS — R296 Repeated falls: Secondary | ICD-10-CM | POA: Diagnosis not present

## 2018-03-03 DIAGNOSIS — M1711 Unilateral primary osteoarthritis, right knee: Secondary | ICD-10-CM | POA: Diagnosis not present

## 2018-03-03 DIAGNOSIS — M6281 Muscle weakness (generalized): Secondary | ICD-10-CM | POA: Diagnosis not present

## 2018-03-03 DIAGNOSIS — R2681 Unsteadiness on feet: Secondary | ICD-10-CM | POA: Diagnosis not present

## 2018-03-03 DIAGNOSIS — Z96651 Presence of right artificial knee joint: Secondary | ICD-10-CM | POA: Diagnosis not present

## 2018-03-03 DIAGNOSIS — R296 Repeated falls: Secondary | ICD-10-CM | POA: Diagnosis not present

## 2018-03-03 DIAGNOSIS — Z471 Aftercare following joint replacement surgery: Secondary | ICD-10-CM | POA: Diagnosis not present

## 2018-03-06 DIAGNOSIS — Z471 Aftercare following joint replacement surgery: Secondary | ICD-10-CM | POA: Diagnosis not present

## 2018-03-06 DIAGNOSIS — M1711 Unilateral primary osteoarthritis, right knee: Secondary | ICD-10-CM | POA: Diagnosis not present

## 2018-03-06 DIAGNOSIS — Z96651 Presence of right artificial knee joint: Secondary | ICD-10-CM | POA: Diagnosis not present

## 2018-03-06 DIAGNOSIS — R2681 Unsteadiness on feet: Secondary | ICD-10-CM | POA: Diagnosis not present

## 2018-03-06 DIAGNOSIS — R296 Repeated falls: Secondary | ICD-10-CM | POA: Diagnosis not present

## 2018-03-06 DIAGNOSIS — M6281 Muscle weakness (generalized): Secondary | ICD-10-CM | POA: Diagnosis not present

## 2018-03-07 DIAGNOSIS — M6281 Muscle weakness (generalized): Secondary | ICD-10-CM | POA: Diagnosis not present

## 2018-03-07 DIAGNOSIS — Z96651 Presence of right artificial knee joint: Secondary | ICD-10-CM | POA: Diagnosis not present

## 2018-03-07 DIAGNOSIS — R296 Repeated falls: Secondary | ICD-10-CM | POA: Diagnosis not present

## 2018-03-07 DIAGNOSIS — Z471 Aftercare following joint replacement surgery: Secondary | ICD-10-CM | POA: Diagnosis not present

## 2018-03-07 DIAGNOSIS — R2681 Unsteadiness on feet: Secondary | ICD-10-CM | POA: Diagnosis not present

## 2018-03-07 DIAGNOSIS — M1711 Unilateral primary osteoarthritis, right knee: Secondary | ICD-10-CM | POA: Diagnosis not present

## 2018-03-08 DIAGNOSIS — R2681 Unsteadiness on feet: Secondary | ICD-10-CM | POA: Diagnosis not present

## 2018-03-08 DIAGNOSIS — M1711 Unilateral primary osteoarthritis, right knee: Secondary | ICD-10-CM | POA: Diagnosis not present

## 2018-03-08 DIAGNOSIS — Z96651 Presence of right artificial knee joint: Secondary | ICD-10-CM | POA: Diagnosis not present

## 2018-03-08 DIAGNOSIS — M6281 Muscle weakness (generalized): Secondary | ICD-10-CM | POA: Diagnosis not present

## 2018-03-08 DIAGNOSIS — Z471 Aftercare following joint replacement surgery: Secondary | ICD-10-CM | POA: Diagnosis not present

## 2018-03-08 DIAGNOSIS — R296 Repeated falls: Secondary | ICD-10-CM | POA: Diagnosis not present

## 2018-03-09 DIAGNOSIS — M1711 Unilateral primary osteoarthritis, right knee: Secondary | ICD-10-CM | POA: Diagnosis not present

## 2018-03-09 DIAGNOSIS — R2681 Unsteadiness on feet: Secondary | ICD-10-CM | POA: Diagnosis not present

## 2018-03-09 DIAGNOSIS — R296 Repeated falls: Secondary | ICD-10-CM | POA: Diagnosis not present

## 2018-03-09 DIAGNOSIS — Z471 Aftercare following joint replacement surgery: Secondary | ICD-10-CM | POA: Diagnosis not present

## 2018-03-09 DIAGNOSIS — Z96651 Presence of right artificial knee joint: Secondary | ICD-10-CM | POA: Diagnosis not present

## 2018-03-09 DIAGNOSIS — M6281 Muscle weakness (generalized): Secondary | ICD-10-CM | POA: Diagnosis not present

## 2018-03-10 DIAGNOSIS — M6281 Muscle weakness (generalized): Secondary | ICD-10-CM | POA: Diagnosis not present

## 2018-03-10 DIAGNOSIS — Z96651 Presence of right artificial knee joint: Secondary | ICD-10-CM | POA: Diagnosis not present

## 2018-03-10 DIAGNOSIS — R2681 Unsteadiness on feet: Secondary | ICD-10-CM | POA: Diagnosis not present

## 2018-03-10 DIAGNOSIS — Z471 Aftercare following joint replacement surgery: Secondary | ICD-10-CM | POA: Diagnosis not present

## 2018-03-10 DIAGNOSIS — R296 Repeated falls: Secondary | ICD-10-CM | POA: Diagnosis not present

## 2018-03-10 DIAGNOSIS — M1711 Unilateral primary osteoarthritis, right knee: Secondary | ICD-10-CM | POA: Diagnosis not present

## 2018-03-12 DIAGNOSIS — R296 Repeated falls: Secondary | ICD-10-CM | POA: Diagnosis not present

## 2018-03-12 DIAGNOSIS — Z96651 Presence of right artificial knee joint: Secondary | ICD-10-CM | POA: Diagnosis not present

## 2018-03-12 DIAGNOSIS — Z471 Aftercare following joint replacement surgery: Secondary | ICD-10-CM | POA: Diagnosis not present

## 2018-03-12 DIAGNOSIS — M6281 Muscle weakness (generalized): Secondary | ICD-10-CM | POA: Diagnosis not present

## 2018-03-12 DIAGNOSIS — M1711 Unilateral primary osteoarthritis, right knee: Secondary | ICD-10-CM | POA: Diagnosis not present

## 2018-03-12 DIAGNOSIS — R2681 Unsteadiness on feet: Secondary | ICD-10-CM | POA: Diagnosis not present

## 2018-03-13 LAB — FUNGAL ORGANISM REFLEX

## 2018-03-13 LAB — FUNGUS CULTURE WITH STAIN

## 2018-03-13 LAB — FUNGUS CULTURE RESULT

## 2018-03-15 LAB — FUNGUS CULTURE WITH STAIN

## 2018-03-15 LAB — FUNGUS CULTURE RESULT

## 2018-03-15 LAB — FUNGAL ORGANISM REFLEX

## 2018-03-16 DIAGNOSIS — M1711 Unilateral primary osteoarthritis, right knee: Secondary | ICD-10-CM | POA: Diagnosis not present

## 2018-03-16 DIAGNOSIS — Z96651 Presence of right artificial knee joint: Secondary | ICD-10-CM | POA: Diagnosis not present

## 2018-03-16 DIAGNOSIS — Z471 Aftercare following joint replacement surgery: Secondary | ICD-10-CM | POA: Diagnosis not present

## 2018-03-16 DIAGNOSIS — M6281 Muscle weakness (generalized): Secondary | ICD-10-CM | POA: Diagnosis not present

## 2018-03-16 DIAGNOSIS — R2681 Unsteadiness on feet: Secondary | ICD-10-CM | POA: Diagnosis not present

## 2018-03-16 DIAGNOSIS — R296 Repeated falls: Secondary | ICD-10-CM | POA: Diagnosis not present

## 2018-03-17 DIAGNOSIS — Z471 Aftercare following joint replacement surgery: Secondary | ICD-10-CM | POA: Diagnosis not present

## 2018-03-17 DIAGNOSIS — M6281 Muscle weakness (generalized): Secondary | ICD-10-CM | POA: Diagnosis not present

## 2018-03-17 DIAGNOSIS — Z96651 Presence of right artificial knee joint: Secondary | ICD-10-CM | POA: Diagnosis not present

## 2018-03-17 DIAGNOSIS — M1711 Unilateral primary osteoarthritis, right knee: Secondary | ICD-10-CM | POA: Diagnosis not present

## 2018-03-17 DIAGNOSIS — R296 Repeated falls: Secondary | ICD-10-CM | POA: Diagnosis not present

## 2018-03-17 DIAGNOSIS — R2681 Unsteadiness on feet: Secondary | ICD-10-CM | POA: Diagnosis not present

## 2018-03-18 DIAGNOSIS — R296 Repeated falls: Secondary | ICD-10-CM | POA: Diagnosis not present

## 2018-03-18 DIAGNOSIS — Z471 Aftercare following joint replacement surgery: Secondary | ICD-10-CM | POA: Diagnosis not present

## 2018-03-18 DIAGNOSIS — Z96651 Presence of right artificial knee joint: Secondary | ICD-10-CM | POA: Diagnosis not present

## 2018-03-18 DIAGNOSIS — M6281 Muscle weakness (generalized): Secondary | ICD-10-CM | POA: Diagnosis not present

## 2018-03-18 DIAGNOSIS — M1711 Unilateral primary osteoarthritis, right knee: Secondary | ICD-10-CM | POA: Diagnosis not present

## 2018-03-18 DIAGNOSIS — R2681 Unsteadiness on feet: Secondary | ICD-10-CM | POA: Diagnosis not present

## 2018-03-21 DIAGNOSIS — M6281 Muscle weakness (generalized): Secondary | ICD-10-CM | POA: Diagnosis not present

## 2018-03-21 DIAGNOSIS — K5900 Constipation, unspecified: Secondary | ICD-10-CM | POA: Diagnosis not present

## 2018-03-21 DIAGNOSIS — R296 Repeated falls: Secondary | ICD-10-CM | POA: Diagnosis not present

## 2018-03-21 DIAGNOSIS — M1711 Unilateral primary osteoarthritis, right knee: Secondary | ICD-10-CM | POA: Diagnosis not present

## 2018-03-21 DIAGNOSIS — I10 Essential (primary) hypertension: Secondary | ICD-10-CM | POA: Diagnosis not present

## 2018-03-21 DIAGNOSIS — Z471 Aftercare following joint replacement surgery: Secondary | ICD-10-CM | POA: Diagnosis not present

## 2018-03-21 DIAGNOSIS — R2681 Unsteadiness on feet: Secondary | ICD-10-CM | POA: Diagnosis not present

## 2018-03-21 DIAGNOSIS — M170 Bilateral primary osteoarthritis of knee: Secondary | ICD-10-CM | POA: Diagnosis not present

## 2018-03-21 DIAGNOSIS — Z96651 Presence of right artificial knee joint: Secondary | ICD-10-CM | POA: Diagnosis not present

## 2018-03-21 DIAGNOSIS — I6529 Occlusion and stenosis of unspecified carotid artery: Secondary | ICD-10-CM | POA: Diagnosis not present

## 2018-03-21 DIAGNOSIS — Z79899 Other long term (current) drug therapy: Secondary | ICD-10-CM | POA: Diagnosis not present

## 2018-03-23 DIAGNOSIS — R2681 Unsteadiness on feet: Secondary | ICD-10-CM | POA: Diagnosis not present

## 2018-03-23 DIAGNOSIS — Z96651 Presence of right artificial knee joint: Secondary | ICD-10-CM | POA: Diagnosis not present

## 2018-03-23 DIAGNOSIS — Z471 Aftercare following joint replacement surgery: Secondary | ICD-10-CM | POA: Diagnosis not present

## 2018-03-23 DIAGNOSIS — R296 Repeated falls: Secondary | ICD-10-CM | POA: Diagnosis not present

## 2018-03-23 DIAGNOSIS — M6281 Muscle weakness (generalized): Secondary | ICD-10-CM | POA: Diagnosis not present

## 2018-03-23 DIAGNOSIS — Z79899 Other long term (current) drug therapy: Secondary | ICD-10-CM | POA: Diagnosis not present

## 2018-03-23 DIAGNOSIS — M1711 Unilateral primary osteoarthritis, right knee: Secondary | ICD-10-CM | POA: Diagnosis not present

## 2018-03-24 DIAGNOSIS — M6281 Muscle weakness (generalized): Secondary | ICD-10-CM | POA: Diagnosis not present

## 2018-03-24 DIAGNOSIS — M1711 Unilateral primary osteoarthritis, right knee: Secondary | ICD-10-CM | POA: Diagnosis not present

## 2018-03-24 DIAGNOSIS — R296 Repeated falls: Secondary | ICD-10-CM | POA: Diagnosis not present

## 2018-03-24 DIAGNOSIS — R2681 Unsteadiness on feet: Secondary | ICD-10-CM | POA: Diagnosis not present

## 2018-03-24 DIAGNOSIS — Z96651 Presence of right artificial knee joint: Secondary | ICD-10-CM | POA: Diagnosis not present

## 2018-03-24 DIAGNOSIS — Z471 Aftercare following joint replacement surgery: Secondary | ICD-10-CM | POA: Diagnosis not present

## 2018-03-25 DIAGNOSIS — R2681 Unsteadiness on feet: Secondary | ICD-10-CM | POA: Diagnosis not present

## 2018-03-25 DIAGNOSIS — R296 Repeated falls: Secondary | ICD-10-CM | POA: Diagnosis not present

## 2018-03-25 DIAGNOSIS — M6281 Muscle weakness (generalized): Secondary | ICD-10-CM | POA: Diagnosis not present

## 2018-03-25 DIAGNOSIS — Z471 Aftercare following joint replacement surgery: Secondary | ICD-10-CM | POA: Diagnosis not present

## 2018-03-25 DIAGNOSIS — Z96651 Presence of right artificial knee joint: Secondary | ICD-10-CM | POA: Diagnosis not present

## 2018-03-25 DIAGNOSIS — M1711 Unilateral primary osteoarthritis, right knee: Secondary | ICD-10-CM | POA: Diagnosis not present

## 2018-03-28 DIAGNOSIS — M6281 Muscle weakness (generalized): Secondary | ICD-10-CM | POA: Diagnosis not present

## 2018-03-28 DIAGNOSIS — R2681 Unsteadiness on feet: Secondary | ICD-10-CM | POA: Diagnosis not present

## 2018-03-28 DIAGNOSIS — M1711 Unilateral primary osteoarthritis, right knee: Secondary | ICD-10-CM | POA: Diagnosis not present

## 2018-03-28 DIAGNOSIS — Z471 Aftercare following joint replacement surgery: Secondary | ICD-10-CM | POA: Diagnosis not present

## 2018-03-28 DIAGNOSIS — Z96651 Presence of right artificial knee joint: Secondary | ICD-10-CM | POA: Diagnosis not present

## 2018-03-28 DIAGNOSIS — R296 Repeated falls: Secondary | ICD-10-CM | POA: Diagnosis not present

## 2018-04-01 DIAGNOSIS — M6281 Muscle weakness (generalized): Secondary | ICD-10-CM | POA: Diagnosis not present

## 2018-04-01 DIAGNOSIS — M1711 Unilateral primary osteoarthritis, right knee: Secondary | ICD-10-CM | POA: Diagnosis not present

## 2018-04-01 DIAGNOSIS — Z96651 Presence of right artificial knee joint: Secondary | ICD-10-CM | POA: Diagnosis not present

## 2018-04-01 DIAGNOSIS — R2681 Unsteadiness on feet: Secondary | ICD-10-CM | POA: Diagnosis not present

## 2018-04-01 DIAGNOSIS — R296 Repeated falls: Secondary | ICD-10-CM | POA: Diagnosis not present

## 2018-04-01 DIAGNOSIS — Z471 Aftercare following joint replacement surgery: Secondary | ICD-10-CM | POA: Diagnosis not present

## 2018-04-04 DIAGNOSIS — B379 Candidiasis, unspecified: Secondary | ICD-10-CM | POA: Diagnosis not present

## 2018-04-04 DIAGNOSIS — R05 Cough: Secondary | ICD-10-CM | POA: Diagnosis not present

## 2018-04-04 DIAGNOSIS — R0902 Hypoxemia: Secondary | ICD-10-CM | POA: Diagnosis not present

## 2018-04-07 DIAGNOSIS — M1711 Unilateral primary osteoarthritis, right knee: Secondary | ICD-10-CM | POA: Diagnosis not present

## 2018-04-07 DIAGNOSIS — Z471 Aftercare following joint replacement surgery: Secondary | ICD-10-CM | POA: Diagnosis not present

## 2018-04-07 DIAGNOSIS — R2681 Unsteadiness on feet: Secondary | ICD-10-CM | POA: Diagnosis not present

## 2018-04-07 DIAGNOSIS — Z96651 Presence of right artificial knee joint: Secondary | ICD-10-CM | POA: Diagnosis not present

## 2018-04-07 DIAGNOSIS — M6281 Muscle weakness (generalized): Secondary | ICD-10-CM | POA: Diagnosis not present

## 2018-04-07 DIAGNOSIS — R296 Repeated falls: Secondary | ICD-10-CM | POA: Diagnosis not present

## 2018-04-08 DIAGNOSIS — M1711 Unilateral primary osteoarthritis, right knee: Secondary | ICD-10-CM | POA: Diagnosis not present

## 2018-04-08 DIAGNOSIS — M6281 Muscle weakness (generalized): Secondary | ICD-10-CM | POA: Diagnosis not present

## 2018-04-08 DIAGNOSIS — R2681 Unsteadiness on feet: Secondary | ICD-10-CM | POA: Diagnosis not present

## 2018-04-08 DIAGNOSIS — Z471 Aftercare following joint replacement surgery: Secondary | ICD-10-CM | POA: Diagnosis not present

## 2018-04-08 DIAGNOSIS — Z96651 Presence of right artificial knee joint: Secondary | ICD-10-CM | POA: Diagnosis not present

## 2018-04-08 DIAGNOSIS — R296 Repeated falls: Secondary | ICD-10-CM | POA: Diagnosis not present

## 2018-04-13 DIAGNOSIS — Z471 Aftercare following joint replacement surgery: Secondary | ICD-10-CM | POA: Diagnosis not present

## 2018-04-13 DIAGNOSIS — M1711 Unilateral primary osteoarthritis, right knee: Secondary | ICD-10-CM | POA: Diagnosis not present

## 2018-04-13 DIAGNOSIS — Z96651 Presence of right artificial knee joint: Secondary | ICD-10-CM | POA: Diagnosis not present

## 2018-04-13 DIAGNOSIS — M6281 Muscle weakness (generalized): Secondary | ICD-10-CM | POA: Diagnosis not present

## 2018-04-13 DIAGNOSIS — R296 Repeated falls: Secondary | ICD-10-CM | POA: Diagnosis not present

## 2018-04-13 DIAGNOSIS — R2681 Unsteadiness on feet: Secondary | ICD-10-CM | POA: Diagnosis not present

## 2018-04-14 DIAGNOSIS — M6281 Muscle weakness (generalized): Secondary | ICD-10-CM | POA: Diagnosis not present

## 2018-04-14 DIAGNOSIS — R296 Repeated falls: Secondary | ICD-10-CM | POA: Diagnosis not present

## 2018-04-14 DIAGNOSIS — M1711 Unilateral primary osteoarthritis, right knee: Secondary | ICD-10-CM | POA: Diagnosis not present

## 2018-04-14 DIAGNOSIS — Z96651 Presence of right artificial knee joint: Secondary | ICD-10-CM | POA: Diagnosis not present

## 2018-04-14 DIAGNOSIS — Z471 Aftercare following joint replacement surgery: Secondary | ICD-10-CM | POA: Diagnosis not present

## 2018-04-14 DIAGNOSIS — R2681 Unsteadiness on feet: Secondary | ICD-10-CM | POA: Diagnosis not present

## 2018-04-18 DIAGNOSIS — R2681 Unsteadiness on feet: Secondary | ICD-10-CM | POA: Diagnosis not present

## 2018-04-18 DIAGNOSIS — M6281 Muscle weakness (generalized): Secondary | ICD-10-CM | POA: Diagnosis not present

## 2018-04-18 DIAGNOSIS — R296 Repeated falls: Secondary | ICD-10-CM | POA: Diagnosis not present

## 2018-04-18 DIAGNOSIS — I10 Essential (primary) hypertension: Secondary | ICD-10-CM | POA: Diagnosis not present

## 2018-04-18 DIAGNOSIS — Z471 Aftercare following joint replacement surgery: Secondary | ICD-10-CM | POA: Diagnosis not present

## 2018-04-18 DIAGNOSIS — R0602 Shortness of breath: Secondary | ICD-10-CM | POA: Diagnosis not present

## 2018-04-18 DIAGNOSIS — R062 Wheezing: Secondary | ICD-10-CM | POA: Diagnosis not present

## 2018-04-18 DIAGNOSIS — I2510 Atherosclerotic heart disease of native coronary artery without angina pectoris: Secondary | ICD-10-CM | POA: Diagnosis not present

## 2018-04-20 DIAGNOSIS — R2681 Unsteadiness on feet: Secondary | ICD-10-CM | POA: Diagnosis not present

## 2018-04-20 DIAGNOSIS — R296 Repeated falls: Secondary | ICD-10-CM | POA: Diagnosis not present

## 2018-04-20 DIAGNOSIS — M6281 Muscle weakness (generalized): Secondary | ICD-10-CM | POA: Diagnosis not present

## 2018-04-20 DIAGNOSIS — Z471 Aftercare following joint replacement surgery: Secondary | ICD-10-CM | POA: Diagnosis not present

## 2018-04-24 DIAGNOSIS — Z471 Aftercare following joint replacement surgery: Secondary | ICD-10-CM | POA: Diagnosis not present

## 2018-04-24 DIAGNOSIS — R296 Repeated falls: Secondary | ICD-10-CM | POA: Diagnosis not present

## 2018-04-24 DIAGNOSIS — R2681 Unsteadiness on feet: Secondary | ICD-10-CM | POA: Diagnosis not present

## 2018-04-24 DIAGNOSIS — M6281 Muscle weakness (generalized): Secondary | ICD-10-CM | POA: Diagnosis not present

## 2018-04-25 DIAGNOSIS — R0602 Shortness of breath: Secondary | ICD-10-CM | POA: Diagnosis not present

## 2018-04-25 DIAGNOSIS — Z79899 Other long term (current) drug therapy: Secondary | ICD-10-CM | POA: Diagnosis not present

## 2018-04-26 DIAGNOSIS — R2681 Unsteadiness on feet: Secondary | ICD-10-CM | POA: Diagnosis not present

## 2018-04-26 DIAGNOSIS — Z471 Aftercare following joint replacement surgery: Secondary | ICD-10-CM | POA: Diagnosis not present

## 2018-04-26 DIAGNOSIS — M6281 Muscle weakness (generalized): Secondary | ICD-10-CM | POA: Diagnosis not present

## 2018-04-26 DIAGNOSIS — R296 Repeated falls: Secondary | ICD-10-CM | POA: Diagnosis not present

## 2018-04-27 DIAGNOSIS — R2681 Unsteadiness on feet: Secondary | ICD-10-CM | POA: Diagnosis not present

## 2018-04-27 DIAGNOSIS — M6281 Muscle weakness (generalized): Secondary | ICD-10-CM | POA: Diagnosis not present

## 2018-04-27 DIAGNOSIS — Z471 Aftercare following joint replacement surgery: Secondary | ICD-10-CM | POA: Diagnosis not present

## 2018-04-27 DIAGNOSIS — R296 Repeated falls: Secondary | ICD-10-CM | POA: Diagnosis not present

## 2018-05-01 DIAGNOSIS — R296 Repeated falls: Secondary | ICD-10-CM | POA: Diagnosis not present

## 2018-05-01 DIAGNOSIS — Z471 Aftercare following joint replacement surgery: Secondary | ICD-10-CM | POA: Diagnosis not present

## 2018-05-01 DIAGNOSIS — M6281 Muscle weakness (generalized): Secondary | ICD-10-CM | POA: Diagnosis not present

## 2018-05-01 DIAGNOSIS — R2681 Unsteadiness on feet: Secondary | ICD-10-CM | POA: Diagnosis not present

## 2018-05-02 DIAGNOSIS — R296 Repeated falls: Secondary | ICD-10-CM | POA: Diagnosis not present

## 2018-05-02 DIAGNOSIS — R2681 Unsteadiness on feet: Secondary | ICD-10-CM | POA: Diagnosis not present

## 2018-05-02 DIAGNOSIS — M6281 Muscle weakness (generalized): Secondary | ICD-10-CM | POA: Diagnosis not present

## 2018-05-02 DIAGNOSIS — Z471 Aftercare following joint replacement surgery: Secondary | ICD-10-CM | POA: Diagnosis not present

## 2018-05-23 DIAGNOSIS — M179 Osteoarthritis of knee, unspecified: Secondary | ICD-10-CM | POA: Diagnosis not present

## 2018-05-23 DIAGNOSIS — J449 Chronic obstructive pulmonary disease, unspecified: Secondary | ICD-10-CM | POA: Diagnosis not present

## 2018-05-23 DIAGNOSIS — E785 Hyperlipidemia, unspecified: Secondary | ICD-10-CM | POA: Diagnosis not present

## 2018-05-23 DIAGNOSIS — Z79899 Other long term (current) drug therapy: Secondary | ICD-10-CM | POA: Diagnosis not present

## 2018-05-23 DIAGNOSIS — I10 Essential (primary) hypertension: Secondary | ICD-10-CM | POA: Diagnosis not present

## 2018-06-13 DIAGNOSIS — M170 Bilateral primary osteoarthritis of knee: Secondary | ICD-10-CM | POA: Diagnosis not present

## 2018-06-13 DIAGNOSIS — J449 Chronic obstructive pulmonary disease, unspecified: Secondary | ICD-10-CM | POA: Diagnosis not present

## 2018-06-13 DIAGNOSIS — Z79899 Other long term (current) drug therapy: Secondary | ICD-10-CM | POA: Diagnosis not present

## 2018-06-13 DIAGNOSIS — R2681 Unsteadiness on feet: Secondary | ICD-10-CM | POA: Diagnosis not present

## 2018-06-20 DIAGNOSIS — J449 Chronic obstructive pulmonary disease, unspecified: Secondary | ICD-10-CM | POA: Diagnosis not present

## 2018-06-20 DIAGNOSIS — L309 Dermatitis, unspecified: Secondary | ICD-10-CM | POA: Diagnosis not present

## 2018-06-20 DIAGNOSIS — E785 Hyperlipidemia, unspecified: Secondary | ICD-10-CM | POA: Diagnosis not present

## 2018-06-20 DIAGNOSIS — I10 Essential (primary) hypertension: Secondary | ICD-10-CM | POA: Diagnosis not present

## 2018-06-22 LAB — GLUCOSE, BODY FLUID OTHER: GLUCOSE, BODY FLUID OTHER: 101 mg/dL

## 2018-06-25 DIAGNOSIS — M6281 Muscle weakness (generalized): Secondary | ICD-10-CM | POA: Diagnosis not present

## 2018-06-25 DIAGNOSIS — M179 Osteoarthritis of knee, unspecified: Secondary | ICD-10-CM | POA: Diagnosis not present

## 2018-06-25 DIAGNOSIS — R278 Other lack of coordination: Secondary | ICD-10-CM | POA: Diagnosis not present

## 2018-06-26 DIAGNOSIS — R278 Other lack of coordination: Secondary | ICD-10-CM | POA: Diagnosis not present

## 2018-06-26 DIAGNOSIS — M6281 Muscle weakness (generalized): Secondary | ICD-10-CM | POA: Diagnosis not present

## 2018-06-26 DIAGNOSIS — M179 Osteoarthritis of knee, unspecified: Secondary | ICD-10-CM | POA: Diagnosis not present

## 2018-06-28 DIAGNOSIS — M179 Osteoarthritis of knee, unspecified: Secondary | ICD-10-CM | POA: Diagnosis not present

## 2018-06-28 DIAGNOSIS — R278 Other lack of coordination: Secondary | ICD-10-CM | POA: Diagnosis not present

## 2018-06-28 DIAGNOSIS — M6281 Muscle weakness (generalized): Secondary | ICD-10-CM | POA: Diagnosis not present

## 2018-06-29 DIAGNOSIS — R278 Other lack of coordination: Secondary | ICD-10-CM | POA: Diagnosis not present

## 2018-06-29 DIAGNOSIS — M6281 Muscle weakness (generalized): Secondary | ICD-10-CM | POA: Diagnosis not present

## 2018-06-29 DIAGNOSIS — M179 Osteoarthritis of knee, unspecified: Secondary | ICD-10-CM | POA: Diagnosis not present

## 2018-06-30 DIAGNOSIS — M179 Osteoarthritis of knee, unspecified: Secondary | ICD-10-CM | POA: Diagnosis not present

## 2018-06-30 DIAGNOSIS — R278 Other lack of coordination: Secondary | ICD-10-CM | POA: Diagnosis not present

## 2018-06-30 DIAGNOSIS — M6281 Muscle weakness (generalized): Secondary | ICD-10-CM | POA: Diagnosis not present

## 2018-07-04 DIAGNOSIS — M6281 Muscle weakness (generalized): Secondary | ICD-10-CM | POA: Diagnosis not present

## 2018-07-04 DIAGNOSIS — M179 Osteoarthritis of knee, unspecified: Secondary | ICD-10-CM | POA: Diagnosis not present

## 2018-07-04 DIAGNOSIS — R278 Other lack of coordination: Secondary | ICD-10-CM | POA: Diagnosis not present

## 2018-07-05 DIAGNOSIS — R278 Other lack of coordination: Secondary | ICD-10-CM | POA: Diagnosis not present

## 2018-07-05 DIAGNOSIS — M179 Osteoarthritis of knee, unspecified: Secondary | ICD-10-CM | POA: Diagnosis not present

## 2018-07-05 DIAGNOSIS — M6281 Muscle weakness (generalized): Secondary | ICD-10-CM | POA: Diagnosis not present

## 2018-07-06 DIAGNOSIS — R278 Other lack of coordination: Secondary | ICD-10-CM | POA: Diagnosis not present

## 2018-07-06 DIAGNOSIS — M6281 Muscle weakness (generalized): Secondary | ICD-10-CM | POA: Diagnosis not present

## 2018-07-06 DIAGNOSIS — M179 Osteoarthritis of knee, unspecified: Secondary | ICD-10-CM | POA: Diagnosis not present

## 2018-07-07 DIAGNOSIS — M179 Osteoarthritis of knee, unspecified: Secondary | ICD-10-CM | POA: Diagnosis not present

## 2018-07-07 DIAGNOSIS — M6281 Muscle weakness (generalized): Secondary | ICD-10-CM | POA: Diagnosis not present

## 2018-07-07 DIAGNOSIS — R278 Other lack of coordination: Secondary | ICD-10-CM | POA: Diagnosis not present

## 2018-07-10 DIAGNOSIS — M6281 Muscle weakness (generalized): Secondary | ICD-10-CM | POA: Diagnosis not present

## 2018-07-10 DIAGNOSIS — M179 Osteoarthritis of knee, unspecified: Secondary | ICD-10-CM | POA: Diagnosis not present

## 2018-07-10 DIAGNOSIS — R278 Other lack of coordination: Secondary | ICD-10-CM | POA: Diagnosis not present

## 2018-07-11 DIAGNOSIS — R278 Other lack of coordination: Secondary | ICD-10-CM | POA: Diagnosis not present

## 2018-07-11 DIAGNOSIS — M6281 Muscle weakness (generalized): Secondary | ICD-10-CM | POA: Diagnosis not present

## 2018-07-11 DIAGNOSIS — M179 Osteoarthritis of knee, unspecified: Secondary | ICD-10-CM | POA: Diagnosis not present

## 2018-07-14 DIAGNOSIS — R278 Other lack of coordination: Secondary | ICD-10-CM | POA: Diagnosis not present

## 2018-07-14 DIAGNOSIS — M6281 Muscle weakness (generalized): Secondary | ICD-10-CM | POA: Diagnosis not present

## 2018-07-14 DIAGNOSIS — M179 Osteoarthritis of knee, unspecified: Secondary | ICD-10-CM | POA: Diagnosis not present

## 2018-07-17 DIAGNOSIS — R278 Other lack of coordination: Secondary | ICD-10-CM | POA: Diagnosis not present

## 2018-07-17 DIAGNOSIS — M179 Osteoarthritis of knee, unspecified: Secondary | ICD-10-CM | POA: Diagnosis not present

## 2018-07-17 DIAGNOSIS — M6281 Muscle weakness (generalized): Secondary | ICD-10-CM | POA: Diagnosis not present

## 2018-07-18 DIAGNOSIS — M6281 Muscle weakness (generalized): Secondary | ICD-10-CM | POA: Diagnosis not present

## 2018-07-18 DIAGNOSIS — M179 Osteoarthritis of knee, unspecified: Secondary | ICD-10-CM | POA: Diagnosis not present

## 2018-07-18 DIAGNOSIS — R278 Other lack of coordination: Secondary | ICD-10-CM | POA: Diagnosis not present

## 2018-07-20 DIAGNOSIS — M179 Osteoarthritis of knee, unspecified: Secondary | ICD-10-CM | POA: Diagnosis not present

## 2018-07-20 DIAGNOSIS — M6281 Muscle weakness (generalized): Secondary | ICD-10-CM | POA: Diagnosis not present

## 2018-07-20 DIAGNOSIS — R278 Other lack of coordination: Secondary | ICD-10-CM | POA: Diagnosis not present

## 2018-07-21 DIAGNOSIS — M6281 Muscle weakness (generalized): Secondary | ICD-10-CM | POA: Diagnosis not present

## 2018-07-21 DIAGNOSIS — M179 Osteoarthritis of knee, unspecified: Secondary | ICD-10-CM | POA: Diagnosis not present

## 2018-07-21 DIAGNOSIS — R278 Other lack of coordination: Secondary | ICD-10-CM | POA: Diagnosis not present

## 2018-07-24 DIAGNOSIS — R278 Other lack of coordination: Secondary | ICD-10-CM | POA: Diagnosis not present

## 2018-07-24 DIAGNOSIS — M179 Osteoarthritis of knee, unspecified: Secondary | ICD-10-CM | POA: Diagnosis not present

## 2018-07-24 DIAGNOSIS — M6281 Muscle weakness (generalized): Secondary | ICD-10-CM | POA: Diagnosis not present

## 2018-07-25 DIAGNOSIS — M179 Osteoarthritis of knee, unspecified: Secondary | ICD-10-CM | POA: Diagnosis not present

## 2018-07-25 DIAGNOSIS — M6281 Muscle weakness (generalized): Secondary | ICD-10-CM | POA: Diagnosis not present

## 2018-07-25 DIAGNOSIS — R278 Other lack of coordination: Secondary | ICD-10-CM | POA: Diagnosis not present

## 2018-07-26 DIAGNOSIS — R278 Other lack of coordination: Secondary | ICD-10-CM | POA: Diagnosis not present

## 2018-07-26 DIAGNOSIS — M179 Osteoarthritis of knee, unspecified: Secondary | ICD-10-CM | POA: Diagnosis not present

## 2018-07-26 DIAGNOSIS — M6281 Muscle weakness (generalized): Secondary | ICD-10-CM | POA: Diagnosis not present

## 2018-07-27 DIAGNOSIS — R278 Other lack of coordination: Secondary | ICD-10-CM | POA: Diagnosis not present

## 2018-07-27 DIAGNOSIS — M6281 Muscle weakness (generalized): Secondary | ICD-10-CM | POA: Diagnosis not present

## 2018-07-27 DIAGNOSIS — M179 Osteoarthritis of knee, unspecified: Secondary | ICD-10-CM | POA: Diagnosis not present

## 2018-07-29 DIAGNOSIS — R278 Other lack of coordination: Secondary | ICD-10-CM | POA: Diagnosis not present

## 2018-07-29 DIAGNOSIS — M179 Osteoarthritis of knee, unspecified: Secondary | ICD-10-CM | POA: Diagnosis not present

## 2018-07-29 DIAGNOSIS — M6281 Muscle weakness (generalized): Secondary | ICD-10-CM | POA: Diagnosis not present

## 2018-08-01 DIAGNOSIS — M179 Osteoarthritis of knee, unspecified: Secondary | ICD-10-CM | POA: Diagnosis not present

## 2018-08-01 DIAGNOSIS — R278 Other lack of coordination: Secondary | ICD-10-CM | POA: Diagnosis not present

## 2018-08-01 DIAGNOSIS — M6281 Muscle weakness (generalized): Secondary | ICD-10-CM | POA: Diagnosis not present

## 2018-08-02 DIAGNOSIS — J449 Chronic obstructive pulmonary disease, unspecified: Secondary | ICD-10-CM | POA: Diagnosis not present

## 2018-08-02 DIAGNOSIS — I10 Essential (primary) hypertension: Secondary | ICD-10-CM | POA: Diagnosis not present

## 2018-08-02 DIAGNOSIS — R0602 Shortness of breath: Secondary | ICD-10-CM | POA: Diagnosis not present

## 2018-08-02 DIAGNOSIS — M6281 Muscle weakness (generalized): Secondary | ICD-10-CM | POA: Diagnosis not present

## 2018-08-02 DIAGNOSIS — M179 Osteoarthritis of knee, unspecified: Secondary | ICD-10-CM | POA: Diagnosis not present

## 2018-08-02 DIAGNOSIS — R112 Nausea with vomiting, unspecified: Secondary | ICD-10-CM | POA: Diagnosis not present

## 2018-08-02 DIAGNOSIS — R278 Other lack of coordination: Secondary | ICD-10-CM | POA: Diagnosis not present

## 2018-08-02 DIAGNOSIS — M170 Bilateral primary osteoarthritis of knee: Secondary | ICD-10-CM | POA: Diagnosis not present

## 2018-08-04 DIAGNOSIS — R278 Other lack of coordination: Secondary | ICD-10-CM | POA: Diagnosis not present

## 2018-08-04 DIAGNOSIS — M6281 Muscle weakness (generalized): Secondary | ICD-10-CM | POA: Diagnosis not present

## 2018-08-04 DIAGNOSIS — M179 Osteoarthritis of knee, unspecified: Secondary | ICD-10-CM | POA: Diagnosis not present

## 2018-08-06 ENCOUNTER — Encounter (HOSPITAL_COMMUNITY): Payer: Self-pay

## 2018-08-06 ENCOUNTER — Emergency Department (HOSPITAL_COMMUNITY): Payer: Medicare Other

## 2018-08-06 ENCOUNTER — Other Ambulatory Visit: Payer: Self-pay

## 2018-08-06 ENCOUNTER — Inpatient Hospital Stay (HOSPITAL_COMMUNITY)
Admission: EM | Admit: 2018-08-06 | Discharge: 2018-08-10 | DRG: 193 | Disposition: A | Discharge status: Skilled Nursing Facility | Payer: Medicare Other | Source: Skilled Nursing Facility | Attending: Family Medicine | Admitting: Family Medicine

## 2018-08-06 DIAGNOSIS — Z9841 Cataract extraction status, right eye: Secondary | ICD-10-CM

## 2018-08-06 DIAGNOSIS — A09 Infectious gastroenteritis and colitis, unspecified: Secondary | ICD-10-CM | POA: Diagnosis not present

## 2018-08-06 DIAGNOSIS — S80811A Abrasion, right lower leg, initial encounter: Secondary | ICD-10-CM | POA: Diagnosis present

## 2018-08-06 DIAGNOSIS — R630 Anorexia: Secondary | ICD-10-CM | POA: Diagnosis present

## 2018-08-06 DIAGNOSIS — J40 Bronchitis, not specified as acute or chronic: Secondary | ICD-10-CM | POA: Diagnosis present

## 2018-08-06 DIAGNOSIS — D381 Neoplasm of uncertain behavior of trachea, bronchus and lung: Secondary | ICD-10-CM | POA: Diagnosis present

## 2018-08-06 DIAGNOSIS — M2550 Pain in unspecified joint: Secondary | ICD-10-CM | POA: Diagnosis not present

## 2018-08-06 DIAGNOSIS — S299XXA Unspecified injury of thorax, initial encounter: Secondary | ICD-10-CM | POA: Diagnosis not present

## 2018-08-06 DIAGNOSIS — R296 Repeated falls: Secondary | ICD-10-CM | POA: Diagnosis present

## 2018-08-06 DIAGNOSIS — Z87891 Personal history of nicotine dependence: Secondary | ICD-10-CM

## 2018-08-06 DIAGNOSIS — R197 Diarrhea, unspecified: Secondary | ICD-10-CM

## 2018-08-06 DIAGNOSIS — I2510 Atherosclerotic heart disease of native coronary artery without angina pectoris: Secondary | ICD-10-CM | POA: Diagnosis present

## 2018-08-06 DIAGNOSIS — S80812A Abrasion, left lower leg, initial encounter: Secondary | ICD-10-CM | POA: Diagnosis present

## 2018-08-06 DIAGNOSIS — K6389 Other specified diseases of intestine: Secondary | ICD-10-CM | POA: Diagnosis present

## 2018-08-06 DIAGNOSIS — J9600 Acute respiratory failure, unspecified whether with hypoxia or hypercapnia: Secondary | ICD-10-CM | POA: Diagnosis not present

## 2018-08-06 DIAGNOSIS — N281 Cyst of kidney, acquired: Secondary | ICD-10-CM | POA: Diagnosis not present

## 2018-08-06 DIAGNOSIS — I10 Essential (primary) hypertension: Secondary | ICD-10-CM | POA: Diagnosis present

## 2018-08-06 DIAGNOSIS — Z66 Do not resuscitate: Secondary | ICD-10-CM | POA: Diagnosis present

## 2018-08-06 DIAGNOSIS — Z7401 Bed confinement status: Secondary | ICD-10-CM | POA: Diagnosis not present

## 2018-08-06 DIAGNOSIS — R531 Weakness: Secondary | ICD-10-CM | POA: Diagnosis not present

## 2018-08-06 DIAGNOSIS — R918 Other nonspecific abnormal finding of lung field: Secondary | ICD-10-CM | POA: Diagnosis not present

## 2018-08-06 DIAGNOSIS — R0902 Hypoxemia: Secondary | ICD-10-CM

## 2018-08-06 DIAGNOSIS — R21 Rash and other nonspecific skin eruption: Secondary | ICD-10-CM | POA: Diagnosis present

## 2018-08-06 DIAGNOSIS — R1111 Vomiting without nausea: Secondary | ICD-10-CM | POA: Diagnosis not present

## 2018-08-06 DIAGNOSIS — R410 Disorientation, unspecified: Secondary | ICD-10-CM | POA: Diagnosis not present

## 2018-08-06 DIAGNOSIS — F0390 Unspecified dementia without behavioral disturbance: Secondary | ICD-10-CM | POA: Diagnosis present

## 2018-08-06 DIAGNOSIS — R911 Solitary pulmonary nodule: Secondary | ICD-10-CM | POA: Diagnosis present

## 2018-08-06 DIAGNOSIS — Z9842 Cataract extraction status, left eye: Secondary | ICD-10-CM

## 2018-08-06 DIAGNOSIS — W19XXXA Unspecified fall, initial encounter: Secondary | ICD-10-CM | POA: Diagnosis present

## 2018-08-06 DIAGNOSIS — E785 Hyperlipidemia, unspecified: Secondary | ICD-10-CM | POA: Diagnosis present

## 2018-08-06 DIAGNOSIS — Z7982 Long term (current) use of aspirin: Secondary | ICD-10-CM | POA: Diagnosis not present

## 2018-08-06 DIAGNOSIS — J189 Pneumonia, unspecified organism: Secondary | ICD-10-CM | POA: Diagnosis present

## 2018-08-06 DIAGNOSIS — Z79899 Other long term (current) drug therapy: Secondary | ICD-10-CM

## 2018-08-06 DIAGNOSIS — Z96651 Presence of right artificial knee joint: Secondary | ICD-10-CM | POA: Diagnosis present

## 2018-08-06 DIAGNOSIS — J9601 Acute respiratory failure with hypoxia: Secondary | ICD-10-CM | POA: Diagnosis present

## 2018-08-06 DIAGNOSIS — I959 Hypotension, unspecified: Secondary | ICD-10-CM | POA: Diagnosis not present

## 2018-08-06 LAB — CBC WITH DIFFERENTIAL/PLATELET
ABS IMMATURE GRANULOCYTES: 0.06 10*3/uL (ref 0.00–0.07)
BASOS PCT: 1 %
Basophils Absolute: 0.1 10*3/uL (ref 0.0–0.1)
Eosinophils Absolute: 0.3 10*3/uL (ref 0.0–0.5)
Eosinophils Relative: 3 %
HCT: 42.4 % (ref 39.0–52.0)
Hemoglobin: 13.5 g/dL (ref 13.0–17.0)
IMMATURE GRANULOCYTES: 1 %
LYMPHS PCT: 12 %
Lymphs Abs: 1.2 10*3/uL (ref 0.7–4.0)
MCH: 30.9 pg (ref 26.0–34.0)
MCHC: 31.8 g/dL (ref 30.0–36.0)
MCV: 97 fL (ref 80.0–100.0)
MONOS PCT: 12 %
Monocytes Absolute: 1.1 10*3/uL — ABNORMAL HIGH (ref 0.1–1.0)
NEUTROS PCT: 71 %
Neutro Abs: 7 10*3/uL (ref 1.7–7.7)
PLATELETS: 258 10*3/uL (ref 150–400)
RBC: 4.37 MIL/uL (ref 4.22–5.81)
RDW: 12.7 % (ref 11.5–15.5)
WBC: 9.7 10*3/uL (ref 4.0–10.5)
nRBC: 0 % (ref 0.0–0.2)

## 2018-08-06 LAB — LIPASE, BLOOD: Lipase: 33 U/L (ref 11–51)

## 2018-08-06 LAB — COMPREHENSIVE METABOLIC PANEL
ALT: 36 U/L (ref 0–44)
AST: 24 U/L (ref 15–41)
Albumin: 3.3 g/dL — ABNORMAL LOW (ref 3.5–5.0)
Alkaline Phosphatase: 89 U/L (ref 38–126)
Anion gap: 10 (ref 5–15)
BILIRUBIN TOTAL: 1 mg/dL (ref 0.3–1.2)
BUN: 29 mg/dL — AB (ref 8–23)
CO2: 28 mmol/L (ref 22–32)
CREATININE: 1.17 mg/dL (ref 0.61–1.24)
Calcium: 8.6 mg/dL — ABNORMAL LOW (ref 8.9–10.3)
Chloride: 95 mmol/L — ABNORMAL LOW (ref 98–111)
GFR calc Af Amer: >60 mL/min (ref >60)
GFR calc non Af Amer: 55 mL/min — ABNORMAL LOW (ref >60)
Glucose, Bld: 125 mg/dL — ABNORMAL HIGH (ref 70–99)
POTASSIUM: 4.4 mmol/L (ref 3.5–5.1)
Sodium: 133 mmol/L — ABNORMAL LOW (ref 135–145)
TOTAL PROTEIN: 6.5 g/dL (ref 6.5–8.1)

## 2018-08-06 LAB — URINALYSIS, ROUTINE W REFLEX MICROSCOPIC
Bilirubin Urine: NEGATIVE
Glucose, UA: NEGATIVE mg/dL
Hgb urine dipstick: NEGATIVE
Ketones, ur: NEGATIVE mg/dL
LEUKOCYTES UA: NEGATIVE
Nitrite: NEGATIVE
PROTEIN: NEGATIVE mg/dL
Specific Gravity, Urine: 1.012 (ref 1.005–1.030)
pH: 5 (ref 5.0–8.0)

## 2018-08-06 LAB — MRSA PCR SCREENING: MRSA by PCR: NEGATIVE

## 2018-08-06 LAB — INFLUENZA PANEL BY PCR (TYPE A & B)
Influenza A By PCR: NEGATIVE
Influenza B By PCR: NEGATIVE

## 2018-08-06 LAB — TROPONIN I
Troponin I: 0.03 ng/mL (ref <0.03)
Troponin I: 0.05 ng/mL (ref <0.03)

## 2018-08-06 LAB — TSH: TSH: 0.601 u[IU]/mL (ref 0.350–4.500)

## 2018-08-06 LAB — MAGNESIUM: Magnesium: 2.1 mg/dL (ref 1.7–2.4)

## 2018-08-06 LAB — BRAIN NATRIURETIC PEPTIDE: B Natriuretic Peptide: 122.2 pg/mL — ABNORMAL HIGH (ref 0.0–100.0)

## 2018-08-06 LAB — LACTIC ACID, PLASMA: LACTIC ACID, VENOUS: 1.3 mmol/L (ref 0.5–1.9)

## 2018-08-06 MED ORDER — VANCOMYCIN HCL IN DEXTROSE 1-5 GM/200ML-% IV SOLN: 1000.0000 mg | Freq: Once | INTRAVENOUS | Status: DC

## 2018-08-06 MED ORDER — IOPAMIDOL (ISOVUE-370) INJECTION 76%
INTRAVENOUS | Status: AC
  Filled 2018-08-06: qty 100

## 2018-08-06 MED ORDER — BUDESONIDE 0.25 MG/2ML IN SUSP
0.2500 mg | Freq: Two times a day (BID) | RESPIRATORY_TRACT | Status: DC
  Administered 2018-08-06 – 2018-08-10 (×8): 0.25 mg via RESPIRATORY_TRACT
  Filled 2018-08-06 (×8): qty 2

## 2018-08-06 MED ORDER — IPRATROPIUM-ALBUTEROL 0.5-2.5 (3) MG/3ML IN SOLN
3.0000 mL | Freq: Four times a day (QID) | RESPIRATORY_TRACT | Status: DC
  Administered 2018-08-07 – 2018-08-08 (×5): 3 mL via RESPIRATORY_TRACT
  Filled 2018-08-06 (×5): qty 3

## 2018-08-06 MED ORDER — ENOXAPARIN SODIUM 40 MG/0.4ML ~~LOC~~ SOLN
40.0000 mg | SUBCUTANEOUS | Status: DC
  Administered 2018-08-06 – 2018-08-09 (×4): 40 mg via SUBCUTANEOUS
  Filled 2018-08-06 (×4): qty 0.4

## 2018-08-06 MED ORDER — PANTOPRAZOLE SODIUM 40 MG PO TBEC
40.0000 mg | DELAYED_RELEASE_TABLET | Freq: Every day | ORAL | Status: DC
  Administered 2018-08-06 – 2018-08-10 (×5): 40 mg via ORAL
  Filled 2018-08-06 (×5): qty 1

## 2018-08-06 MED ORDER — ACETAMINOPHEN 650 MG RE SUPP: 650.0000 mg | Freq: Four times a day (QID) | RECTAL | Status: DC | PRN

## 2018-08-06 MED ORDER — SODIUM CHLORIDE (PF) 0.9 % IJ SOLN
INTRAMUSCULAR | Status: AC
  Filled 2018-08-06: qty 50

## 2018-08-06 MED ORDER — SODIUM CHLORIDE 0.9 % IV SOLN
500.0000 mg | INTRAVENOUS | Status: DC
  Administered 2018-08-06 – 2018-08-08 (×3): 500 mg via INTRAVENOUS
  Filled 2018-08-06 (×3): qty 500

## 2018-08-06 MED ORDER — IPRATROPIUM-ALBUTEROL 0.5-2.5 (3) MG/3ML IN SOLN
3.0000 mL | Freq: Once | RESPIRATORY_TRACT | Status: AC
  Administered 2018-08-06: 3 mL via RESPIRATORY_TRACT
  Filled 2018-08-06: qty 3

## 2018-08-06 MED ORDER — SODIUM CHLORIDE 0.9 % IV BOLUS
500.0000 mL | Freq: Once | INTRAVENOUS | Status: AC
  Administered 2018-08-06: 500 mL via INTRAVENOUS

## 2018-08-06 MED ORDER — IOPAMIDOL (ISOVUE-300) INJECTION 61%
INTRAVENOUS | Status: AC
  Filled 2018-08-06: qty 100

## 2018-08-06 MED ORDER — DM-GUAIFENESIN ER 30-600 MG PO TB12
1.0000 | ORAL_TABLET | Freq: Two times a day (BID) | ORAL | Status: DC
  Administered 2018-08-07 – 2018-08-10 (×7): 1 via ORAL
  Filled 2018-08-06 (×7): qty 1

## 2018-08-06 MED ORDER — ACETAMINOPHEN 325 MG PO TABS: 650.0000 mg | ORAL_TABLET | Freq: Four times a day (QID) | ORAL | Status: DC | PRN

## 2018-08-06 MED ORDER — ONDANSETRON HCL 4 MG PO TABS: 4.0000 mg | ORAL_TABLET | Freq: Four times a day (QID) | ORAL | Status: DC | PRN

## 2018-08-06 MED ORDER — ONDANSETRON HCL 4 MG/2ML IJ SOLN: 4.0000 mg | Freq: Four times a day (QID) | INTRAMUSCULAR | Status: DC | PRN

## 2018-08-06 MED ORDER — DOCUSATE SODIUM 100 MG PO CAPS: 100.0000 mg | ORAL_CAPSULE | Freq: Two times a day (BID) | ORAL | Status: DC | PRN

## 2018-08-06 MED ORDER — IPRATROPIUM BROMIDE 0.02 % IN SOLN
0.5000 mg | RESPIRATORY_TRACT | Status: DC
  Administered 2018-08-06: 0.5 mg via RESPIRATORY_TRACT
  Filled 2018-08-06: qty 2.5

## 2018-08-06 MED ORDER — VANCOMYCIN HCL 10 G IV SOLR
1500.0000 mg | Freq: Once | INTRAVENOUS | Status: AC
  Administered 2018-08-06: 1500 mg via INTRAVENOUS
  Filled 2018-08-06: qty 1500

## 2018-08-06 MED ORDER — SODIUM CHLORIDE 0.9 % IV SOLN
2.0000 g | INTRAVENOUS | Status: DC
  Administered 2018-08-06 – 2018-08-09 (×4): 2 g via INTRAVENOUS
  Filled 2018-08-06: qty 2
  Filled 2018-08-06: qty 20
  Filled 2018-08-06 (×2): qty 2

## 2018-08-06 MED ORDER — ADULT MULTIVITAMIN W/MINERALS CH
1.0000 | ORAL_TABLET | Freq: Every day | ORAL | Status: DC
  Administered 2018-08-06 – 2018-08-10 (×5): 1 via ORAL
  Filled 2018-08-06 (×5): qty 1

## 2018-08-06 MED ORDER — ENSURE ENLIVE PO LIQD
237.0000 mL | Freq: Two times a day (BID) | ORAL | Status: DC
  Administered 2018-08-07 – 2018-08-10 (×5): 237 mL via ORAL

## 2018-08-06 MED ORDER — IOPAMIDOL (ISOVUE-370) INJECTION 76%
100.0000 mL | Freq: Once | INTRAVENOUS | Status: AC | PRN
  Administered 2018-08-06: 70 mL via INTRAVENOUS

## 2018-08-06 MED ORDER — IOPAMIDOL (ISOVUE-300) INJECTION 61%
100.0000 mL | Freq: Once | INTRAVENOUS | Status: AC | PRN
  Administered 2018-08-06: 75 mL via INTRAVENOUS

## 2018-08-06 MED ORDER — ALBUTEROL SULFATE (2.5 MG/3ML) 0.083% IN NEBU
2.5000 mg | INHALATION_SOLUTION | RESPIRATORY_TRACT | Status: DC
  Administered 2018-08-06: 2.5 mg via RESPIRATORY_TRACT
  Filled 2018-08-06: qty 3

## 2018-08-06 MED ORDER — ASPIRIN EC 81 MG PO TBEC
81.0000 mg | DELAYED_RELEASE_TABLET | Freq: Every day | ORAL | Status: DC
  Administered 2018-08-07 – 2018-08-10 (×4): 81 mg via ORAL
  Filled 2018-08-06 (×4): qty 1

## 2018-08-06 MED ORDER — ALBUTEROL SULFATE (2.5 MG/3ML) 0.083% IN NEBU: 2.5000 mg | INHALATION_SOLUTION | RESPIRATORY_TRACT | Status: DC | PRN

## 2018-08-06 MED ORDER — LOSARTAN POTASSIUM 50 MG PO TABS
100.0000 mg | ORAL_TABLET | Freq: Every day | ORAL | Status: DC
  Administered 2018-08-07 – 2018-08-10 (×4): 100 mg via ORAL
  Filled 2018-08-06 (×4): qty 2

## 2018-08-06 MED ORDER — METHYLPREDNISOLONE SODIUM SUCC 40 MG IJ SOLR
40.0000 mg | Freq: Two times a day (BID) | INTRAMUSCULAR | Status: DC
  Administered 2018-08-06 – 2018-08-09 (×6): 40 mg via INTRAVENOUS
  Filled 2018-08-06 (×6): qty 1

## 2018-08-06 NOTE — ED Triage Notes (Signed)
Patient presented to ed with c/o frequent fall, diarrhea, possible UTI. Patient was seen at PCP on wed and had chest x-ray which was negative.. [place] been vomiting and diarrhea in the past two week and unable to keep anything down. Patient also have a cough and his oxygen sat on room air is 88%. Patient is on 3 L now.

## 2018-08-06 NOTE — ED Notes (Signed)
Pt given urinal and made aware of need for urine specimen 

## 2018-08-06 NOTE — Progress Notes (Signed)
A consult was received from an ED physician for vancomycin per pharmacy dosing.  The patient's profile has been reviewed for ht/wt/allergies/indication/available labs.    A one time order has been placed for vancomycin 1500 mg IV once.  Further antibiotics/pharmacy consults should be ordered by admitting physician if indicated.                       Thank you, Lenis Noon, PharmD 08/06/2018  7:47 PM

## 2018-08-06 NOTE — ED Notes (Signed)
Pt attempting to provide urine specimen

## 2018-08-06 NOTE — ED Notes (Signed)
Patient transported to CT 

## 2018-08-06 NOTE — ED Notes (Signed)
Bed: WA16 Expected date:  Expected time:  Means of arrival:  Comments: 

## 2018-08-06 NOTE — Progress Notes (Signed)
Patient admitted with multiple scabbed or dry open areas:  Left big toe anterior, R Big toe anterior and lateral, R 2nd toe x 2, R lateral foot small scab, Both knees with open areas subsequent to recent falls and covered with bandaids, R lower arm posterior small open area, Right face 2 open areas and right side of nose.   The back has what could be a diffuse red rash, or it may just be his normal status.   Showed to the hospitalist.  No treatment initiated for any of the above areas at this time.

## 2018-08-06 NOTE — Progress Notes (Signed)
Introduced myself to patient and explained reason for my visit and patient is very upset.  He did not want to be awoken for something "silly" and does not want to take breathing treatments.  With nursing assistance patient finally agrees to take tx. But does not want to be bothered the rest of tonight.  Spoke to nurse and relayed discussion and requested that she call MD regarding cough medication.

## 2018-08-06 NOTE — ED Provider Notes (Signed)
Emergency Department Provider Note   I have reviewed the triage vital signs and the nursing notes.   HISTORY  Chief Complaint No chief complaint on file.   HPI William Skinner is a 85 y.o. male with PMH of CAD, HLD, HTN, Dementia, and OA presents to the emergency department for evaluation of hypoxemia, cough, wheezing.  Symptoms have been ongoing for the past 2 weeks and progressively worsening.  Patient lives in assisted living with heavy involvement from family.  The patient's son provides most of the history.  He states that the patient has had several falls which involve abrasions to the bilateral knees.  No concern for head injury.  Patient has had a persistent cough and continued wheezing. He has denied chest pain.  He has been using duo nebs twice daily and 3 days ago started Lasix with some lower extremity swelling that was noted.  EMS was called today with no improvement in symptoms and they noticed hypoxemia to 87% on room air.  He has improved with 3 L nasal cannula but does not typically have an oxygen requirement.  Family also notice diarrhea worsening over the past week.  The patient has not been on antibiotics.  They have not noticed any bright red blood but noticed the color has become more dark.  No fevers. Patient has been refusing his lunch but typically will eat breakfast and peanut butter and jelly without difficulty. Possible nausea with eating and facility as noted some vomiting.   Past Medical History:  Diagnosis Date  . Back pain   . Carotid artery occlusion   . Hyperlipidemia   . Hypertension   . Primary osteoarthritis of knee    right  . Right leg pain     Patient Active Problem List   Diagnosis Date Noted  . Acute respiratory failure ([place]) 08/06/2018  . Generalized weakness 08/06/2018  . Diarrhea of presumed infectious origin 08/06/2018  . Community acquired pneumonia 08/06/2018  . H/O carotid endarterectomy 06/01/2016  . Primary osteoarthritis of  right knee 05/31/2016  . Essential hypertension 05/31/2016  . H/O spinal stenosis 05/31/2016  . Pain in joint, lower leg 03/06/2014    Past Surgical History:  Procedure Laterality Date  . CAROTID ENDARTERECTOMY Left   . CATARACT EXTRACTION Bilateral   . COLONOSCOPY W/ BIOPSIES AND POLYPECTOMY    . HERNIA REPAIR    . KNEE SURGERY Left   . NOSE SURGERY    . TONSILLECTOMY    . TOTAL KNEE ARTHROPLASTY Right 06/15/2016   Procedure: RIGHT TOTAL KNEE ARTHROPLASTY;  Surgeon: Renette Butters, MD;  Location: [facility];  Service: Orthopedics;  Laterality: Right;   Allergies Patient has no known allergies.  Family History  Problem Relation Age of Onset  . Diabetes Mellitus II Neg Hx     Social History Social History   Tobacco Use  . Smoking status: Former Smoker    Last attempt to quit: 03/06/1964    Years since quitting: 54.4  . Smokeless tobacco: Never Used  Substance Use Topics  . Alcohol use: Yes    Comment: occasional beer  . Drug use: No    Review of Systems  Constitutional: No fever/chills Eyes: No visual changes. ENT: No sore throat. Cardiovascular: Denies chest pain. Respiratory: Positive shortness of breath and cough.  Gastrointestinal: No abdominal pain.  Positive nausea, vomiting, and diarrhea.  No constipation. Genitourinary: Negative for dysuria. Musculoskeletal: Negative for back pain. Skin: Negative for rash. Neurological: Negative for headaches, focal weakness  or numbness.  10-point ROS otherwise negative.  ____________________________________________   PHYSICAL EXAM:  VITAL SIGNS: ED Triage Vitals  Enc Vitals Group     BP 08/06/18 1450 (!) 104/42     Pulse Rate 08/06/18 1450 96     Resp 08/06/18 1450 18     Temp 08/06/18 1450 98.8 F (37.1 C)     Temp Source 08/06/18 1450 Oral     SpO2 08/06/18 1449 94 %     Weight 08/06/18 1455 185 lb (83.9 kg)     Height 08/06/18 1455 6' (1.829 m)   Constitutional: Alert but confused. Well appearing and in  no acute distress. Eyes: Conjunctivae are normal.  Head: Atraumatic. Nose: No congestion/rhinnorhea. Mouth/Throat: Mucous membranes are dry.  Neck: No stridor. Cardiovascular: Normal rate, regular rhythm. Good peripheral circulation. Grossly normal heart sounds.   Respiratory: Normal respiratory effort.  No retractions. Lungs CTAB. Gastrointestinal: Soft and nontender. No distention.  Musculoskeletal: No lower extremity tenderness with trace edema. No gross deformities of extremities. Neurologic:  Normal speech and language. No gross focal neurologic deficits are appreciated.  Skin:  Skin is warm, dry and intact. No rash noted.  ____________________________________________   LABS (all labs ordered are listed, but only abnormal results are displayed)  Labs Reviewed  COMPREHENSIVE METABOLIC PANEL - Abnormal; Notable for the following components:      Result Value   Sodium 133 (*)    Chloride 95 (*)    Glucose, Bld 125 (*)    BUN 29 (*)    Calcium 8.6 (*)    Albumin 3.3 (*)    GFR calc non Af Amer 55 (*)    All other components within normal limits  BRAIN NATRIURETIC PEPTIDE - Abnormal; Notable for the following components:   B Natriuretic Peptide 122.2 (*)    All other components within normal limits  TROPONIN I - Abnormal; Notable for the following components:   Troponin I 0.03 (*)    All other components within normal limits  CBC WITH DIFFERENTIAL/PLATELET - Abnormal; Notable for the following components:   Monocytes Absolute 1.1 (*)    All other components within normal limits  URINE CULTURE  CULTURE, BLOOD (ROUTINE X 2)  CULTURE, BLOOD (ROUTINE X 2)  MRSA PCR SCREENING  EXPECTORATED SPUTUM ASSESSMENT W REFEX TO RESP CULTURE  GRAM STAIN  RESPIRATORY PANEL BY PCR  LIPASE, BLOOD  LACTIC ACID, PLASMA  URINALYSIS, ROUTINE W REFLEX MICROSCOPIC  INFLUENZA PANEL BY PCR (TYPE A & B)  STREP PNEUMONIAE URINARY ANTIGEN  BASIC METABOLIC PANEL  CBC  LEGIONELLA PNEUMOPHILA  SEROGP 1 UR AG  MAGNESIUM  TSH  TROPONIN I  TROPONIN I  TROPONIN I  HEPATIC FUNCTION PANEL   ____________________________________________  EKG   EKG Interpretation  Date/Time:  Sunday August 06 2018 15:37:25 EST Ventricular Rate:  96 PR Interval:    QRS Duration: 143 QT Interval:  373 QTC Calculation: 472 R Axis:   90 Text Interpretation:  Sinus rhythm Atrial premature complex Nonspecific intraventricular conduction delay Minimal ST depression, inferior leads No STEMI.  Confirmed by Nanda Quinton [id number]) on 08/06/2018 3:47:45 PM       ____________________________________________  RADIOLOGY  Dg Chest 2 View  Result Date: 08/06/2018 CLINICAL DATA:  Productive cough for 2 days.  Shortness of breath. EXAM: CHEST - 2 VIEW COMPARISON:  None. FINDINGS: Cardiomegaly. Tortuous thoracic aorta. The hila and mediastinum otherwise unremarkable. Mild opacity in the lateral left lung base favored represent atelectasis or scar rather than infiltrate.  No other acute abnormalities identified. IMPRESSION: Opacity in the lateral left lung base favored to represent atelectasis or scar rather than infiltrate. No other acute abnormalities. Electronically Signed   By: Dorise Bullion III M.D   On: 08/06/2018 15:36   Ct Angio Chest Pe W And/or Wo Contrast  Result Date: 08/06/2018 CLINICAL DATA:  Patient presented to ed with c/o frequent fall, diarrhea, possible UTI. Patient was seen at PCP on wed and had chest x-ray which was negative. Patient lives in morning-view patient been vomiting and diarrhea in the past two week.*comment was truncated*^12mL ISOVUE-370 IOPAMIDOL (ISOVUE-370) INJECTION 76%PE suspected, high pretest prob EXAM: CT ANGIOGRAPHY CHEST WITH CONTRAST TECHNIQUE: Multidetector CT imaging of the chest was performed using the standard protocol during bolus administration of intravenous contrast. Multiplanar CT image reconstructions and MIPs were obtained to evaluate the vascular anatomy.  CONTRAST:  38mL ISOVUE-370 IOPAMIDOL (ISOVUE-370) INJECTION 76% COMPARISON:  Radiograph 08/06/2018 FINDINGS: Cardiovascular: No filling defects within the pulmonary arteries to suggest acute pulmonary embolism. No acute findings of the aorta or great vessels. No pericardial fluid. Mediastinum/Nodes: No axillary or supraclavicular adenopathy. No mediastinal hilar adenopathy. No pericardial effusion. Esophagus normal. Lungs/Pleura: No pulmonary infarction.  No pneumonia. LEFT upper lobe nodule measuring 9 mm by 9 mm (image 58/10 has irregular margin a somewhat branching pattern (image 75/8). There is atelectasis in the medial aspect of the RIGHT lower lobe (image 77/10) Upper Abdomen: There is gas within the wall a loop of bowel anterior to the liver (image 89/4) favored ascending colon. Potential small focus of gas within the portal system of the liver (image 86 and 88 of series 4) within the RIGHT hepatic lobe. These findings are concerning for pneumatosis intestinalis and portal venous gas. Musculoskeletal: No aggressive osseous lesion. Review of the MIP images confirms the above findings. IMPRESSION: 1. Pneumatosis intestinalis of the ascending colon and potential portal venous gas. Recommend CT of the abdomen pelvis to evaluate for ischemia versus benign pneumatosis intestinalis. 2. No evidence of acute pulmonary embolism. 3. Small focus consolidation in the medial RIGHT lower lobe is favored atelectasis. Can not exclude a small focus of infection. 4. Irregular nodule in the LEFT upper lobe with differential including small focus infection versus neoplasm. Favor infection. Recommend follow-up CT thorax in 4-6 weeks to re-evaluate. Findings conveyed toJOSHUA Xara Paulding on 08/06/2018  at18:01. Electronically Signed   By: Suzy Bouchard M.D.   On: 08/06/2018 18:01   Ct Abdomen Pelvis W Contrast  Result Date: 08/06/2018 CLINICAL DATA:  Diarrhea and possible UTI. EXAM: CT ABDOMEN AND PELVIS WITH CONTRAST TECHNIQUE:  Multidetector CT imaging of the abdomen and pelvis was performed using the standard protocol following bolus administration of intravenous contrast. CONTRAST:  3mL ISOVUE-300 IOPAMIDOL (ISOVUE-300) INJECTION 61% COMPARISON:  None. FINDINGS: Lower chest: Mild peripheral scar of the lung bases are noted. The heart size is enlarged. Hepatobiliary: No focal lesions identified in the liver. The gallbladder is decompressed limiting evaluation. No biliary ductal dilatation is identified. Pancreas: Unremarkable. No pancreatic ductal dilatation or surrounding inflammatory changes. Spleen: Normal in size without focal abnormality. Adrenals/Urinary Tract: The bilateral adrenal glands are normal. Simple cysts are noted in both kidneys. Bilateral parapelvic renal cysts are identified. There is no hydronephrosis bilaterally. The bladder is normal. Stomach/Bowel: Stomach is within normal limits. The appendix is not seen. No inflammatory changes noted around the cecum. No evidence of bowel wall thickening, distention, or inflammatory changes. Vascular/Lymphatic: Atherosclerosis of the aorta is identified. Mild ectasia of distal abdominal aorta is  noted. There is no abdominal lymphadenopathy. Reproductive: Prostate is unremarkable. Other: None. Musculoskeletal: Degenerative joint changes of the spine are identified. Chronic compression deformity of L5 is noted. IMPRESSION: No bowel obstruction or diverticulitis identified. Bilateral kidney cysts.  No hydronephrosis bilaterally. No acute abnormality identified in the abdomen and pelvis. Electronically Signed   By: Abelardo Diesel M.D.   On: 08/06/2018 19:14    ____________________________________________   PROCEDURES  Procedure(s) performed:   Procedures  CRITICAL CARE Performed by: Margette Fast Total critical care time: 35 minutes Critical care time was exclusive of separately billable procedures and treating other patients. Critical care was necessary to treat or  prevent imminent or life-threatening deterioration. Critical care was time spent personally by me on the following activities: development of treatment plan with patient and/or surrogate as well as nursing, discussions with consultants, evaluation of patient's response to treatment, examination of patient, obtaining history from patient or surrogate, ordering and performing treatments and interventions, ordering and review of laboratory studies, ordering and review of radiographic studies, pulse oximetry and re-evaluation of patient's condition.  Nanda Quinton, MD Emergency Medicine  ____________________________________________   INITIAL IMPRESSION / ASSESSMENT AND PLAN / ED COURSE  Pertinent labs & imaging results that were available during my care of the patient were reviewed by me and considered in my medical decision making (see chart for details).  Patient presents to the emergency department primarily with respiratory complaints.  New oxygen requirement here of 3 L.  Recently started Lasix with some improvement in lower extremity swelling.  Patient does have end expiratory wheezing on exam.  Will give DuoNeb.  Chest x-ray reviewed with question atelectasis versus scar on the left.  Patient is afebrile here.  Plan for screening labs, IV fluids, reassess after neb.  No abdominal tenderness to suspect acute intra-abdominal pathology.  No antibiotics to increase risk for C. Difficile.   Patient remains awake and alert. Labs with mild troponin elevation but will follow. Doubt ACS. CTA obtained with hypoxemia and SOB of unclear etiology. PNA suspected on CT. No PE. Radiology questions some air in the bowel wall and recommends return for CT abdomen/pelvis.   CT abdomen/pelvis without acute findings. Plan for abx and admit for further treatment.   Discussed patient's case with Hospitalist, Dr. Delford Field to request admission. Patient and family (if present) updated with plan. Care transferred to  Hospitalist service.  I reviewed all nursing notes, vitals, pertinent old records, EKGs, labs, imaging (as available).  ____________________________________________  FINAL CLINICAL IMPRESSION(S) / ED DIAGNOSES  Final diagnoses:  Community acquired pneumonia, unspecified laterality  Hypoxemia  Diarrhea of presumed infectious origin  Generalized weakness     MEDICATIONS GIVEN DURING THIS VISIT:  Medications  sodium chloride (PF) 0.9 % injection (has no administration in time range)  iopamidol (ISOVUE-370) 76 % injection (has no administration in time range)  iopamidol (ISOVUE-300) 61 % injection (has no administration in time range)  sodium chloride (PF) 0.9 % injection (has no administration in time range)  cefTRIAXone (ROCEPHIN) 2 g in sodium chloride 0.9 % 100 mL IVPB (2 g Intravenous New Bag/Given 08/06/18 1936)  azithromycin (ZITHROMAX) 500 mg in sodium chloride 0.9 % 250 mL IVPB (500 mg Intravenous New Bag/Given 08/06/18 1939)  vancomycin (VANCOCIN) 1,500 mg in sodium chloride 0.9 % 500 mL IVPB (1,500 mg Intravenous New Bag/Given 08/06/18 2133)  aspirin EC tablet 81 mg (has no administration in time range)  losartan (COZAAR) tablet 100 mg (has no administration in time range)  docusate  sodium (COLACE) capsule 100 mg (has no administration in time range)  multivitamin with minerals tablet 1 tablet (has no administration in time range)  acetaminophen (TYLENOL) tablet 650 mg (has no administration in time range)    Or  acetaminophen (TYLENOL) suppository 650 mg (has no administration in time range)  ondansetron (ZOFRAN) tablet 4 mg (has no administration in time range)    Or  ondansetron (ZOFRAN) injection 4 mg (has no administration in time range)  enoxaparin (LOVENOX) injection 40 mg (has no administration in time range)  albuterol (PROVENTIL) (2.5 MG/3ML) 0.083% nebulizer solution 2.5 mg (2.5 mg Nebulization Given 08/06/18 2237)  albuterol (PROVENTIL) (2.5 MG/3ML) 0.083%  nebulizer solution 2.5 mg (has no administration in time range)  ipratropium (ATROVENT) nebulizer solution 0.5 mg (0.5 mg Nebulization Given 08/06/18 2237)  pantoprazole (PROTONIX) EC tablet 40 mg (has no administration in time range)  methylPREDNISolone sodium succinate (SOLU-MEDROL) 40 mg/mL injection 40 mg (has no administration in time range)  budesonide (PULMICORT) nebulizer solution 0.25 mg (0.25 mg Nebulization Given 08/06/18 2237)  sodium chloride 0.9 % bolus 500 mL (0 mLs Intravenous Stopped 08/06/18 1707)  ipratropium-albuterol (DUONEB) 0.5-2.5 (3) MG/3ML nebulizer solution 3 mL (3 mLs Nebulization Given 08/06/18 1607)  iopamidol (ISOVUE-370) 76 % injection 100 mL (70 mLs Intravenous Contrast Given 08/06/18 1720)  iopamidol (ISOVUE-300) 61 % injection 100 mL (75 mLs Intravenous Contrast Given 08/06/18 1837)    Note:  This document was prepared using Dragon voice recognition software and may include unintentional dictation errors.  Nanda Quinton, MD Emergency Medicine    Lorcan Shelp, [facility], MD 08/06/18 2242

## 2018-08-06 NOTE — H&P (Signed)
History and Physical    William Skinner:[id number] DOB: 02/27/1926 DOA: 08/06/2018  PCP: Lawerance Cruel, MD  Patient coming from: Assisted living facility.  Chief Complaint: Shortness of breath.  History was obtained from the ER physician obtain history from patient's son.  Patient also contributed to history.  HPI: William Skinner is a 85 y.o. male with history of hypertension, carotid endarterectomy possible dementia was brought to the ER patient was persistently short of breath with hypoxia for the last 2 weeks.  As per the report from the ER physician patient was placed on nebulizer treatment last 3 days and Lasix was eventually added for lower extremity edema.  Despite this patient was still short of breath hypoxic with wheezing.  EMS was called and patient has been placed on 3 L oxygen and brought to the ER.  Patient has been also having frequent falls recently and has had a skin abrasion of the lower extremities.  There was no reports of any head injury or loss of consciousness.  Patient states he also has been having poor appetite over the last few months.  ED Course: In the ER patient was hypoxic and wheezing bilaterally with CT angiogram of the chest done showing negative for pulmonary embolism with possibility of right middle lobe pneumonia.  Since there was some concern for pneumocystis coli dedicated CAT scan was ordered for the abdomen and pelvis which was negative for anything acute.  Troponin mildly elevated BNP 122 WBC count was 9.7.  Patient was placed on nebulizer treatment antibiotics and admitted for acute respiratory failure likely from bronchitis and possible pneumonia.  Abdominal exam appears benign.  Review of Systems: As per HPI, rest all negative.   Past Medical History:  Diagnosis Date  . Back pain   . Carotid artery occlusion   . Hyperlipidemia   . Hypertension   . Primary osteoarthritis of knee    right  . Right leg pain     Past Surgical History:    Procedure Laterality Date  . CAROTID ENDARTERECTOMY Left   . CATARACT EXTRACTION Bilateral   . COLONOSCOPY W/ BIOPSIES AND POLYPECTOMY    . HERNIA REPAIR    . KNEE SURGERY Left   . NOSE SURGERY    . TONSILLECTOMY    . TOTAL KNEE ARTHROPLASTY Right 06/15/2016   Procedure: RIGHT TOTAL KNEE ARTHROPLASTY;  Surgeon: Renette Butters, MD;  Location: [facility];  Service: Orthopedics;  Laterality: Right;     reports that he quit smoking about 54 years ago. He has never used smokeless tobacco. He reports current alcohol use. He reports that he does not use drugs.  No Known Allergies  Family History  Problem Relation Age of Onset  . Diabetes Mellitus II Neg Hx     Prior to Admission medications   Medication Sig Start Date End Date Taking? Authorizing Provider  acetaminophen (TYLENOL) 500 MG tablet Take 1,000 mg by mouth 3 (three) times daily as needed for moderate pain or headache. Reported on 07/29/2015   Yes [provider]  albuterol (PROVENTIL HFA;VENTOLIN HFA) 108 (90 Base) MCG/ACT inhaler Inhale 2 puffs into the lungs every 6 (six) hours as needed for wheezing or shortness of breath.   Yes [provider]  albuterol (PROVENTIL) (2.5 MG/3ML) 0.083% nebulizer solution Take 2.5 mg by nebulization every 4 (four) hours as needed for wheezing or shortness of breath.   Yes [provider]  aspirin 81 MG tablet Take 81 mg by mouth  daily.    Yes [provider]  clobetasol cream (TEMOVATE) 9.56 % Apply 1 application topically 2 (two) times daily. Apply daily or bid to Bilteral lower extremity rash. Patient taking differently: Apply 1 application topically 2 (two) times daily as needed (Bilateral Lower Extremity Rash PRN).  08/19/16  Yes Tanna Furry, MD  docusate sodium (COLACE) 100 MG capsule Take 1 capsule (100 mg total) by mouth 2 (two) times daily. Patient taking differently: Take 100 mg by mouth 2 (two) times daily as needed for mild constipation.  06/15/16  Yes  Prudencio Burly III, PA-C  furosemide (LASIX) 40 MG tablet Take 40 mg by mouth 2 (two) times daily. 08/03/18 08/09/18 Yes [provider]  ipratropium-albuterol (DUONEB) 0.5-2.5 (3) MG/3ML SOLN Take 3 mLs by nebulization 2 (two) times daily.   Yes [provider]  ipratropium-albuterol (DUONEB) 0.5-2.5 (3) MG/3ML SOLN Take 3 mLs by nebulization 4 (four) times daily as needed (shortness of breathe.).   Yes [provider]  loperamide (IMODIUM A-D) 2 MG capsule Take 2 mg by mouth every 4 (four) hours as needed for diarrhea or loose stools.   Yes [provider]  losartan (COZAAR) 100 MG tablet Take 100 mg by mouth daily.   Yes [provider]  Multiple Vitamin (MULTIVITAMIN WITH MINERALS) TABS tablet Take 1 tablet by mouth daily.   Yes [provider]  ondansetron (ZOFRAN) 4 MG tablet Take 1 tablet (4 mg total) by mouth every 8 (eight) hours as needed for nausea or vomiting. Patient taking differently: Take 4 mg by mouth every 6 (six) hours as needed for nausea or vomiting.  06/15/16  Yes Martensen, Charna Elizabeth III, PA-C  baclofen (LIORESAL) 10 MG tablet Take 1 tablet (10 mg total) by mouth 3 (three) times daily as needed for muscle spasms. Patient not taking: Reported on 02/16/2018 06/15/16   Prudencio Burly III, PA-C  cephALEXin (KEFLEX) 500 MG capsule Take 1 capsule (500 mg total) by mouth 3 (three) times daily. Patient not taking: Reported on 02/16/2018 08/19/16   Tanna Furry, MD  HYDROcodone-acetaminophen [facility]) 5-325 MG tablet Take 1-2 tablets by mouth every 4 (four) hours as needed for moderate pain. Patient not taking: Reported on 02/16/2018 06/15/16   Prudencio Burly III, PA-C  rivaroxaban (XARELTO) 10 MG TABS tablet Take 1 tablet (10 mg total) by mouth daily. Patient not taking: Reported on 08/19/2016 06/15/16   Prudencio Burly III, PA-C    Physical Exam: Vitals:   08/06/18 1705 08/06/18 1814 08/06/18 2005  08/06/18 2052  BP: (!) 132/109 110/62 (!) 116/44 118/60  Pulse: (!) 102 94 93 85  Resp: (!) 22 (!) 21 20 20   Temp:    98.2 F (36.8 C)  TempSrc:    Oral  SpO2: 94% 95% 96% 93%  Weight:      Height:          Constitutional: Moderately built and nourished. Vitals:   08/06/18 1705 08/06/18 1814 08/06/18 2005 08/06/18 2052  BP: (!) 132/109 110/62 (!) 116/44 118/60  Pulse: (!) 102 94 93 85  Resp: (!) 22 (!) 21 20 20   Temp:    98.2 F (36.8 C)  TempSrc:    Oral  SpO2: 94% 95% 96% 93%  Weight:      Height:       Eyes: Anicteric no pallor. ENMT: No discharge from the ears eyes nose or mouth. Neck: No mass felt.  No neck rigidity. Respiratory: Bilateral expiratory wheeze and  no crepitations. Cardiovascular: S1-S2 heard. Abdomen: Soft nontender bowel sounds present. Musculoskeletal: No edema.  No joint effusion. Skin: Multiple abrasions of the lower extremities. Neurologic: Alert awake oriented to his name and place moves all extremities. Psychiatric: Oriented to his name and place.   Labs on Admission: I have personally reviewed following labs and imaging studies  CBC: Recent Labs  Lab 08/06/18 1517  WBC 9.7  NEUTROABS 7.0  HGB 13.5  HCT 42.4  MCV 97.0  PLT 109   Basic Metabolic Panel: Recent Labs  Lab 08/06/18 1517  NA 133*  K 4.4  CL 95*  CO2 28  GLUCOSE 125*  BUN 29*  CREATININE 1.17  CALCIUM 8.6*   GFR: Estimated Creatinine Clearance: 46.1 mL/min (by C-G formula based on SCr of 1.17 mg/dL). Liver Function Tests: Recent Labs  Lab 08/06/18 1517  AST 24  ALT 36  ALKPHOS 89  BILITOT 1.0  PROT 6.5  ALBUMIN 3.3*   Recent Labs  Lab 08/06/18 1517  LIPASE 33   No results for input(s): AMMONIA in the last 168 hours. Coagulation Profile: No results for input(s): INR, PROTIME in the last 168 hours. Cardiac Enzymes: Recent Labs  Lab 08/06/18 1517  TROPONINI 0.03*   BNP (last 3 results) No results for input(s): PROBNP in the last 8760  hours. HbA1C: No results for input(s): HGBA1C in the last 72 hours. CBG: No results for input(s): GLUCAP in the last 168 hours. Lipid Profile: No results for input(s): CHOL, HDL, LDLCALC, TRIG, CHOLHDL, LDLDIRECT in the last 72 hours. Thyroid Function Tests: No results for input(s): TSH, T4TOTAL, FREET4, T3FREE, THYROIDAB in the last 72 hours. Anemia Panel: No results for input(s): VITAMINB12, FOLATE, FERRITIN, TIBC, IRON, RETICCTPCT in the last 72 hours. Urine analysis:    Component Value Date/Time   COLORURINE YELLOW 08/06/2018 [place] 08/06/2018 1518   LABSPEC 1.012 08/06/2018 1518   PHURINE 5.0 08/06/2018 1518   GLUCOSEU NEGATIVE 08/06/2018 1518   HGBUR NEGATIVE 08/06/2018 1518   BILIRUBINUR NEGATIVE 08/06/2018 1518   [place] 08/06/2018 1518   PROTEINUR NEGATIVE 08/06/2018 1518   NITRITE NEGATIVE 08/06/2018 1518   LEUKOCYTESUR NEGATIVE 08/06/2018 1518   Sepsis Labs: @LABRCNTIP (procalcitonin:4,lacticidven:4) )No results found for this or any previous visit (from the past 240 hour(s)).   Radiological Exams on Admission: Dg Chest 2 View  Result Date: 08/06/2018 CLINICAL DATA:  Productive cough for 2 days.  Shortness of breath. EXAM: CHEST - 2 VIEW COMPARISON:  None. FINDINGS: Cardiomegaly. Tortuous thoracic aorta. The hila and mediastinum otherwise unremarkable. Mild opacity in the lateral left lung base favored represent atelectasis or scar rather than infiltrate. No other acute abnormalities identified. IMPRESSION: Opacity in the lateral left lung base favored to represent atelectasis or scar rather than infiltrate. No other acute abnormalities. Electronically Signed   By: Dorise Bullion III M.D   On: 08/06/2018 15:36   Ct Angio Chest Pe W And/or Wo Contrast  Result Date: 08/06/2018 CLINICAL DATA:  Patient presented to ed with c/o frequent fall, diarrhea, possible UTI. Patient was seen at PCP on wed and had chest x-ray which was negative. Patient  lives in morning-view patient been vomiting and diarrhea in the past two week.*comment was truncated*^72mL ISOVUE-370 IOPAMIDOL (ISOVUE-370) INJECTION 76%PE suspected, high pretest prob EXAM: CT ANGIOGRAPHY CHEST WITH CONTRAST TECHNIQUE: Multidetector CT imaging of the chest was performed using the standard protocol during bolus administration of intravenous contrast. Multiplanar CT image reconstructions and MIPs were obtained to evaluate the vascular  anatomy. CONTRAST:  60mL ISOVUE-370 IOPAMIDOL (ISOVUE-370) INJECTION 76% COMPARISON:  Radiograph 08/06/2018 FINDINGS: Cardiovascular: No filling defects within the pulmonary arteries to suggest acute pulmonary embolism. No acute findings of the aorta or great vessels. No pericardial fluid. Mediastinum/Nodes: No axillary or supraclavicular adenopathy. No mediastinal hilar adenopathy. No pericardial effusion. Esophagus normal. Lungs/Pleura: No pulmonary infarction.  No pneumonia. LEFT upper lobe nodule measuring 9 mm by 9 mm (image 58/10 has irregular margin a somewhat branching pattern (image 75/8). There is atelectasis in the medial aspect of the RIGHT lower lobe (image 77/10) Upper Abdomen: There is gas within the wall a loop of bowel anterior to the liver (image 89/4) favored ascending colon. Potential small focus of gas within the portal system of the liver (image 86 and 88 of series 4) within the RIGHT hepatic lobe. These findings are concerning for pneumatosis intestinalis and portal venous gas. Musculoskeletal: No aggressive osseous lesion. Review of the MIP images confirms the above findings. IMPRESSION: 1. Pneumatosis intestinalis of the ascending colon and potential portal venous gas. Recommend CT of the abdomen pelvis to evaluate for ischemia versus benign pneumatosis intestinalis. 2. No evidence of acute pulmonary embolism. 3. Small focus consolidation in the medial RIGHT lower lobe is favored atelectasis. Can not exclude a small focus of infection. 4.  Irregular nodule in the LEFT upper lobe with differential including small focus infection versus neoplasm. Favor infection. Recommend follow-up CT thorax in 4-6 weeks to re-evaluate. Findings conveyed toJOSHUA LONG on 08/06/2018  at18:01. Electronically Signed   By: Suzy Bouchard M.D.   On: 08/06/2018 18:01   Ct Abdomen Pelvis W Contrast  Result Date: 08/06/2018 CLINICAL DATA:  Diarrhea and possible UTI. EXAM: CT ABDOMEN AND PELVIS WITH CONTRAST TECHNIQUE: Multidetector CT imaging of the abdomen and pelvis was performed using the standard protocol following bolus administration of intravenous contrast. CONTRAST:  13mL ISOVUE-300 IOPAMIDOL (ISOVUE-300) INJECTION 61% COMPARISON:  None. FINDINGS: Lower chest: Mild peripheral scar of the lung bases are noted. The heart size is enlarged. Hepatobiliary: No focal lesions identified in the liver. The gallbladder is decompressed limiting evaluation. No biliary ductal dilatation is identified. Pancreas: Unremarkable. No pancreatic ductal dilatation or surrounding inflammatory changes. Spleen: Normal in size without focal abnormality. Adrenals/Urinary Tract: The bilateral adrenal glands are normal. Simple cysts are noted in both kidneys. Bilateral parapelvic renal cysts are identified. There is no hydronephrosis bilaterally. The bladder is normal. Stomach/Bowel: Stomach is within normal limits. The appendix is not seen. No inflammatory changes noted around the cecum. No evidence of bowel wall thickening, distention, or inflammatory changes. Vascular/Lymphatic: Atherosclerosis of the aorta is identified. Mild ectasia of distal abdominal aorta is noted. There is no abdominal lymphadenopathy. Reproductive: Prostate is unremarkable. Other: None. Musculoskeletal: Degenerative joint changes of the spine are identified. Chronic compression deformity of L5 is noted. IMPRESSION: No bowel obstruction or diverticulitis identified. Bilateral kidney cysts.  No hydronephrosis  bilaterally. No acute abnormality identified in the abdomen and pelvis. Electronically Signed   By: Abelardo Diesel M.D.   On: 08/06/2018 19:14    EKG: Independently reviewed.  Normal sinus rhythm with APC and nonspecific ST-T changes.  Assessment/Plan Principal Problem:   Acute respiratory failure (HCC) Active Problems:   Essential hypertension   Generalized weakness   Diarrhea of presumed infectious origin   Community acquired pneumonia    1. Acute respiratory failure with hypoxia likely from bronchitis with possible pneumonia.  Patient has been placed on empiric antibiotics for pneumonia and will check cultures including sputum cultures  check respiratory viral panel continue on nebulizer treatment along with Pulmicort and IV Solu-Medrol. 2. Elevated troponin denies any chest pain.  We will cycle cardiac markers keep patient on aspirin.  Check 2D echo. 3. Hypertension on ARB. 4. Poor appetite with concern for recent diarrhea -we will closely observe.  CT abdomen and pelvis done did not show anything acute in the abdomen.  Patient states he sometimes regurgitates when he eats will get a swallow evaluation. 5. Lung nodule seen in the CAT scan will need follow-up. 6. Possible dementia. 7. Multiple skin abrasions or lower extremity with falls.  Will need physical therapy consult.  There is also a rash in the upper back which is not itching and nonblanching.  Will observe.   DVT prophylaxis: Lovenox. Code Status: DNR as per the patient.  Will need to be confirmed with patient's son. Family Communication: No family at the bedside at this time. Disposition Plan: To be determined. Consults called: Physical therapy. Admission status: Inpatient.   Rise Patience MD [facility] Pager [phone].  If 7PM-7AM, please contact night-coverage [email or web address] Password [facility]  08/06/2018, 9:58 PM

## 2018-08-07 ENCOUNTER — Inpatient Hospital Stay (HOSPITAL_COMMUNITY): Payer: Medicare Other

## 2018-08-07 DIAGNOSIS — J9600 Acute respiratory failure, unspecified whether with hypoxia or hypercapnia: Secondary | ICD-10-CM

## 2018-08-07 LAB — RESPIRATORY PANEL BY PCR

## 2018-08-07 LAB — BASIC METABOLIC PANEL
Anion gap: 8 (ref 5–15)
BUN: 26 mg/dL — ABNORMAL HIGH (ref 8–23)
CALCIUM: 8.2 mg/dL — AB (ref 8.9–10.3)
CO2: 25 mmol/L (ref 22–32)
CREATININE: 1.2 mg/dL (ref 0.61–1.24)
Chloride: 102 mmol/L (ref 98–111)
GFR calc Af Amer: >60 mL/min (ref >60)
GFR calc non Af Amer: 53 mL/min — ABNORMAL LOW (ref >60)
Glucose, Bld: 161 mg/dL — ABNORMAL HIGH (ref 70–99)
Potassium: 4.3 mmol/L (ref 3.5–5.1)
Sodium: 135 mmol/L (ref 135–145)

## 2018-08-07 LAB — ECHOCARDIOGRAM COMPLETE
Height: 72 in
Weight: 3040.58 oz

## 2018-08-07 LAB — HEPATIC FUNCTION PANEL
ALT: 29 U/L (ref 0–44)
AST: 21 U/L (ref 15–41)
Albumin: 2.7 g/dL — ABNORMAL LOW (ref 3.5–5.0)
Alkaline Phosphatase: 70 U/L (ref 38–126)
BILIRUBIN INDIRECT: 0.4 mg/dL (ref 0.3–0.9)
Bilirubin, Direct: 0.1 mg/dL (ref 0.0–0.2)
Total Bilirubin: 0.5 mg/dL (ref 0.3–1.2)
Total Protein: 5.4 g/dL — ABNORMAL LOW (ref 6.5–8.1)

## 2018-08-07 LAB — CBC
HCT: 38.7 % — ABNORMAL LOW (ref 39.0–52.0)
Hemoglobin: 12.1 g/dL — ABNORMAL LOW (ref 13.0–17.0)
MCH: 29.6 pg (ref 26.0–34.0)
MCHC: 31.3 g/dL (ref 30.0–36.0)
MCV: 94.6 fL (ref 80.0–100.0)
Platelets: 227 10*3/uL (ref 150–400)
RBC: 4.09 MIL/uL — ABNORMAL LOW (ref 4.22–5.81)
RDW: 12.8 % (ref 11.5–15.5)
WBC: 6.4 10*3/uL (ref 4.0–10.5)
nRBC: 0 % (ref 0.0–0.2)

## 2018-08-07 LAB — EXPECTORATED SPUTUM ASSESSMENT W GRAM STAIN, RFLX TO RESP C

## 2018-08-07 LAB — EXPECTORATED SPUTUM ASSESSMENT W REFEX TO RESP CULTURE

## 2018-08-07 LAB — TROPONIN I
Troponin I: 0.05 ng/mL (ref <0.03)
Troponin I: 0.04 ng/mL (ref <0.03)

## 2018-08-07 LAB — STREP PNEUMONIAE URINARY ANTIGEN: Strep Pneumo Urinary Antigen: NEGATIVE

## 2018-08-07 LAB — URINE CULTURE: SPECIAL REQUESTS: NORMAL

## 2018-08-07 MED ORDER — HYDROGEN PEROXIDE 3 % EX SOLN
CUTANEOUS | Status: AC
  Filled 2018-08-07: qty 473

## 2018-08-07 MED ORDER — ORAL CARE MOUTH RINSE
15.0000 mL | Freq: Two times a day (BID) | OROMUCOSAL | Status: DC
  Administered 2018-08-07 – 2018-08-08 (×3): 15 mL via OROMUCOSAL

## 2018-08-07 MED ORDER — CHLORHEXIDINE GLUCONATE 0.12 % MT SOLN
15.0000 mL | Freq: Two times a day (BID) | OROMUCOSAL | Status: DC
  Administered 2018-08-07 – 2018-08-10 (×6): 15 mL via OROMUCOSAL
  Filled 2018-08-07 (×6): qty 15

## 2018-08-07 MED ORDER — PERFLUTREN LIPID MICROSPHERE
1.0000 mL | INTRAVENOUS | Status: AC | PRN
  Administered 2018-08-07: 2 mL via INTRAVENOUS
  Filled 2018-08-07: qty 10

## 2018-08-07 NOTE — Progress Notes (Signed)
CRITICAL VALUE ALERT  Critical Value: 0.05  Troponin   Date & Time Notied:  08/07/2018 at 5:15  Provider Notified: Kennon Holter, Hospitalist via text at [phone]  Orders Received/Actions taken: waiting response

## 2018-08-07 NOTE — Progress Notes (Signed)
PROGRESS NOTE    William Skinner  DSK:[id number] DOB: 09-Jul-1926 DOA: 08/06/2018 PCP: Lawerance Cruel, MD  Brief Narrative:85 y.o. male with history of hypertension, carotid endarterectomy possible dementia was brought to the ER patient was persistently short of breath with hypoxia for the last 2 weeks.  As per the report from the ER physician patient was placed on nebulizer treatment last 3 days and Lasix was eventually added for lower extremity edema.  Despite this patient was still short of breath hypoxic with wheezing.  EMS was called and patient has been placed on 3 L oxygen and brought to the ER.  Patient has been also having frequent falls recently and has had a skin abrasion of the lower extremities.  There was no reports of any head injury or loss of consciousness.  Patient states he also has been having poor appetite over the last few months.  ED Course: In the ER patient was hypoxic and wheezing bilaterally with CT angiogram of the chest done showing negative for pulmonary embolism with possibility of right middle lobe pneumonia.  Since there was some concern for pneumocystis coli dedicated CAT scan was ordered for the abdomen and pelvis which was negative for anything acute.  Troponin mildly elevated BNP 122 WBC count was 9.7.  Patient was placed on nebulizer treatment antibiotics and admitted for acute respiratory failure likely from bronchitis and possible pneumonia.  Abdominal exam appears benign.  Assessment & Plan:   Principal Problem:   Acute respiratory failure (HCC) Active Problems:   Essential hypertension   Generalized weakness   Diarrhea of presumed infectious origin   Community acquired pneumonia  #1 acute hypoxic respiratory failure secondary to pneumonia bronchitis.  Patient is on 2 L of oxygen at this time.  Awake and alert.  Eating breakfast.  He reports the breathing may have been a little better than yesterday.  Continues to struggle with shortness of breath.  RSV  panel has been sent it is pending at this time follow-up.  Continue empiric antibiotics and nebulizers treatments and steroids.  Chest x-ray shows left lateral base opacity.  #2 hypertension continue ARB as he was taking at home.  #3 decreased appetite CT scan of the abdomen in the ER shows nothing acute.  When I saw him this morning she had a very good appetite and he finished even the last drop of orange juice that was in his cup and he said he enjoyed a breakfast and it was good.  #4 lung nodule needs outpatient follow-up  #5 multiple falls will get PT evaluation.  #6 mildly elevated troponin with no acute EKG changes or symptoms echocardiogram ordered by the admitting physician.  It is done the results are pending.   Estimated body mass index is 25.77 kg/m as calculated from the following:   Height as of this encounter: 6' (1.829 m).   Weight as of this encounter: 86.2 kg.  DVT prophylaxis: Lovenox Code Status: DNR no family Family Communication: None available Disposition Plan: Pending clinical improvement Consultants:   None Procedures: None Antimicrobials: Rocephin and azithromycin  Subjective: Resting in bed eating breakfast reports good appetite breathing seems to be slightly better than yesterday continues to be oxygen dependent he did not have oxygen prior to admission to the hospital.  Objective: Vitals:   08/06/18 2237 08/06/18 2249 08/07/18 0528 08/07/18 0759  BP:   121/66   Pulse:   81   Resp:   (!) 21   Temp:   98.4 F (  36.9 C)   TempSrc:   Oral   SpO2: 96% 96% 94% 97%  Weight:   86.2 kg   Height:        Intake/Output Summary (Last 24 hours) at 08/07/2018 1124 Last data filed at 08/07/2018 0537 Gross per 24 hour  Intake 939.53 ml  Output 450 ml  Net 489.53 ml   Filed Weights   08/06/18 1455 08/06/18 2052 08/07/18 0528  Weight: 83.9 kg 85.5 kg 86.2 kg    Examination:  General exam: Appears calm and comfortable  Respiratory system: Scattered  rhonchi in both lung fields more on the left than the right to auscultation. Respiratory effort normal. Cardiovascular system: S1 & S2 heard, RRR. No JVD, murmurs, rubs, gallops or clicks. No pedal edema. Gastrointestinal system: Abdomen is nondistended, soft and nontender. No organomegaly or masses felt. Normal bowel sounds heard. Central nervous system: Alert and oriented. No focal neurological deficits. Extremities: Trace bilateral pitting edema Skin: No rashes, lesions or ulcers Psychiatry: Judgement and insight appear normal. Mood & affect appropriate.     Data Reviewed: I have personally reviewed following labs and imaging studies  CBC: Recent Labs  Lab 08/06/18 1517 08/07/18 0353  WBC 9.7 6.4  NEUTROABS 7.0  --   HGB 13.5 12.1*  HCT 42.4 38.7*  MCV 97.0 94.6  PLT 258 725   Basic Metabolic Panel: Recent Labs  Lab 08/06/18 1517 08/06/18 2235 08/07/18 0353  NA 133*  --  135  K 4.4  --  4.3  CL 95*  --  102  CO2 28  --  25  GLUCOSE 125*  --  161*  BUN 29*  --  26*  CREATININE 1.17  --  1.20  CALCIUM 8.6*  --  8.2*  MG  --  2.1  --    GFR: Estimated Creatinine Clearance: 44.9 mL/min (by C-G formula based on SCr of 1.2 mg/dL). Liver Function Tests: Recent Labs  Lab 08/06/18 1517 08/07/18 0353  AST 24 21  ALT 36 29  ALKPHOS 89 70  BILITOT 1.0 0.5  PROT 6.5 5.4*  ALBUMIN 3.3* 2.7*   Recent Labs  Lab 08/06/18 1517  LIPASE 33   No results for input(s): AMMONIA in the last 168 hours. Coagulation Profile: No results for input(s): INR, PROTIME in the last 168 hours. Cardiac Enzymes: Recent Labs  Lab 08/06/18 1517 08/06/18 2235 08/07/18 0353 08/07/18 0953  TROPONINI 0.03* 0.05* 0.05* 0.04*   BNP (last 3 results) No results for input(s): PROBNP in the last 8760 hours. HbA1C: No results for input(s): HGBA1C in the last 72 hours. CBG: No results for input(s): GLUCAP in the last 168 hours. Lipid Profile: No results for input(s): CHOL, HDL, LDLCALC,  TRIG, CHOLHDL, LDLDIRECT in the last 72 hours. Thyroid Function Tests: Recent Labs    08/06/18 2235  TSH 0.601   Anemia Panel: No results for input(s): VITAMINB12, FOLATE, FERRITIN, TIBC, IRON, RETICCTPCT in the last 72 hours. Sepsis Labs: Recent Labs  Lab 08/06/18 1558  LATICACIDVEN 1.3    Recent Results (from the past 240 hour(s))  Blood Culture (routine x 2)     Status: None (Preliminary result)   Collection Time: 08/06/18  7:20 PM  Result Value Ref Range Status   Specimen Description BLOOD LEFT ANTECUBITAL  Final   Special Requests   Final    BOTTLES DRAWN AEROBIC AND ANAEROBIC Blood Culture adequate volume Performed at [facility] [address], [place], [place] [address]    Culture  Final    NO GROWTH < 12 HOURS Performed at [facility] [address] [place], [place] [address]    Report Status PENDING  Incomplete  Blood Culture (routine x 2)     Status: None (Preliminary result)   Collection Time: 08/06/18  7:25 PM  Result Value Ref Range Status   Specimen Description   Final    BLOOD RIGHT ANTECUBITAL Performed at [facility] [address] [place], [place] [address]    Special Requests   Final    BOTTLES DRAWN AEROBIC AND ANAEROBIC Blood Culture adequate volume Performed at [facility] [address] [place], [place] [address]    Culture   Final    NO GROWTH < 12 HOURS Performed at [facility] [address] [place], [place] [address]    Report Status PENDING  Incomplete  MRSA PCR Screening     Status: None   Collection Time: 08/06/18  7:47 PM  Result Value Ref Range Status   MRSA by PCR NEGATIVE NEGATIVE Final    Comment:        The GeneXpert MRSA Assay (FDA approved for NASAL specimens only), is one component of a comprehensive MRSA colonization surveillance program. It is not intended to diagnose MRSA infection nor to guide or monitor treatment for MRSA  infections. Performed at [facility], [place] [address] [place], [place] [address]   Culture, sputum-assessment     Status: None   Collection Time: 08/07/18  6:35 AM  Result Value Ref Range Status   Specimen Description SPUTUM  Final   Special Requests NONE  Final   Sputum evaluation   Final    THIS SPECIMEN IS ACCEPTABLE FOR SPUTUM CULTURE Performed at [facility], [employer] [address], [place], [place] [address]    Report Status 08/07/2018 FINAL  Final         Radiology Studies: Dg Chest 2 View  Result Date: 08/06/2018 CLINICAL DATA:  Productive cough for 2 days.  Shortness of breath. EXAM: CHEST - 2 VIEW COMPARISON:  None. FINDINGS: Cardiomegaly. Tortuous thoracic aorta. The hila and mediastinum otherwise unremarkable. Mild opacity in the lateral left lung base favored represent atelectasis or scar rather than infiltrate. No other acute abnormalities identified. IMPRESSION: Opacity in the lateral left lung base favored to represent atelectasis or scar rather than infiltrate. No other acute abnormalities. Electronically Signed   By: Dorise Bullion III M.D   On: 08/06/2018 15:36   Ct Angio Chest Pe W And/or Wo Contrast  Result Date: 08/06/2018 CLINICAL DATA:  Patient presented to ed with c/o frequent fall, diarrhea, possible UTI. Patient was seen at PCP on wed and had chest x-ray which was negative. [facility] lives in [facility] patient been vomiting and diarrhea in the past two week.*comment was truncated*^58mL ISOVUE-370 IOPAMIDOL (ISOVUE-370) INJECTION 76%PE suspected, high pretest prob EXAM: CT ANGIOGRAPHY CHEST WITH CONTRAST TECHNIQUE: Multidetector CT imaging of the chest was performed using the standard protocol during bolus administration of intravenous contrast. Multiplanar CT image reconstructions and MIPs were obtained to evaluate the vascular anatomy. CONTRAST:  61mL ISOVUE-370 IOPAMIDOL (ISOVUE-370) INJECTION 76% COMPARISON:  Radiograph  08/06/2018 FINDINGS: Cardiovascular: No filling defects within the pulmonary arteries to suggest acute pulmonary embolism. No acute findings of the aorta or great vessels. No pericardial fluid. Mediastinum/Nodes: No axillary or supraclavicular adenopathy. No mediastinal hilar adenopathy. No pericardial effusion. Esophagus normal. Lungs/Pleura: No pulmonary infarction.  No pneumonia. LEFT upper lobe nodule  measuring 9 mm by 9 mm (image 58/10 has irregular margin a somewhat branching pattern (image 75/8). There is atelectasis in the medial aspect of the RIGHT lower lobe (image 77/10) Upper Abdomen: There is gas within the wall a loop of bowel anterior to the liver (image 89/4) favored ascending colon. Potential small focus of gas within the portal system of the liver (image 86 and 88 of series 4) within the RIGHT hepatic lobe. These findings are concerning for pneumatosis intestinalis and portal venous gas. Musculoskeletal: No aggressive osseous lesion. Review of the MIP images confirms the above findings. IMPRESSION: 1. Pneumatosis intestinalis of the ascending colon and potential portal venous gas. Recommend CT of the abdomen pelvis to evaluate for ischemia versus benign pneumatosis intestinalis. 2. No evidence of acute pulmonary embolism. 3. Small focus consolidation in the medial RIGHT lower lobe is favored atelectasis. Can not exclude a small focus of infection. 4. Irregular nodule in the LEFT upper lobe with differential including small focus infection versus neoplasm. Favor infection. Recommend follow-up CT thorax in 4-6 weeks to re-evaluate. Findings conveyed toJOSHUA LONG on 08/06/2018  at18:01. Electronically Signed   By: Suzy Bouchard M.D.   On: 08/06/2018 18:01   Ct Abdomen Pelvis W Contrast  Result Date: 08/06/2018 CLINICAL DATA:  Diarrhea and possible UTI. EXAM: CT ABDOMEN AND PELVIS WITH CONTRAST TECHNIQUE: Multidetector CT imaging of the abdomen and pelvis was performed using the standard  protocol following bolus administration of intravenous contrast. CONTRAST:  4mL ISOVUE-300 IOPAMIDOL (ISOVUE-300) INJECTION 61% COMPARISON:  None. FINDINGS: Lower chest: Mild peripheral scar of the lung bases are noted. The heart size is enlarged. Hepatobiliary: No focal lesions identified in the liver. The gallbladder is decompressed limiting evaluation. No biliary ductal dilatation is identified. Pancreas: Unremarkable. No pancreatic ductal dilatation or surrounding inflammatory changes. Spleen: Normal in size without focal abnormality. Adrenals/Urinary Tract: The bilateral adrenal glands are normal. Simple cysts are noted in both kidneys. Bilateral parapelvic renal cysts are identified. There is no hydronephrosis bilaterally. The bladder is normal. Stomach/Bowel: Stomach is within normal limits. The appendix is not seen. No inflammatory changes noted around the cecum. No evidence of bowel wall thickening, distention, or inflammatory changes. Vascular/Lymphatic: Atherosclerosis of the aorta is identified. Mild ectasia of distal abdominal aorta is noted. There is no abdominal lymphadenopathy. Reproductive: Prostate is unremarkable. Other: None. Musculoskeletal: Degenerative joint changes of the spine are identified. Chronic compression deformity of L5 is noted. IMPRESSION: No bowel obstruction or diverticulitis identified. Bilateral kidney cysts.  No hydronephrosis bilaterally. No acute abnormality identified in the abdomen and pelvis. Electronically Signed   By: Abelardo Diesel M.D.   On: 08/06/2018 19:14        Scheduled Meds: . aspirin EC  81 mg Oral Daily  . budesonide (PULMICORT) nebulizer solution  0.25 mg Nebulization BID  . chlorhexidine  15 mL Mouth Rinse BID  . dextromethorphan-guaiFENesin  1 tablet Oral BID  . enoxaparin (LOVENOX) injection  40 mg Subcutaneous Q24H  . feeding supplement (ENSURE ENLIVE)  237 mL Oral BID BM  . hydrogen peroxide      . ipratropium-albuterol  3 mL Nebulization  Q6H  . losartan  100 mg Oral Daily  . mouth rinse  15 mL Mouth Rinse q12n4p  . methylPREDNISolone (SOLU-MEDROL) injection  40 mg Intravenous Q12H  . multivitamin with minerals  1 tablet Oral Daily  . pantoprazole  40 mg Oral Daily   Continuous Infusions: . azithromycin Stopped (08/06/18 2134)  . cefTRIAXone (ROCEPHIN)  IV Stopped (  08/06/18 2008)     LOS: 1 day        Georgette Shell, MD [facility]   If 7PM-7AM, please contact night-coverage [email or web address] Password [facility] 08/07/2018, 11:24 AM

## 2018-08-07 NOTE — Clinical Social Work Note (Signed)
Clinical Social Work Assessment  Patient Details  Name: William Skinner MRN: [id number] Date of Birth: [birth year]  Date of referral:  08/07/18               Reason for consult:  Facility Placement, Discharge Planning                Permission sought to share information with:  Family Supports Permission granted to share information::  Yes, Verbal Permission Granted  Name::     [place]::     Relationship::  daughter  Contact Information:  [phone]  Housing/Transportation Living arrangements for the past 2 months:  Assisted Living Facility([facility]) Source of Information:  Patient, Adult Children Patient Interpreter Needed:  None Criminal Activity/Legal Involvement Pertinent to Current Situation/Hospitalization:  No - Comment as needed Significant Relationships:  Adult Children Lives with:  Facility Resident Do you feel safe going back to the place where you live?  Yes Need for family participation in patient care:  Yes (Comment)  Care giving concerns:  Patient is from [facility]. Patient had daughter in law and son at bedside during assessment.   Social Worker assessment / plan:  Patient stated he would be agreeable to go to rehab if physical therapy recommends it. patients daughter in law stated that patient has had several falls at [facility] and does not feel that patient going back there at this time would be an appropriate venue. CSW went over the promise of SNF placement and family stated they understood. CSW will continue to follow for discharge plans  Employment status:  Retired Forensic scientist:  Medicare PT Recommendations:  [place] / Referral to community resources:  [place]  Patient/Family's Response to care:  Patient and family appreciative of CSW role in care  Patient/Family's Understanding of and Emotional Response to Diagnosis, Current Treatment, and Prognosis:  CSW will follow up  with family once bed offers are available   Emotional Assessment Appearance:  Appears stated age Attitude/Demeanor/Rapport:  Engaged Affect (typically observed):  Accepting Orientation:  Oriented to Self, Oriented to Place, Oriented to  Time, Oriented to Situation Alcohol / Substance use:  Not Applicable Psych involvement (Current and /or in the community):  No (Comment)  Discharge Needs  Concerns to be addressed:  Care Coordination Readmission within the last 30 days:  No Current discharge risk:  Dependent with Mobility Barriers to Discharge:  Continued Medical Work up   [employer], LCSW 08/07/2018, 4:30 PM

## 2018-08-07 NOTE — Progress Notes (Signed)
Initial Nutrition Assessment  DOCUMENTATION CODES:   Not applicable  INTERVENTION:  - Continue Ensure Enlive BID, each supplement provides 350 kcal and 20 grams of protein. - Continue to encourage PO intakes.    NUTRITION DIAGNOSIS:   Inadequate oral intake related to acute illness, decreased appetite, nausea as evidenced by per patient/family report.  GOAL:   Patient will meet greater than or equal to 90% of their needs  MONITOR:   PO intake, Supplement acceptance, Weight trends, Labs, I & O's  REASON FOR ASSESSMENT:   Malnutrition Screening Tool  ASSESSMENT:   85 y.o. male with history of HTN, carotid endarterectomy, and possible dementia. He presented to the ED via EMS with persistent SOB with hypoxia and wheezing for 2 weeks. Patient has been having frequent falls recently; no reports of any head injury or loss of consciousness. Patient reported poor appetite for the past few months.  BMI indicates overweight status, appropriate for advanced age. No intakes documented since admission. Patient was eating lunch at the time of RD visit and had completed 75% prior to RD entering the room and planned to continue eating after RD visit.  Patient denies abdominal pain or nausea since admission and states that since admission his appetite has improved. Patient reports that for ~1 month PTA he has been feeling nauseated after eating only a few bites of a meal and would often need to spit foods back out. He has not had this happen since admission.  He denies any chewing issues at baseline but did feel it was hard to chew items during the past month d/t them making him nauseous. He denies any issues with swallowing at baseline, over the past month, or now.  Patient reports that he lives at [facility] and that the food there "is not good". He has a microwave in his room/apartment but opts to go to the dining room for meals, despite his displeasure with the food, rather than preparing items on  his own.   Patient reports UBW of 200 lb and feels that he last weighed this ~1 month ago. Current weight is 190 lb. Per chart review, weight on 02/11/18 was 195 lb. Based on patient's report, he has lost 10 lb (5% body weight) in the past 1 month. Will monitor weight trends closely.    Medications reviewed; 40 mg solu-medrol BID, 40 mg oral protonix/day. Labs reviewed; BUN: 26 mg/dl, Ca: 8.2 mg/dl, GFR: 53 ml/min.       NUTRITION - FOCUSED PHYSICAL EXAM:    Most Recent Value  Orbital Region  No depletion  Upper Arm Region  No depletion  Thoracic and Lumbar Region  Unable to assess  Buccal Region  No depletion  Temple Region  No depletion  Clavicle Bone Region  No depletion  Clavicle and Acromion Bone Region  No depletion  Scapular Bone Region  Unable to assess  Dorsal Hand  No depletion  Patellar Region  No depletion  Anterior Thigh Region  Unable to assess  Posterior Calf Region  Mild depletion  Edema (RD Assessment)  None  Hair  Reviewed  Eyes  Reviewed  Mouth  Reviewed  Skin  Reviewed  Nails  Reviewed       Diet Order:   Diet Order            Diet Heart Room service appropriate? Yes; Fluid consistency: Thin  Diet effective now              EDUCATION NEEDS:   No  education needs have been identified at this time  Skin:  Skin Assessment: Reviewed RN Assessment  Last BM:  1/18  Height:   Ht Readings from Last 1 Encounters:  08/06/18 6' (1.829 m)    Weight:   Wt Readings from Last 1 Encounters:  08/07/18 86.2 kg    Ideal Body Weight:  80.91 kg  BMI:  Body mass index is 25.77 kg/m.  Estimated Nutritional Needs:   Kcal:  1650-1850 kcal  Protein:  65-75 grams  Fluid:  >/= 1.8 L/day     Jarome Matin, MS, RD, LDN, [facility] Inpatient Clinical Dietitian Pager # [phone] After hours/weekend pager # [phone]

## 2018-08-07 NOTE — Evaluation (Signed)
Physical Therapy Evaluation Patient Details Name: William Skinner MRN: [id number] DOB: [birth year] Today's Date: 08/07/2018   History of Present Illness  85 y.o. male with history of hypertension, carotid endarterectomy possible dementia was admitted through  the ER with  short of breath/ hypoxia; VQ scan negative for PT; dx withARF, pna  and recent falls  Clinical Impression  Pt admitted with above diagnosis. Pt currently with functional limitations due to the deficits listed below (see PT Problem List).  Pt reluctant to move at all, agreeable only with encouragement, agrees to sitting EOB with PT; pt is much weakerthan his baseline with multipel recent falls at ALF; pt and family are interested in SNF; PT recommending SNF at this time, will  Follow in acute setting;  Pt will benefit from skilled PT to increase their independence and safety with mobility to allow discharge to the venue listed below.     Follow Up Recommendations SNF    Equipment Recommendations  None recommended by PT    Recommendations for Other Services       Precautions / Restrictions Precautions Precautions: Fall      Mobility  Bed Mobility Overal bed mobility: Needs Assistance Bed Mobility: Supine to Sit;Sit to Supine     Supine to sit: Min assist;Mod assist Sit to supine: Min assist;Mod assist   General bed mobility comments: min assist to elevate trunk, assist to bring LEs onto bed  Transfers                 General transfer comment: NT with +1 assist and multiple recent falls from EOB; lateral scooting along EOB with min assist and incr time  Ambulation/Gait                Stairs            Wheelchair Mobility    Modified Rankin (Stroke Patients Only)       Balance Overall balance assessment: History of Falls(multiple recent falls (at least 2-3 in last 2wks), has fallen from EOB) Sitting-balance support: No upper extremity supported;Feet supported Sitting balance-Leahy  Scale: Fair                                       Pertinent Vitals/Pain Pain Assessment: No/denies pain    Home Living Family/patient expects to be discharged to:: Assisted living               Home Equipment: Wheelchair - manual Additional Comments: resides at [facility]; pt going to PT at [facility]    Prior Function Level of Independence: Needs assistance   Gait / Transfers Assistance Needed: non-amb PTA; assist to transfer; uses LEs to propel w/c;            Hand Dominance        Extremity/Trunk Assessment   Upper Extremity Assessment Upper Extremity Assessment: Generalized weakness    Lower Extremity Assessment Lower Extremity Assessment: RLE deficits/detail RLE Deficits / Details: bil LEs grossly 3+ to 4/5,  AROM WFL, limited knee extension bil d/t tight hamstrings  RLE: Unable to fully assess due to pain    Cervical / Trunk Assessment Cervical / Trunk Assessment: Other exceptions;Kyphotic Cervical / Trunk Exceptions: posterior pevic tilt  Communication   Communication: No difficulties  Cognition Arousal/Alertness: Awake/alert Behavior During Therapy: WFL for tasks assessed/performed Overall Cognitive Status: Within Functional Limits for tasks assessed  General Comments      Exercises     Assessment/Plan    PT Assessment Patient needs continued PT services  PT Problem List Decreased strength;Decreased mobility;Decreased activity tolerance;Decreased balance;Decreased knowledge of use of DME       PT Treatment Interventions DME instruction;Functional mobility training;Patient/family education;Gait training;Therapeutic activities;Therapeutic exercise    PT Goals (Current goals can be found in the Care Plan section)  Acute Rehab PT Goals Patient Stated Goal: to be able to stand, may be take a few steps PT Goal Formulation: With patient Time For Goal Achievement:  08/21/18 Potential to Achieve Goals: Good    Frequency Min 2X/week   Barriers to discharge        Co-evaluation               AM-PAC PT "6 Clicks" Mobility  Outcome Measure Help needed turning from your back to your side while in a flat bed without using bedrails?: A Lot Help needed moving from lying on your back to sitting on the side of a flat bed without using bedrails?: A Lot Help needed moving to and from a bed to a chair (including a wheelchair)?: A Lot Help needed standing up from a chair using your arms (e.g., wheelchair or bedside chair)?: A Lot Help needed to walk in hospital room?: Total Help needed climbing 3-5 steps with a railing? : Total 6 Click Score: 10    End of Session Equipment Utilized During Treatment: Gait belt Activity Tolerance: Patient limited by fatigue Patient left: in bed;with call bell/phone within reach;with bed alarm set;with family/visitor present   PT Visit Diagnosis: Muscle weakness (generalized) (M62.81);Repeated falls (R29.6)    Time: 2263-3354 PT Time Calculation (min) (ACUTE ONLY): 31 min   Charges:   PT Evaluation $PT Eval Low Complexity: 1 Low PT Treatments $Therapeutic Activity: 8-22 mins        Kenyon Ana, PT  Pager: [phone] Acute Rehab Dept [facility]): [phone]   08/07/2018   [facility] 08/07/2018, 4:12 PM

## 2018-08-07 NOTE — Progress Notes (Signed)
  Echocardiogram 2D Echocardiogram has been performed.  William Skinner M 08/07/2018, 9:33 AM

## 2018-08-08 LAB — COMPREHENSIVE METABOLIC PANEL
ALT: 29 U/L (ref 0–44)
AST: 27 U/L (ref 15–41)
Albumin: 3 g/dL — ABNORMAL LOW (ref 3.5–5.0)
Alkaline Phosphatase: 68 U/L (ref 38–126)
Anion gap: 9 (ref 5–15)
BUN: 31 mg/dL — ABNORMAL HIGH (ref 8–23)
CO2: 23 mmol/L (ref 22–32)
Calcium: 8.6 mg/dL — ABNORMAL LOW (ref 8.9–10.3)
Chloride: 104 mmol/L (ref 98–111)
Creatinine, Ser: 1.11 mg/dL (ref 0.61–1.24)
GFR calc Af Amer: >60 mL/min (ref >60)
GFR calc non Af Amer: 58 mL/min — ABNORMAL LOW (ref >60)
Glucose, Bld: 179 mg/dL — ABNORMAL HIGH (ref 70–99)
POTASSIUM: 4 mmol/L (ref 3.5–5.1)
SODIUM: 136 mmol/L (ref 135–145)
Total Bilirubin: 0.6 mg/dL (ref 0.3–1.2)
Total Protein: 5.9 g/dL — ABNORMAL LOW (ref 6.5–8.1)

## 2018-08-08 LAB — LEGIONELLA PNEUMOPHILA SEROGP 1 UR AG: L. PNEUMOPHILA SEROGP 1 UR AG: NEGATIVE

## 2018-08-08 LAB — CBC
HCT: 39.8 % (ref 39.0–52.0)
Hemoglobin: 12.1 g/dL — ABNORMAL LOW (ref 13.0–17.0)
MCH: 30.1 pg (ref 26.0–34.0)
MCHC: 30.4 g/dL (ref 30.0–36.0)
MCV: 99 fL (ref 80.0–100.0)
PLATELETS: 272 10*3/uL (ref 150–400)
RBC: 4.02 MIL/uL — ABNORMAL LOW (ref 4.22–5.81)
RDW: 12.8 % (ref 11.5–15.5)
WBC: 11.1 10*3/uL — ABNORMAL HIGH (ref 4.0–10.5)
nRBC: 0 % (ref 0.0–0.2)

## 2018-08-08 MED ORDER — IPRATROPIUM-ALBUTEROL 0.5-2.5 (3) MG/3ML IN SOLN
3.0000 mL | Freq: Three times a day (TID) | RESPIRATORY_TRACT | Status: DC
  Administered 2018-08-08 – 2018-08-09 (×3): 3 mL via RESPIRATORY_TRACT
  Filled 2018-08-08 (×3): qty 3

## 2018-08-08 NOTE — Progress Notes (Signed)
PROGRESS NOTE    William Skinner  ZOX:[id number] DOB: 17-Nov-1925 DOA: 08/06/2018 PCP: Lawerance Cruel, MD    Brief Narrative: 85 y.o.malewithhistory of hypertension, carotid endarterectomy possible dementia was brought to the ER patient was persistently short of breath with hypoxia for the last 2 weeks. As per the report from the ER physician patient was placed on nebulizer treatment last 3 days and Lasix was eventually added for lower extremity edema. Despite this patient was still short of breath hypoxic with wheezing. EMS was called and patient has been placed on 3 L oxygen and brought to the ER. Patient has been also having frequent falls recently and has had a skin abrasion of the lower extremities. There was no reports of any head injury or loss of consciousness. Patient states he also has been having poor appetite over the last few months.  ED Course:In the ER patient was hypoxic and wheezing bilaterally with CT angiogram of the chest done showing negative for pulmonary embolism with possibility of right middle lobe pneumonia. Since there was some concern for pneumocystis coli dedicated CAT scan was ordered for the abdomen and pelvis which was negative for anything acute. Troponin mildly elevated BNP 122 WBC count was 9.7. Patient was placed on nebulizer treatment antibiotics and admitted for acute respiratory failure likely from bronchitis and possible pneumonia. Abdominal exam appears benign.   08/08/2018 patient seen and examined in the room.  His daughter-in-law was in the room.  His daughter-in-law is a Marine scientist in [place] [employer].  She updated me that patient lives at an assisted living facility and he is seen by MD is making house calls.  He has been having multiple falls at the assisted living facility.  They are hoping that patient can go to a rehab from the hospital.  Patient has been seen by physical therapy.  Social worker consulted for SNF. Assessment & Plan:   Principal  Problem:   Acute respiratory failure (HCC) Active Problems:   Essential hypertension   Generalized weakness   Diarrhea of presumed infectious origin   Community acquired pneumonia   #1 acute hypoxic respiratory failure secondary to pneumonia bronchitis.  Patient was placed on 2 L of oxygen and weaned off oxygen today saturation of around 9092% on room air.  Patient is not ambulatory at baseline.  However he transfers he stands up.  He was not on oxygen prior to coming to the hospital.  PT evaluated the patient recommended SNF.  Social worker consulted for placement.  Continue antibiotics and nebulizers at this time.  Chest x-ray showed left lateral base opacity.   Continue IV Solu-Medrol 40 mg twice daily.  If he stable try to taper to p.o. steroids tomorrow.  I will continue IV steroids for today with diffuse wheezing.  #2 hypertension continue ARB as he was taking at home.  #3 decreased appetite CT scan of the abdomen in the ER shows nothing acute.  When I saw him this morning she had a very good appetite and he finished even the last drop of orange juice that was in his cup and he said he enjoyed a breakfast and it was good.  #4 lung nodule needs outpatient follow-up  #5 multiple falls and deconditioning PT recommended SNF.  #6 mildly elevated troponin with no acute EKG changes or symptoms echocardiogram shows ejection fraction 55 to 60% normal LV size with mild LV hypertrophy normal RV size and systolic function.     Nutrition Problem: Inadequate oral intake Etiology: acute  illness, decreased appetite, nausea     Signs/Symptoms: per patient/family report    Interventions: Ensure Enlive (each supplement provides 350kcal and 20 grams of protein)  Estimated body mass index is 25.63 kg/m as calculated from the following:   Height as of this encounter: 6' (1.829 m).   Weight as of this encounter: 85.7 kg.  DVT prophylaxis: Lovenox Code Status: Full code Family  Communication: Discussed with daughter-in-law Disposition Plan: If he remains stable over the next 24 hours patient can be discharged to a skilled nursing facility tomorrow 08/09/2018.  Consultants:  None  Procedures: None Antimicrobials: Rocephin and azithromycin  Subjective: Resting in bed awake alert still wheezing complains of a cough and shortness of breath  Objective: Vitals:   08/08/18 0255 08/08/18 0540 08/08/18 0815 08/08/18 0846  BP:  115/62 123/72   Pulse:  84 74 75  Resp:  18 17 17   Temp:  98 F (36.7 C) 98.4 F (36.9 C)   TempSrc:   Oral   SpO2: 92% 92% 94% 95%  Weight:  85.7 kg    Height:        Intake/Output Summary (Last 24 hours) at 08/08/2018 1100 Last data filed at 08/08/2018 7544 Gross per 24 hour  Intake 410.81 ml  Output 850 ml  Net -439.19 ml   Filed Weights   08/06/18 2052 08/07/18 0528 08/08/18 0540  Weight: 85.5 kg 86.2 kg 85.7 kg    Examination:  General exam: Appears calm and comfortable  Respiratory system: Diffuse wheezing bilaterally to auscultation. Respiratory effort normal. Cardiovascular system: S1 & S2 heard, RRR. No JVD, murmurs, rubs, gallops or clicks. No pedal edema. Gastrointestinal system: Abdomen is nondistended, soft and nontender. No organomegaly or masses felt. Normal bowel sounds heard. Central nervous system: Alert and oriented. No focal neurological deficits. Extremities: Trace edema few abrasions on both knees Skin: No rashes, lesions or ulcers Psychiatry: Judgement and insight appear normal. Mood & affect appropriate.     Data Reviewed: I have personally reviewed following labs and imaging studies  CBC: Recent Labs  Lab 08/06/18 1517 08/07/18 0353  WBC 9.7 6.4  NEUTROABS 7.0  --   HGB 13.5 12.1*  HCT 42.4 38.7*  MCV 97.0 94.6  PLT 258 920   Basic Metabolic Panel: Recent Labs  Lab 08/06/18 1517 08/06/18 2235 08/07/18 0353  NA 133*  --  135  K 4.4  --  4.3  CL 95*  --  102  CO2 28  --  25    GLUCOSE 125*  --  161*  BUN 29*  --  26*  CREATININE 1.17  --  1.20  CALCIUM 8.6*  --  8.2*  MG  --  2.1  --    GFR: Estimated Creatinine Clearance: 44.9 mL/min (by C-G formula based on SCr of 1.2 mg/dL). Liver Function Tests: Recent Labs  Lab 08/06/18 1517 08/07/18 0353  AST 24 21  ALT 36 29  ALKPHOS 89 70  BILITOT 1.0 0.5  PROT 6.5 5.4*  ALBUMIN 3.3* 2.7*   Recent Labs  Lab 08/06/18 1517  LIPASE 33   No results for input(s): AMMONIA in the last 168 hours. Coagulation Profile: No results for input(s): INR, PROTIME in the last 168 hours. Cardiac Enzymes: Recent Labs  Lab 08/06/18 1517 08/06/18 2235 08/07/18 0353 08/07/18 0953  TROPONINI 0.03* 0.05* 0.05* 0.04*   BNP (last 3 results) No results for input(s): PROBNP in the last 8760 hours. HbA1C: No results for input(s): HGBA1C in the last  72 hours. CBG: No results for input(s): GLUCAP in the last 168 hours. Lipid Profile: No results for input(s): CHOL, HDL, LDLCALC, TRIG, CHOLHDL, LDLDIRECT in the last 72 hours. Thyroid Function Tests: Recent Labs    08/06/18 2235  TSH 0.601   Anemia Panel: No results for input(s): VITAMINB12, FOLATE, FERRITIN, TIBC, IRON, RETICCTPCT in the last 72 hours. Sepsis Labs: Recent Labs  Lab 08/06/18 1558  LATICACIDVEN 1.3    Recent Results (from the past 240 hour(s))  Urine culture     Status: Abnormal   Collection Time: 08/06/18  3:18 PM  Result Value Ref Range Status   Specimen Description URINE, CLEAN CATCH  Final   Special Requests   Final    Normal Performed at [facility] [address], [place], [place] [address]    Culture <10,000 COLONIES/mL INSIGNIFICANT GROWTH (A)  Final   Report Status 08/07/2018 FINAL  Final  Respiratory Panel by PCR     Status: None   Collection Time: 08/06/18  4:09 PM  Result Value Ref Range Status   Adenovirus NOT DETECTED NOT DETECTED Final   Coronavirus 229E NOT DETECTED NOT DETECTED Final   Coronavirus HKU1  NOT DETECTED NOT DETECTED Final   Coronavirus NL63 NOT DETECTED NOT DETECTED Final   Coronavirus OC43 NOT DETECTED NOT DETECTED Final   Metapneumovirus NOT DETECTED NOT DETECTED Final   Rhinovirus / Enterovirus NOT DETECTED NOT DETECTED Final   Influenza A NOT DETECTED NOT DETECTED Final   Influenza B NOT DETECTED NOT DETECTED Final   Parainfluenza Virus 1 NOT DETECTED NOT DETECTED Final   Parainfluenza Virus 2 NOT DETECTED NOT DETECTED Final   Parainfluenza Virus 3 NOT DETECTED NOT DETECTED Final   Parainfluenza Virus 4 NOT DETECTED NOT DETECTED Final   Respiratory Syncytial Virus NOT DETECTED NOT DETECTED Final   Bordetella pertussis NOT DETECTED NOT DETECTED Final   Chlamydophila pneumoniae NOT DETECTED NOT DETECTED Final   Mycoplasma pneumoniae NOT DETECTED NOT DETECTED Final    Comment: Performed at [facility], [place] [address] [place], [place] [address]  Blood Culture (routine x 2)     Status: None (Preliminary result)   Collection Time: 08/06/18  7:20 PM  Result Value Ref Range Status   Specimen Description BLOOD LEFT ANTECUBITAL  Final   Special Requests   Final    BOTTLES DRAWN AEROBIC AND ANAEROBIC Blood Culture adequate volume Performed at [facility] [address], [place], [place] [address]    Culture   Final    NO GROWTH < 24 HOURS Performed at [facility] [address] [place], [place] [address]    Report Status PENDING  Incomplete  Blood Culture (routine x 2)     Status: None (Preliminary result)   Collection Time: 08/06/18  7:25 PM  Result Value Ref Range Status   Specimen Description   Final    BLOOD RIGHT ANTECUBITAL Performed at [facility] [address], [place], [place] [address]    Special Requests   Final    BOTTLES DRAWN AEROBIC AND ANAEROBIC Blood Culture adequate volume Performed at [facility] [address] [place], [place] [address]    Culture   Final    NO GROWTH  < 24 HOURS Performed at [facility] [address], [place],  [address]    Report Status PENDING  Incomplete  MRSA PCR Screening     Status: None   Collection  Time: 08/06/18  7:47 PM  Result Value Ref Range Status   MRSA by PCR NEGATIVE NEGATIVE Final    Comment:        The GeneXpert MRSA Assay (FDA approved for NASAL specimens only), is one component of a comprehensive MRSA colonization surveillance program. It is not intended to diagnose MRSA infection nor to guide or monitor treatment for MRSA infections. Performed at [facility], [place] [address], [place], [place] [address]   Culture, sputum-assessment     Status: None   Collection Time: 08/07/18  6:35 AM  Result Value Ref Range Status   Specimen Description SPUTUM  Final   Special Requests NONE  Final   Sputum evaluation   Final    THIS SPECIMEN IS ACCEPTABLE FOR SPUTUM CULTURE Performed at [facility], [place] [address], [place], [place] [address]    Report Status 08/07/2018 FINAL  Final  Culture, respiratory     Status: None (Preliminary result)   Collection Time: 08/07/18  6:35 AM  Result Value Ref Range Status   Specimen Description   Final    SPUTUM Performed at [facility] [address] [place], [place] [address]    Special Requests   Final    NONE Reflexed from [phone] Performed at [facility] [address] [place], [place] [address]    Gram Stain   Final    FEW WBC PRESENT, PREDOMINANTLY PMN RARE GRAM POSITIVE COCCI IN PAIRS FEW GRAM POSITIVE RODS FEW GRAM NEGATIVE RODS FEW SQUAMOUS EPITHELIAL CELLS PRESENT Performed at [facility], [place] [address] [place],  [address]    Culture FEW Consistent with normal respiratory flora.  Final   Report Status PENDING  Incomplete         Radiology Studies: Dg Chest 2 View  Result Date: 08/06/2018 CLINICAL DATA:  Productive cough for 2 days.  Shortness  of breath. EXAM: CHEST - 2 VIEW COMPARISON:  None. FINDINGS: Cardiomegaly. Tortuous thoracic aorta. The hila and mediastinum otherwise unremarkable. Mild opacity in the lateral left lung base favored represent atelectasis or scar rather than infiltrate. No other acute abnormalities identified. IMPRESSION: Opacity in the lateral left lung base favored to represent atelectasis or scar rather than infiltrate. No other acute abnormalities. Electronically Signed   By: Dorise Bullion III M.D   On: 08/06/2018 15:36   Ct Angio Chest Pe W And/or Wo Contrast  Result Date: 08/06/2018 CLINICAL DATA:  Patient presented to ed with c/o frequent fall, diarrhea, possible UTI. Patient was seen at PCP on wed and had chest x-ray which was negative. [facility] been vomiting and diarrhea in the past two week.*comment was truncated*^39mL ISOVUE-370 IOPAMIDOL (ISOVUE-370) INJECTION 76%PE suspected, high pretest prob EXAM: CT ANGIOGRAPHY CHEST WITH CONTRAST TECHNIQUE: Multidetector CT imaging of the chest was performed using the standard protocol during bolus administration of intravenous contrast. Multiplanar CT image reconstructions and MIPs were obtained to evaluate the vascular anatomy. CONTRAST:  53mL ISOVUE-370 IOPAMIDOL (ISOVUE-370) INJECTION 76% COMPARISON:  Radiograph 08/06/2018 FINDINGS: Cardiovascular: No filling defects within the pulmonary arteries to suggest acute pulmonary embolism. No acute findings of the aorta or great vessels. No pericardial fluid. Mediastinum/Nodes: No axillary or supraclavicular adenopathy. No mediastinal hilar adenopathy. No pericardial effusion. Esophagus normal. Lungs/Pleura: No pulmonary infarction.  No pneumonia. LEFT upper lobe nodule measuring 9 mm by 9 mm (image 58/10 has irregular margin a somewhat branching pattern (image 75/8). There is atelectasis in the medial  aspect of the RIGHT lower lobe (image 77/10) Upper Abdomen: There is gas within the wall a loop of  bowel anterior to the liver (image 89/4) favored ascending colon. Potential small focus of gas within the portal system of the liver (image 86 and 88 of series 4) within the RIGHT hepatic lobe. These findings are concerning for pneumatosis intestinalis and portal venous gas. Musculoskeletal: No aggressive osseous lesion. Review of the MIP images confirms the above findings. IMPRESSION: 1. Pneumatosis intestinalis of the ascending colon and potential portal venous gas. Recommend CT of the abdomen pelvis to evaluate for ischemia versus benign pneumatosis intestinalis. 2. No evidence of acute pulmonary embolism. 3. Small focus consolidation in the medial RIGHT lower lobe is favored atelectasis. Can not exclude a small focus of infection. 4. Irregular nodule in the LEFT upper lobe with differential including small focus infection versus neoplasm. Favor infection. Recommend follow-up CT thorax in 4-6 weeks to re-evaluate. Findings conveyed toJOSHUA LONG on 08/06/2018  at18:01. Electronically Signed   By: Suzy Bouchard M.D.   On: 08/06/2018 18:01   Ct Abdomen Pelvis W Contrast  Result Date: 08/06/2018 CLINICAL DATA:  Diarrhea and possible UTI. EXAM: CT ABDOMEN AND PELVIS WITH CONTRAST TECHNIQUE: Multidetector CT imaging of the abdomen and pelvis was performed using the standard protocol following bolus administration of intravenous contrast. CONTRAST:  74mL ISOVUE-300 IOPAMIDOL (ISOVUE-300) INJECTION 61% COMPARISON:  None. FINDINGS: Lower chest: Mild peripheral scar of the lung bases are noted. The heart size is enlarged. Hepatobiliary: No focal lesions identified in the liver. The gallbladder is decompressed limiting evaluation. No biliary ductal dilatation is identified. Pancreas: Unremarkable. No pancreatic ductal dilatation or surrounding inflammatory changes. Spleen: Normal in size without focal abnormality. Adrenals/Urinary Tract: The bilateral adrenal glands are normal. Simple cysts are noted in both kidneys.  Bilateral parapelvic renal cysts are identified. There is no hydronephrosis bilaterally. The bladder is normal. Stomach/Bowel: Stomach is within normal limits. The appendix is not seen. No inflammatory changes noted around the cecum. No evidence of bowel wall thickening, distention, or inflammatory changes. Vascular/Lymphatic: Atherosclerosis of the aorta is identified. Mild ectasia of distal abdominal aorta is noted. There is no abdominal lymphadenopathy. Reproductive: Prostate is unremarkable. Other: None. Musculoskeletal: Degenerative joint changes of the spine are identified. Chronic compression deformity of L5 is noted. IMPRESSION: No bowel obstruction or diverticulitis identified. Bilateral kidney cysts.  No hydronephrosis bilaterally. No acute abnormality identified in the abdomen and pelvis. Electronically Signed   By: Abelardo Diesel M.D.   On: 08/06/2018 19:14        Scheduled Meds: . aspirin EC  81 mg Oral Daily  . budesonide (PULMICORT) nebulizer solution  0.25 mg Nebulization BID  . chlorhexidine  15 mL Mouth Rinse BID  . dextromethorphan-guaiFENesin  1 tablet Oral BID  . enoxaparin (LOVENOX) injection  40 mg Subcutaneous Q24H  . feeding supplement (ENSURE ENLIVE)  237 mL Oral BID BM  . ipratropium-albuterol  3 mL Nebulization TID  . losartan  100 mg Oral Daily  . mouth rinse  15 mL Mouth Rinse q12n4p  . methylPREDNISolone (SOLU-MEDROL) injection  40 mg Intravenous Q12H  . multivitamin with minerals  1 tablet Oral Daily  . pantoprazole  40 mg Oral Daily   Continuous Infusions: . azithromycin 500 mg (08/07/18 2013)  . cefTRIAXone (ROCEPHIN)  IV 2 g (08/07/18 2005)     LOS: 2 days     Georgette Shell, MD [facility]  If 7PM-7AM, please contact night-coverage [email or web address] Password [facility] 08/08/2018,  11:00 AM

## 2018-08-08 NOTE — NC FL2 (Signed)
[place] LEVEL OF CARE SCREENING TOOL     IDENTIFICATION  Patient Name: William Skinner Birthdate: 24-Dec-1925 Sex: male Admission Date (Current Location): 08/06/2018  [facility] and [place] Number:  Herbalist and Address:  [facility],  [place], [place]      Provider Number: [id number]  Attending Physician Name and Address:  Georgette Shell, MD  Relative Name and Phone Number:  Brevyn Ring, [phone]    Current Level of Care: Hospital Recommended Level of Care: [place] Prior Approval Number:    Date Approved/Denied:   PASRR Number: [id number]  Discharge Plan: SNF    Current Diagnoses: Patient Active Problem List   Diagnosis Date Noted  . Acute respiratory failure ([place]) 08/06/2018  . Generalized weakness 08/06/2018  . Diarrhea of presumed infectious origin 08/06/2018  . Community acquired pneumonia 08/06/2018  . H/O carotid endarterectomy 06/01/2016  . Primary osteoarthritis of right knee 05/31/2016  . Essential hypertension 05/31/2016  . H/O spinal stenosis 05/31/2016  . Pain in joint, lower leg 03/06/2014    Orientation RESPIRATION BLADDER Height & Weight     Self, Time, Situation, Place  O2(2L) External catheter, Incontinent Weight: 189 lb (85.7 kg) Height:  6' (182.9 cm)  BEHAVIORAL SYMPTOMS/MOOD NEUROLOGICAL BOWEL NUTRITION STATUS      Continent Diet(heart healthy)  AMBULATORY STATUS COMMUNICATION OF NEEDS Skin   Extensive Assist Verbally Bruising(brusing on knee from fall)                       Personal Care Assistance Level of Assistance  Bathing, Feeding, Dressing Bathing Assistance: Limited assistance Feeding assistance: Independent Dressing Assistance: Limited assistance     Functional Limitations Info  Sight, Hearing, Speech Sight Info: Adequate Hearing Info: Adequate Speech Info: Adequate    SPECIAL CARE FACTORS FREQUENCY  PT (By licensed PT), OT (By  licensed OT)     PT Frequency: 5x wk OT Frequency: 5x wk            Contractures Contractures Info: Not present    Additional Factors Info  Code Status, Allergies Code Status Info: DNR Allergies Info: No known allergies           Current Medications (08/08/2018):  This is the current hospital active medication list Current Facility-Administered Medications  Medication Dose Route Frequency Provider Last Rate Last Dose  . acetaminophen (TYLENOL) tablet 650 mg  650 mg Oral Q6H PRN Rise Patience, MD       Or  . acetaminophen (TYLENOL) suppository 650 mg  650 mg Rectal Q6H PRN Rise Patience, MD      . albuterol (PROVENTIL) (2.5 MG/3ML) 0.083% nebulizer solution 2.5 mg  2.5 mg Nebulization Q2H PRN Rise Patience, MD      . aspirin EC tablet 81 mg  81 mg Oral Daily Rise Patience, MD   81 mg at 08/08/18 0934  . azithromycin (ZITHROMAX) 500 mg in sodium chloride 0.9 % 250 mL IVPB  500 mg Intravenous Q24H Rise Patience, MD 250 mL/hr at 08/07/18 2013 500 mg at 08/07/18 2013  . budesonide (PULMICORT) nebulizer solution 0.25 mg  0.25 mg Nebulization BID Rise Patience, MD   0.25 mg at 08/08/18 0846  . cefTRIAXone (ROCEPHIN) 2 g in sodium chloride 0.9 % 100 mL IVPB  2 g Intravenous Q24H Rise Patience, MD 200 mL/hr at 08/07/18 2005 2 g at 08/07/18 2005  . chlorhexidine (Gustine)  0.12 % solution 15 mL  15 mL Mouth Rinse BID Alla Feeling, Wladmits, MD   15 mL at 08/08/18 0935  . dextromethorphan-guaiFENesin (MUCINEX DM) 30-600 MG per 12 hr tablet 1 tablet  1 tablet Oral BID Lovey Newcomer T, NP   1 tablet at 08/08/18 0934  . docusate sodium (COLACE) capsule 100 mg  100 mg Oral BID PRN Rise Patience, MD      . enoxaparin (LOVENOX) injection 40 mg  40 mg Subcutaneous Q24H Rise Patience, MD   40 mg at 08/07/18 2138  . feeding supplement (ENSURE ENLIVE) (ENSURE ENLIVE) liquid 237 mL  237 mL Oral BID BM Rise Patience, MD   237 mL at  08/07/18 0912  . ipratropium-albuterol (DUONEB) 0.5-2.5 (3) MG/3ML nebulizer solution 3 mL  3 mL Nebulization TID Georgette Shell, MD      . losartan (COZAAR) tablet 100 mg  100 mg Oral Daily Rise Patience, MD   100 mg at 08/08/18 0934  . MEDLINE mouth rinse  15 mL Mouth Rinse q12n4p [facility], Wladmits, MD   15 mL at 08/07/18 1250  . methylPREDNISolone sodium succinate (SOLU-MEDROL) 40 mg/mL injection 40 mg  40 mg Intravenous Q12H Rise Patience, MD   40 mg at 08/08/18 0935  . multivitamin with minerals tablet 1 tablet  1 tablet Oral Daily Rise Patience, MD   1 tablet at 08/08/18 0935  . ondansetron (ZOFRAN) tablet 4 mg  4 mg Oral Q6H PRN Rise Patience, MD       Or  . ondansetron [facility]) injection 4 mg  4 mg Intravenous Q6H PRN Rise Patience, MD      . pantoprazole (PROTONIX) EC tablet 40 mg  40 mg Oral Daily Rise Patience, MD   40 mg at 08/08/18 0737     Discharge Medications: Please see discharge summary for a list of discharge medications.  Relevant Imaging Results:  Relevant Lab Results:   Additional Information ssn: [id number]  Wende Neighbors, LCSW

## 2018-08-08 NOTE — Progress Notes (Signed)
Patient removed from 2 L [place] post breathing treatment. RA sat after 20 min 90-92%. RT will continue to monitor patient.

## 2018-08-09 DIAGNOSIS — I10 Essential (primary) hypertension: Secondary | ICD-10-CM

## 2018-08-09 LAB — CULTURE, RESPIRATORY W GRAM STAIN: Culture: NORMAL

## 2018-08-09 LAB — CULTURE, RESPIRATORY

## 2018-08-09 MED ORDER — AZITHROMYCIN 250 MG PO TABS
500.0000 mg | ORAL_TABLET | ORAL | Status: DC
  Administered 2018-08-09: 500 mg via ORAL
  Filled 2018-08-09: qty 2

## 2018-08-09 MED ORDER — IPRATROPIUM-ALBUTEROL 0.5-2.5 (3) MG/3ML IN SOLN
3.0000 mL | Freq: Two times a day (BID) | RESPIRATORY_TRACT | Status: DC
  Administered 2018-08-09 – 2018-08-10 (×2): 3 mL via RESPIRATORY_TRACT
  Filled 2018-08-09 (×2): qty 3

## 2018-08-09 NOTE — Progress Notes (Signed)
Clinical Social Worker following patient for support and discharge needs. CSW gave patient a list of facilities that have made a bed offer for patient to choice from. Patient requested that CSW contact patients daughter Catalina Lunger via phone to inform her of bed offers so she is able to choice facility on his behalf. CSW spoke with daughter via phone and made her aware of facility offers. Daughter stated she will be at patients room today around 2 p.m to look at bed offers. CSW to continue to follow for discharge plans.   Rhea Pink, MSW,  [place]

## 2018-08-09 NOTE — Progress Notes (Signed)
Physical Therapy Treatment Patient Details Name: William Skinner MRN: [id number] DOB: 1926/01/04 Today's Date: 08/09/2018    History of Present Illness 85 y.o. male with history of hypertension, carotid endarterectomy, possible dementia was admitted through the ER with short of breath/ hypoxia; VQ scan negative for PT; dx with ARF, pna and recent falls    PT Comments    Pt assisted to sitting EOB and then brought drop arm recliner beside pt for lateral/scoot transfer.  Pt required max assist for scooting into recliner.  NT present and assisted with transfer and aware for safe assist when pt ready for back to bed.  Continue to recommend SNF upon d/c.   Follow Up Recommendations  SNF     Equipment Recommendations  None recommended by PT    Recommendations for Other Services       Precautions / Restrictions Precautions Precautions: Fall Precaution Comments: monitor sats    Mobility  Bed Mobility Overal bed mobility: Needs Assistance Bed Mobility: Supine to Sit     Supine to sit: Mod assist     General bed mobility comments: assist for trunk upright and scooting to EOB, utilized bed pad  Transfers Overall transfer level: Needs assistance Equipment used: None Transfers: Lateral/Scoot Transfers          Lateral/Scoot Transfers: Max assist;+2 physical assistance;From elevated surface General transfer comment: pt with poor anterior pelvic tilt and forward trunk flexion, attempted to transfer with opposite hips/head technique but pt had difficulty performing, assist for scooting and knees blocked for safety (NT assisted for safety), SPO2 86% on room air during transfer however improved back to 89-90% on room air (NT and RN aware, pt attempting to wean from oxygen and on continuous pulse ox)  Ambulation/Gait                 Stairs             Wheelchair Mobility    Modified Rankin (Stroke Patients Only)       Balance                                             Cognition Arousal/Alertness: Awake/alert Behavior During Therapy: WFL for tasks assessed/performed Overall Cognitive Status: Within Functional Limits for tasks assessed                                        Exercises      General Comments        Pertinent Vitals/Pain Pain Assessment: No/denies pain    Home Living                      Prior Function            PT Goals (current goals can now be found in the care plan section) Progress towards PT goals: Progressing toward goals    Frequency    Min 2X/week      PT Plan Current plan remains appropriate    Co-evaluation              AM-PAC PT "6 Clicks" Mobility   Outcome Measure  Help needed turning from your back to your side while in a flat bed without using bedrails?: A Lot Help needed moving from lying on  your back to sitting on the side of a flat bed without using bedrails?: A Lot Help needed moving to and from a bed to a chair (including a wheelchair)?: A Lot Help needed standing up from a chair using your arms (e.g., wheelchair or bedside chair)?: A Lot Help needed to walk in hospital room?: Total Help needed climbing 3-5 steps with a railing? : Total 6 Click Score: 10    End of Session Equipment Utilized During Treatment: Gait belt Activity Tolerance: Patient limited by fatigue Patient left: with call bell/phone within reach;with family/visitor present;in chair;with chair alarm set Nurse Communication: Mobility status PT Visit Diagnosis: Muscle weakness (generalized) (M62.81);Repeated falls (R29.6)     Time: 9702-6378 PT Time Calculation (min) (ACUTE ONLY): 15 min  Charges:  $Therapeutic Activity: 8-22 mins                     Carmelia Bake, PT, DPT Acute Rehabilitation Services Office: [phone] Pager: [phone]  Trena Platt 08/09/2018, 3:15 PM

## 2018-08-09 NOTE — Progress Notes (Signed)
O2 sats 88-89% on room air.  Pt placed on O2@ 2L/[place] sats up to 92%.  Will continue to monitor.

## 2018-08-09 NOTE — Progress Notes (Signed)
Clinical Social Worker spoke with patient and family (daughter in Sports coach and son). Patients son stated he would [facility]. CSW spoke to facility and they stated they will be able to have a bed ready for patient at discharge.   Rhea Pink, MSW,  [place]

## 2018-08-09 NOTE — Progress Notes (Signed)
PROGRESS NOTE  William Skinner  MWN:[id number] DOB: [birth year] DOA: 08/06/2018 PCP: Lawerance Cruel, MD   Brief Narrative: William Skinner is a 85 y.o. male with a history of HTN, carotid stenosis s/p CEA, and possible dementia who presented from ALF with increasing falls and dyspnea for 2 weeks associated with a cough. Nebulizers and lasix were tried with ongoing hypoxia and wheezing. In the ED he required supplemental oxygen. CTA chest showed no PE but revealed RLL consolidation and irregular LUL nodule thought to represent infection. BNP 122, troponin mildly elevated. He was given IV antibiotics, nebulizer therapy, and oxygen for acute respiratory failure.   Assessment & Plan: Principal Problem:   Acute respiratory failure (HCC) Active Problems:   Essential hypertension   Generalized weakness   Diarrhea of presumed infectious origin   Community acquired pneumonia  Acute hypoxic respiratory failure: Due to pneumonia and/or bronchitis. Remote former smoker without Dx obstructive lung disease. Leukocytosis is mild, more consistent with steroid effect than untreated infection. Flu and RVP negative. BNP is equivocal though no pulmonary edema noted on CTA chest, do NOT think this patient has acute CHF at this time. - Continue antibiotics x5 days - Taper steroids to po 1/23 as wheezing has improved. - Needs pulmonology evaluation formally as outpatient  LUL nodule: Infection vs. neoplasm.  - Repeat CT chest in 4-6 weeks recommended by radiology after abx.   Multiple falls:  - SNF per PT  Troponin elevation: 0.05 > 0.04 in the absence of chest pain. Echocardiogram with preserved LV EF, G1DD with mild LVH, normal RV size and function.  - No further evaluation currently planned.  Suspected pneumatosis coli: Noted on CTA chest, not seen on subsequent dedicated CT abd/pelvis. Does not appear to be clinically salient.  HTN:  - Continue ARB  DVT prophylaxis: Lovenox Code Status:  DNR Family Communication: Son and DIL at bedside Disposition Plan: SNF if stable on tapered steroids and able to stay off supplemental oxygen 1/23  Consultants:   None  Procedures:   Echocardiogram 08/07/2018:  Left ventricle: The cavity size was normal. Wall thickness was   increased in a pattern of mild LVH. Systolic function was normal.   The estimated ejection fraction was in the range of 55% to 60%.   Wall motion was normal; there were no regional wall motion   abnormalities. Doppler parameters are consistent with abnormal   left ventricular relaxation (grade 1 diastolic dysfunction). - Aortic valve: There was no stenosis. - Mitral valve: There was no significant regurgitation. - Left atrium: The atrium was mildly dilated. - Right ventricle: The cavity size was normal. Systolic function   was normal. - Tricuspid valve: Peak RV-RA gradient (S): 19 mm Hg. - Pulmonary arteries: PA peak pressure: 22 mm Hg (S). - Inferior vena cava: The vessel was normal in size. The   respirophasic diameter changes were in the normal range (>= 50%),   consistent with normal central venous pressure.  Impressions: - Normal LV size with mild LV hypertrophy. EF 55-60%. Normal RV   size and systolic function. No significant valvular   abnormalities.  Antimicrobials:  Ceftriaxone, azithromycin 1/19 - 1/23   Subjective: Feels much better than admission, still very weak and having trouble breathing with any exertion which is not his baseline.   Objective: Vitals:   08/08/18 2239 08/09/18 0613 08/09/18 0826 08/09/18 1314  BP: (!) 127/59 129/75  125/63  Pulse: 92 80  92  Resp: (!) 24 (!) 26  19  Temp: 98.4 F (36.9 C) 98.2 F (36.8 C)  97.7 F (36.5 C)  TempSrc: Oral Oral  Oral  SpO2: 90% 94% 93% 90%  Weight:  87 kg    Height:        Intake/Output Summary (Last 24 hours) at 08/09/2018 1824 Last data filed at 08/09/2018 0940 Gross per 24 hour  Intake 714.01 ml  Output 600 ml  Net  114.01 ml   Filed Weights   08/07/18 0528 08/08/18 0540 08/09/18 0613  Weight: 86.2 kg 85.7 kg 87 kg    Gen: Pleasant, elderly male in no distress Pulm: Non-labored tachypnea. End expiratory wheezes diffusely decreased with normalization of respiratory drive. CV: Regular rate and rhythm. No murmur, rub, or gallop. No JVD, trace pedal edema. GI: Abdomen soft, non-tender, non-distended, with normoactive bowel sounds. No organomegaly or masses felt. Ext: Warm, no deformities Skin: No rashes, lesions or ulcers Neuro: Alert and oriented. No focal neurological deficits. Psych: Judgement and insight appear normal. Mood & affect appropriate.   Data Reviewed: I have personally reviewed following labs and imaging studies  CBC: Recent Labs  Lab 08/06/18 1517 08/07/18 0353 08/08/18 1127  WBC 9.7 6.4 11.1*  NEUTROABS 7.0  --   --   HGB 13.5 12.1* 12.1*  HCT 42.4 38.7* 39.8  MCV 97.0 94.6 99.0  PLT 258 227 008   Basic Metabolic Panel: Recent Labs  Lab 08/06/18 1517 08/06/18 2235 08/07/18 0353 08/08/18 1127  NA 133*  --  135 136  K 4.4  --  4.3 4.0  CL 95*  --  102 104  CO2 28  --  25 23  GLUCOSE 125*  --  161* 179*  BUN 29*  --  26* 31*  CREATININE 1.17  --  1.20 1.11  CALCIUM 8.6*  --  8.2* 8.6*  MG  --  2.1  --   --    GFR: Estimated Creatinine Clearance: 48.5 mL/min (by C-G formula based on SCr of 1.11 mg/dL). Liver Function Tests: Recent Labs  Lab 08/06/18 1517 08/07/18 0353 08/08/18 1127  AST 24 21 27   ALT 36 29 29  ALKPHOS 89 70 68  BILITOT 1.0 0.5 0.6  PROT 6.5 5.4* 5.9*  ALBUMIN 3.3* 2.7* 3.0*   Recent Labs  Lab 08/06/18 1517  LIPASE 33   No results for input(s): AMMONIA in the last 168 hours. Coagulation Profile: No results for input(s): INR, PROTIME in the last 168 hours. Cardiac Enzymes: Recent Labs  Lab 08/06/18 1517 08/06/18 2235 08/07/18 0353 08/07/18 0953  TROPONINI 0.03* 0.05* 0.05* 0.04*   BNP (last 3 results) No results for  input(s): PROBNP in the last 8760 hours. HbA1C: No results for input(s): HGBA1C in the last 72 hours. CBG: No results for input(s): GLUCAP in the last 168 hours. Lipid Profile: No results for input(s): CHOL, HDL, LDLCALC, TRIG, CHOLHDL, LDLDIRECT in the last 72 hours. Thyroid Function Tests: Recent Labs    08/06/18 2235  TSH 0.601   Anemia Panel: No results for input(s): VITAMINB12, FOLATE, FERRITIN, TIBC, IRON, RETICCTPCT in the last 72 hours. Urine analysis:    Component Value Date/Time   COLORURINE YELLOW 08/06/2018 [place] 08/06/2018 1518   LABSPEC 1.012 08/06/2018 1518   PHURINE 5.0 08/06/2018 [place] 08/06/2018 1518   HGBUR NEGATIVE 08/06/2018 1518   BILIRUBINUR NEGATIVE 08/06/2018 1518   [place] 08/06/2018 1518   PROTEINUR NEGATIVE 08/06/2018 1518   NITRITE NEGATIVE 08/06/2018 1518   LEUKOCYTESUR  NEGATIVE 08/06/2018 1518   Recent Results (from the past 240 hour(s))  Urine culture     Status: Abnormal   Collection Time: 08/06/18  3:18 PM  Result Value Ref Range Status   Specimen Description URINE, CLEAN CATCH  Final   Special Requests   Final    Normal Performed at [facility] [address], [place], [place] [address]    Culture <10,000 COLONIES/mL INSIGNIFICANT GROWTH (A)  Final   Report Status 08/07/2018 FINAL  Final  Respiratory Panel by PCR     Status: None   Collection Time: 08/06/18  4:09 PM  Result Value Ref Range Status   Adenovirus NOT DETECTED NOT DETECTED Final   Coronavirus 229E NOT DETECTED NOT DETECTED Final   Coronavirus HKU1 NOT DETECTED NOT DETECTED Final   Coronavirus NL63 NOT DETECTED NOT DETECTED Final   Coronavirus OC43 NOT DETECTED NOT DETECTED Final   Metapneumovirus NOT DETECTED NOT DETECTED Final   Rhinovirus / Enterovirus NOT DETECTED NOT DETECTED Final   Influenza A NOT DETECTED NOT DETECTED Final   Influenza B NOT DETECTED NOT DETECTED Final   Parainfluenza Virus  1 NOT DETECTED NOT DETECTED Final   Parainfluenza Virus 2 NOT DETECTED NOT DETECTED Final   Parainfluenza Virus 3 NOT DETECTED NOT DETECTED Final   Parainfluenza Virus 4 NOT DETECTED NOT DETECTED Final   Respiratory Syncytial Virus NOT DETECTED NOT DETECTED Final   Bordetella pertussis NOT DETECTED NOT DETECTED Final   Chlamydophila pneumoniae NOT DETECTED NOT DETECTED Final   Mycoplasma pneumoniae NOT DETECTED NOT DETECTED Final    Comment: Performed at [facility], [place] [address], [place], [place] [address]  Blood Culture (routine x 2)     Status: None (Preliminary result)   Collection Time: 08/06/18  7:20 PM  Result Value Ref Range Status   Specimen Description BLOOD LEFT ANTECUBITAL  Final   Special Requests   Final    BOTTLES DRAWN AEROBIC AND ANAEROBIC Blood Culture adequate volume Performed at [facility] [address] [place], [place] [address]    Culture   Final    NO GROWTH 3 DAYS Performed at [facility], [place] [address] [place], [place] [address]    Report Status PENDING  Incomplete  Blood Culture (routine x 2)     Status: None (Preliminary result)   Collection Time: 08/06/18  7:25 PM  Result Value Ref Range Status   Specimen Description   Final    BLOOD RIGHT ANTECUBITAL Performed at [facility] [address] [place], [place] [address]    Special Requests   Final    BOTTLES DRAWN AEROBIC AND ANAEROBIC Blood Culture adequate volume Performed at [facility] [address] [place], [place] [address]    Culture   Final    NO GROWTH 3 DAYS Performed at [facility], [place] [address] [place],  [address]    Report Status PENDING  Incomplete  MRSA PCR Screening     Status: None   Collection Time: 08/06/18  7:47 PM  Result Value Ref Range Status   MRSA by PCR NEGATIVE NEGATIVE Final    Comment:        The GeneXpert MRSA Assay (FDA approved for NASAL specimens only), is one  component of a comprehensive MRSA colonization surveillance program. It is not intended to diagnose MRSA infection nor to guide or monitor treatment for MRSA infections. Performed at [facility],  [place] [address] [place], [place] [address]   Culture, sputum-assessment     Status: None   Collection Time: 08/07/18  6:35 AM  Result Value Ref Range Status   Specimen Description SPUTUM  Final   Special Requests NONE  Final   Sputum evaluation   Final    THIS SPECIMEN IS ACCEPTABLE FOR SPUTUM CULTURE Performed at [facility], [place] [address] [place], [place] [address]    Report Status 08/07/2018 FINAL  Final  Culture, respiratory     Status: None   Collection Time: 08/07/18  6:35 AM  Result Value Ref Range Status   Specimen Description   Final    SPUTUM Performed at [facility] [address], [place], [place] [address]    Special Requests   Final    NONE Reflexed from [id number] Performed at [facility] [address], [place], [place] [address]    Gram Stain   Final    FEW WBC PRESENT, PREDOMINANTLY PMN RARE GRAM POSITIVE COCCI IN PAIRS FEW GRAM POSITIVE RODS FEW GRAM NEGATIVE RODS FEW SQUAMOUS EPITHELIAL CELLS PRESENT Performed at [facility], [place] [address] [place], [place] [address]    Culture FEW Consistent with normal respiratory flora.  Final   Report Status 08/09/2018 FINAL  Final      Radiology Studies: No results found.  Scheduled Meds: . aspirin EC  81 mg Oral Daily  . azithromycin  500 mg Oral Q24H  . budesonide (PULMICORT) nebulizer solution  0.25 mg Nebulization BID  . chlorhexidine  15 mL Mouth Rinse BID  . dextromethorphan-guaiFENesin  1 tablet Oral BID  . enoxaparin (LOVENOX) injection  40 mg Subcutaneous Q24H  . feeding supplement (ENSURE ENLIVE)  237 mL Oral BID BM  . ipratropium-albuterol  3 mL Nebulization BID  . losartan  100 mg Oral Daily  . mouth rinse  15 mL  Mouth Rinse q12n4p  . methylPREDNISolone (SOLU-MEDROL) injection  40 mg Intravenous Q12H  . multivitamin with minerals  1 tablet Oral Daily  . pantoprazole  40 mg Oral Daily   Continuous Infusions: . cefTRIAXone (ROCEPHIN)  IV 2 g (08/08/18 1905)     LOS: 3 days   Time spent: 25 minutes.  Patrecia Pour, MD [facility] [email or web address] Password [facility] 08/09/2018, 6:24 PM

## 2018-08-10 DIAGNOSIS — Z96651 Presence of right artificial knee joint: Secondary | ICD-10-CM | POA: Diagnosis present

## 2018-08-10 DIAGNOSIS — I6389 Other cerebral infarction: Secondary | ICD-10-CM | POA: Diagnosis not present

## 2018-08-10 DIAGNOSIS — N281 Cyst of kidney, acquired: Secondary | ICD-10-CM | POA: Diagnosis not present

## 2018-08-10 DIAGNOSIS — I700 Atherosclerosis of aorta: Secondary | ICD-10-CM | POA: Diagnosis present

## 2018-08-10 DIAGNOSIS — R4781 Slurred speech: Secondary | ICD-10-CM | POA: Diagnosis not present

## 2018-08-10 DIAGNOSIS — R4701 Aphasia: Secondary | ICD-10-CM | POA: Diagnosis present

## 2018-08-10 DIAGNOSIS — H53461 Homonymous bilateral field defects, right side: Secondary | ICD-10-CM | POA: Diagnosis present

## 2018-08-10 DIAGNOSIS — R29810 Facial weakness: Secondary | ICD-10-CM | POA: Diagnosis present

## 2018-08-10 DIAGNOSIS — Z7952 Long term (current) use of systemic steroids: Secondary | ICD-10-CM | POA: Diagnosis not present

## 2018-08-10 DIAGNOSIS — F0390 Unspecified dementia without behavioral disturbance: Secondary | ICD-10-CM | POA: Diagnosis not present

## 2018-08-10 DIAGNOSIS — I10 Essential (primary) hypertension: Secondary | ICD-10-CM | POA: Diagnosis present

## 2018-08-10 DIAGNOSIS — J189 Pneumonia, unspecified organism: Secondary | ICD-10-CM | POA: Diagnosis not present

## 2018-08-10 DIAGNOSIS — R918 Other nonspecific abnormal finding of lung field: Secondary | ICD-10-CM | POA: Diagnosis not present

## 2018-08-10 DIAGNOSIS — I6602 Occlusion and stenosis of left middle cerebral artery: Secondary | ICD-10-CM | POA: Diagnosis not present

## 2018-08-10 DIAGNOSIS — R531 Weakness: Secondary | ICD-10-CM | POA: Diagnosis not present

## 2018-08-10 DIAGNOSIS — I63412 Cerebral infarction due to embolism of left middle cerebral artery: Secondary | ICD-10-CM | POA: Diagnosis present

## 2018-08-10 DIAGNOSIS — R251 Tremor, unspecified: Secondary | ICD-10-CM | POA: Diagnosis present

## 2018-08-10 DIAGNOSIS — R400 Somnolence: Secondary | ICD-10-CM | POA: Diagnosis not present

## 2018-08-10 DIAGNOSIS — Z9181 History of falling: Secondary | ICD-10-CM | POA: Diagnosis not present

## 2018-08-10 DIAGNOSIS — Z6824 Body mass index (BMI) 24.0-24.9, adult: Secondary | ICD-10-CM | POA: Diagnosis not present

## 2018-08-10 DIAGNOSIS — R0902 Hypoxemia: Secondary | ICD-10-CM | POA: Diagnosis not present

## 2018-08-10 DIAGNOSIS — R2689 Other abnormalities of gait and mobility: Secondary | ICD-10-CM | POA: Diagnosis not present

## 2018-08-10 DIAGNOSIS — E785 Hyperlipidemia, unspecified: Secondary | ICD-10-CM | POA: Diagnosis present

## 2018-08-10 DIAGNOSIS — H5340 Unspecified visual field defects: Secondary | ICD-10-CM | POA: Diagnosis present

## 2018-08-10 DIAGNOSIS — Z7982 Long term (current) use of aspirin: Secondary | ICD-10-CM | POA: Diagnosis not present

## 2018-08-10 DIAGNOSIS — Z7401 Bed confinement status: Secondary | ICD-10-CM | POA: Diagnosis not present

## 2018-08-10 DIAGNOSIS — G8191 Hemiplegia, unspecified affecting right dominant side: Secondary | ICD-10-CM | POA: Diagnosis present

## 2018-08-10 DIAGNOSIS — Z66 Do not resuscitate: Secondary | ICD-10-CM | POA: Diagnosis present

## 2018-08-10 DIAGNOSIS — R29716 NIHSS score 16: Secondary | ICD-10-CM | POA: Diagnosis present

## 2018-08-10 DIAGNOSIS — E669 Obesity, unspecified: Secondary | ICD-10-CM | POA: Diagnosis present

## 2018-08-10 DIAGNOSIS — Z79899 Other long term (current) drug therapy: Secondary | ICD-10-CM | POA: Diagnosis not present

## 2018-08-10 DIAGNOSIS — Z87891 Personal history of nicotine dependence: Secondary | ICD-10-CM | POA: Diagnosis not present

## 2018-08-10 DIAGNOSIS — I6300 Cerebral infarction due to thrombosis of unspecified precerebral artery: Secondary | ICD-10-CM | POA: Diagnosis not present

## 2018-08-10 DIAGNOSIS — J9601 Acute respiratory failure with hypoxia: Secondary | ICD-10-CM | POA: Diagnosis not present

## 2018-08-10 DIAGNOSIS — R471 Dysarthria and anarthria: Secondary | ICD-10-CM | POA: Diagnosis present

## 2018-08-10 DIAGNOSIS — J9602 Acute respiratory failure with hypercapnia: Secondary | ICD-10-CM | POA: Diagnosis present

## 2018-08-10 DIAGNOSIS — M2550 Pain in unspecified joint: Secondary | ICD-10-CM | POA: Diagnosis not present

## 2018-08-10 DIAGNOSIS — I639 Cerebral infarction, unspecified: Secondary | ICD-10-CM | POA: Diagnosis present

## 2018-08-10 MED ORDER — CEFDINIR 300 MG PO CAPS: 300.0000 mg | ORAL_CAPSULE | Freq: Two times a day (BID) | ORAL | Status: DC

## 2018-08-10 MED ORDER — FUROSEMIDE 40 MG PO TABS
40.0000 mg | ORAL_TABLET | Freq: Every day | ORAL | Status: DC
  Administered 2018-08-10: 40 mg via ORAL
  Filled 2018-08-10: qty 1

## 2018-08-10 MED ORDER — AZITHROMYCIN 250 MG PO TABS: 250.0000 mg | ORAL_TABLET | Freq: Every day | ORAL | 0 refills | Status: AC

## 2018-08-10 MED ORDER — PREDNISONE 10 MG PO TABS: ORAL_TABLET | ORAL | Status: DC

## 2018-08-10 MED ORDER — LOPERAMIDE HCL 2 MG PO CAPS: 2.0000 mg | ORAL_CAPSULE | ORAL | 0 refills | Status: AC | PRN

## 2018-08-10 MED ORDER — CEFDINIR 300 MG PO CAPS
300.0000 mg | ORAL_CAPSULE | Freq: Two times a day (BID) | ORAL | Status: DC
  Filled 2018-08-10: qty 1

## 2018-08-10 MED ORDER — PREDNISONE 20 MG PO TABS: 40.0000 mg | ORAL_TABLET | Freq: Every day | ORAL | Status: DC

## 2018-08-10 MED ORDER — ENSURE ENLIVE PO LIQD: 237.0000 mL | Freq: Two times a day (BID) | ORAL | 12 refills | Status: AC

## 2018-08-10 MED ORDER — DOCUSATE SODIUM 100 MG PO CAPS: 100.0000 mg | ORAL_CAPSULE | Freq: Two times a day (BID) | ORAL | Status: AC | PRN

## 2018-08-10 MED ORDER — FUROSEMIDE 40 MG PO TABS: 40.0000 mg | ORAL_TABLET | Freq: Every day | ORAL | Status: AC

## 2018-08-10 NOTE — Clinical Social Work Placement (Signed)
   CLINICAL SOCIAL WORK PLACEMENT  NOTE  Date:  08/10/2018  Patient Details  Name: EZRAH PANNING MRN: [id number] Date of Birth: 10-Nov-1925  Clinical Social Work is seeking post-discharge placement for this patient at the [place] level of care (*CSW will initial, date and re-position this form in  chart as items are completed):  Yes   Patient/family provided with [place] Work Department's list of facilities offering this level of care within the geographic area requested by the patient (or if unable, by the patient's family).  Yes   Patient/family informed of their freedom to choose among providers that offer the needed level of care, that participate in Medicare, Medicaid or managed care program needed by the patient, have an available bed and are willing to accept the patient.  Yes   Patient/family informed of [facility]'s ownership interest in [facility] and [facility], as well as of the fact that they are under no obligation to receive care at these facilities.  PASRR submitted to EDS on       PASRR number received on       Existing PASRR number confirmed on 08/10/18     FL2 transmitted to all facilities in geographic area requested by pt/family on 08/10/18     FL2 transmitted to all facilities within larger geographic area on       Patient informed that his/her managed care company has contracts with or will negotiate with certain facilities, including the following:            Patient/family informed of bed offers received.  Patient chooses bed at [facility]     Physician recommends and patient chooses bed at      Patient to be transferred to [facility] on 08/10/18.  Patient to be transferred to facility by ptar     Patient family notified on 08/10/18 of transfer.  Name of family member notified:  daughter in law aware     PHYSICIAN       Additional Comment:     _______________________________________________ Wende Neighbors, LCSW 08/10/2018, 1:52 PM

## 2018-08-10 NOTE — Discharge Summary (Signed)
Physician Discharge Summary  William Skinner:[id number] DOB: 1926-02-03 DOA: 08/06/2018  PCP: [facility]   Admit date: 08/06/2018 Discharge date: 08/10/2018  Admitted From: ALF Disposition: SNF   Recommendations for Outpatient Follow-up:  1. Follow up with PCP in 1-2 weeks 2. Establish care with pulmonology for formal testing and management. 3. Recommend CT chest in 4-6 weeks to follow up LUL nodule seen on CTA chest here.  4. Please obtain BMP/CBC in one week  Home Health: N/A Equipment/Devices: 2L O2, otherwise per SNF Discharge Condition: Stable CODE STATUS: DNR Diet recommendation: Heart healthy  Brief/Interim Summary: JAYEL INKS is a 85 y.o. (turns 4 in 2 days) male with a history of HTN, carotid stenosis s/p CEA, and possible dementia who presented from ALF with increasing falls and dyspnea for 2 weeks associated with a cough. His daughter-in-law, a Therapist, sports, reports wheezing off and on for 3 years, but no formal diagnosis of COPD, or other lung disease, has a very remote history of smoking. Nebulizers and lasix were tried with ongoing hypoxia and wheezing. In the ED he required supplemental oxygen. CTA chest showed no PE but revealed RLL consolidation and irregular LUL nodule thought to represent infection. BNP 122, troponin mildly elevated. He was given IV antibiotics, nebulizer therapy, and oxygen for acute respiratory failure. His appetite has improved and respiratory status is better. It appears he continues to be deconditioned and SNF is recommended for rehabilitation to regain PLOF. He continues to require oxygen and will need formal pulmonology evaluation in the outpatient setting.   Discharge Diagnoses:  Principal Problem:   Acute respiratory failure ([place]) Active Problems:   Essential hypertension   Generalized weakness   Diarrhea of presumed infectious origin   Community acquired pneumonia  Acute hypoxic respiratory failure: Due to pneumonia and/or  bronchitis, possibly has had progressive lung disease based on chronicity per DIL. Remote former smoker without Dx obstructive lung disease. Leukocytosis is mild, more consistent with steroid effect than untreated infection. Flu and RVP negative. BNP is equivocal though no pulmonary edema noted on CTA chest, and no crackles on exam. Do not think this patient has acute CHF at this time. - Continue antibiotics x5 days, so needs nightly azithromycin tonight and last dose 1/24 PM and cefdinir BID (first dose tonight and last dose 1/24 PM) - Tapered steroids to po 1/23 as wheezing has improved. Will continue to require supplemental oxygen and regular breathing treatments (duonebs BID, albuterol prn). Will taper prednisone as well.  - Needs pulmonology evaluation formally as outpatient - Continue lasix daily and monitoring weights and renal function.  LUL nodule: Infection vs. neoplasm.  - Repeat CT chest in 4-6 weeks recommended by radiology after abx.   Multiple falls:  - SNF per PT  Troponin elevation: 0.05 > 0.04 in the absence of chest pain. Echocardiogram with preserved LV EF, G1DD with mild LVH, normal RV size and function.  - No further evaluation currently planned.  Possible pneumatosis coli: Noted on CTA chest, not seen on subsequent dedicated CT abd/pelvis. Does not appear to be clinically salient.  HTN:  - Continue ARB  Discharge Instructions Discharge Instructions    Diet - low sodium heart healthy   Complete by:  As directed    Increase activity slowly   Complete by:  As directed      Allergies as of 08/10/2018   No Known Allergies     Medication List    STOP taking these medications   baclofen  10 MG tablet Commonly known as:  LIORESAL   cephALEXin 500 MG capsule Commonly known as:  KEFLEX   HYDROcodone-acetaminophen 5-325 MG tablet Commonly known as:  NORCO   rivaroxaban 10 MG Tabs tablet Commonly known as:  XARELTO     TAKE these medications    acetaminophen 500 MG tablet Commonly known as:  TYLENOL Take 1,000 mg by mouth 3 (three) times daily as needed for moderate pain or headache. Reported on 07/29/2015   albuterol (2.5 MG/3ML) 0.083% nebulizer solution Commonly known as:  PROVENTIL Take 2.5 mg by nebulization every 4 (four) hours as needed for wheezing or shortness of breath.   albuterol 108 (90 Base) MCG/ACT inhaler Commonly known as:  PROVENTIL HFA;VENTOLIN HFA Inhale 2 puffs into the lungs every 6 (six) hours as needed for wheezing or shortness of breath.   aspirin 81 MG tablet Take 81 mg by mouth daily.   azithromycin 250 MG tablet Commonly known as:  ZITHROMAX Take 1 tablet (250 mg total) by mouth daily for 2 days.   cefdinir 300 MG capsule Commonly known as:  OMNICEF Take 1 capsule (300 mg total) by mouth every 12 (twelve) hours.   clobetasol cream 0.05 % Commonly known as:  TEMOVATE Apply 1 application topically 2 (two) times daily. Apply daily or bid to Bilteral lower extremity rash. What changed:    when to take this  reasons to take this  additional instructions   docusate sodium 100 MG capsule Commonly known as:  COLACE Take 1 capsule (100 mg total) by mouth 2 (two) times daily as needed for mild constipation.   feeding supplement (ENSURE ENLIVE) Liqd Take 237 mLs by mouth 2 (two) times daily between meals.   furosemide 40 MG tablet Commonly known as:  LASIX Take 1 tablet (40 mg total) by mouth daily for 6 days. What changed:  when to take this   ipratropium-albuterol 0.5-2.5 (3) MG/3ML Soln Commonly known as:  DUONEB Take 3 mLs by nebulization 2 (two) times daily. What changed:  Another medication with the same name was removed. Continue taking this medication, and follow the directions you see here.   loperamide 2 MG capsule Commonly known as:  IMODIUM A-D Take 1 capsule (2 mg total) by mouth every 4 (four) hours as needed for diarrhea or loose stools.   losartan 100 MG  tablet Commonly known as:  COZAAR Take 100 mg by mouth daily.   multivitamin with minerals Tabs tablet Take 1 tablet by mouth daily.   ondansetron 4 MG tablet Commonly known as:  ZOFRAN Take 1 tablet (4 mg total) by mouth every 8 (eight) hours as needed for nausea or vomiting. What changed:  when to take this   predniSONE 10 MG tablet Commonly known as:  DELTASONE Take 4 tablets (40 mg total) by mouth daily with breakfast for 3 days, THEN 3 tablets (30 mg total) daily with breakfast for 3 days, THEN 2 tablets (20 mg total) daily with breakfast for 3 days, THEN 1 tablet (10 mg total) daily with breakfast for 3 days. Start taking on:  August 11, 2018       Contact information for follow-up providers    Lawerance Cruel, MD Follow up.   Specialty:  Family Medicine Contact information: [address] [place] [address] [place], [facility]. Schedule an appointment as soon as possible for a visit.   Specialty:  Geriatric Medicine Contact information: [place]  [address] [place] [address] [phone]            Contact information for after-discharge care    [facility] .   Service:  Skilled Nursing Contact information: [address] [place] [phone]                 No Known Allergies  Consultations:  None  Procedures/Studies: Dg Chest 2 View  Result Date: 08/06/2018 CLINICAL DATA:  Productive cough for 2 days.  Shortness of breath. EXAM: CHEST - 2 VIEW COMPARISON:  None. FINDINGS: Cardiomegaly. Tortuous thoracic aorta. The hila and mediastinum otherwise unremarkable. Mild opacity in the lateral left lung base favored represent atelectasis or scar rather than infiltrate. No other acute abnormalities identified. IMPRESSION: Opacity in the lateral left lung base favored to represent atelectasis or scar rather than infiltrate. No other acute  abnormalities. Electronically Signed   By: Dorise Bullion III M.D   On: 08/06/2018 15:36   Ct Angio Chest Pe W And/or Wo Contrast  Result Date: 08/06/2018 CLINICAL DATA:  Patient presented to ed with c/o frequent fall, diarrhea, possible UTI. Patient was seen at PCP on wed and had chest x-ray which was negative. Patient lives in [place] patient been vomiting and diarrhea in the past two week.*comment was truncated*^52mL ISOVUE-370 IOPAMIDOL (ISOVUE-370) INJECTION 76%PE suspected, high pretest prob EXAM: CT ANGIOGRAPHY CHEST WITH CONTRAST TECHNIQUE: Multidetector CT imaging of the chest was performed using the standard protocol during bolus administration of intravenous contrast. Multiplanar CT image reconstructions and MIPs were obtained to evaluate the vascular anatomy. CONTRAST:  65mL ISOVUE-370 IOPAMIDOL (ISOVUE-370) INJECTION 76% COMPARISON:  Radiograph 08/06/2018 FINDINGS: Cardiovascular: No filling defects within the pulmonary arteries to suggest acute pulmonary embolism. No acute findings of the aorta or great vessels. No pericardial fluid. Mediastinum/Nodes: No axillary or supraclavicular adenopathy. No mediastinal hilar adenopathy. No pericardial effusion. Esophagus normal. Lungs/Pleura: No pulmonary infarction.  No pneumonia. LEFT upper lobe nodule measuring 9 mm by 9 mm (image 58/10 has irregular margin a somewhat branching pattern (image 75/8). There is atelectasis in the medial aspect of the RIGHT lower lobe (image 77/10) Upper Abdomen: There is gas within the wall a loop of bowel anterior to the liver (image 89/4) favored ascending colon. Potential small focus of gas within the portal system of the liver (image 86 and 88 of series 4) within the RIGHT hepatic lobe. These findings are concerning for pneumatosis intestinalis and portal venous gas. Musculoskeletal: No aggressive osseous lesion. Review of the MIP images confirms the above findings. IMPRESSION: 1. Pneumatosis intestinalis of the  ascending colon and potential portal venous gas. Recommend CT of the abdomen pelvis to evaluate for ischemia versus benign pneumatosis intestinalis. 2. No evidence of acute pulmonary embolism. 3. Small focus consolidation in the medial RIGHT lower lobe is favored atelectasis. Can not exclude a small focus of infection. 4. Irregular nodule in the LEFT upper lobe with differential including small focus infection versus neoplasm. Favor infection. Recommend follow-up CT thorax in 4-6 weeks to re-evaluate. Findings conveyed toJOSHUA LONG on 08/06/2018  at18:01. Electronically Signed   By: Suzy Bouchard M.D.   On: 08/06/2018 18:01   Ct Abdomen Pelvis W Contrast  Result Date: 08/06/2018 CLINICAL DATA:  Diarrhea and possible UTI. EXAM: CT ABDOMEN AND PELVIS WITH CONTRAST TECHNIQUE: Multidetector CT imaging of the abdomen and pelvis was performed using the standard protocol following bolus administration of intravenous contrast. CONTRAST:  1mL ISOVUE-300 IOPAMIDOL (ISOVUE-300) INJECTION  61% COMPARISON:  None. FINDINGS: Lower chest: Mild peripheral scar of the lung bases are noted. The heart size is enlarged. Hepatobiliary: No focal lesions identified in the liver. The gallbladder is decompressed limiting evaluation. No biliary ductal dilatation is identified. Pancreas: Unremarkable. No pancreatic ductal dilatation or surrounding inflammatory changes. Spleen: Normal in size without focal abnormality. Adrenals/Urinary Tract: The bilateral adrenal glands are normal. Simple cysts are noted in both kidneys. Bilateral parapelvic renal cysts are identified. There is no hydronephrosis bilaterally. The bladder is normal. Stomach/Bowel: Stomach is within normal limits. The appendix is not seen. No inflammatory changes noted around the cecum. No evidence of bowel wall thickening, distention, or inflammatory changes. Vascular/Lymphatic: Atherosclerosis of the aorta is identified. Mild ectasia of distal abdominal aorta is noted.  There is no abdominal lymphadenopathy. Reproductive: Prostate is unremarkable. Other: None. Musculoskeletal: Degenerative joint changes of the spine are identified. Chronic compression deformity of L5 is noted. IMPRESSION: No bowel obstruction or diverticulitis identified. Bilateral kidney cysts.  No hydronephrosis bilaterally. No acute abnormality identified in the abdomen and pelvis. Electronically Signed   By: Abelardo Diesel M.D.   On: 08/06/2018 19:14    Subjective: Feels well, breathing much better. Eating 100% meals. Still not getting out of bed due to diffuse weakness and dyspnea on exertion. Mild cough reported. No chest pain.   Discharge Exam: Vitals:   08/10/18 0550 08/10/18 0814  BP: 121/71   Pulse: 72   Resp: 18   Temp: 97.8 F (36.6 C)   SpO2: 95% 92%   General: Pt is alert, awake, not in acute distress HEENT: Left ectropion, stable Cardiovascular: RRR, S1/S2 +, no rubs, no gallops Respiratory: Nonlabored, good air movement with end-expiratory wheezing improved from prior exams. No crackles. Abdominal: Soft, NT, ND, bowel sounds + Extremities: Trace dependent edema, no cyanosis  Labs: BNP (last 3 results) Recent Labs    08/06/18 1558  BNP 656.8*   Basic Metabolic Panel: Recent Labs  Lab 08/06/18 1517 08/06/18 2235 08/07/18 0353 08/08/18 1127  NA 133*  --  135 136  K 4.4  --  4.3 4.0  CL 95*  --  102 104  CO2 28  --  25 23  GLUCOSE 125*  --  161* 179*  BUN 29*  --  26* 31*  CREATININE 1.17  --  1.20 1.11  CALCIUM 8.6*  --  8.2* 8.6*  MG  --  2.1  --   --    Liver Function Tests: Recent Labs  Lab 08/06/18 1517 08/07/18 0353 08/08/18 1127  AST 24 21 27   ALT 36 29 29  ALKPHOS 89 70 68  BILITOT 1.0 0.5 0.6  PROT 6.5 5.4* 5.9*  ALBUMIN 3.3* 2.7* 3.0*   Recent Labs  Lab 08/06/18 1517  LIPASE 33   No results for input(s): AMMONIA in the last 168 hours. CBC: Recent Labs  Lab 08/06/18 1517 08/07/18 0353 08/08/18 1127  WBC 9.7 6.4 11.1*   NEUTROABS 7.0  --   --   HGB 13.5 12.1* 12.1*  HCT 42.4 38.7* 39.8  MCV 97.0 94.6 99.0  PLT 258 227 272   Cardiac Enzymes: Recent Labs  Lab 08/06/18 1517 08/06/18 2235 08/07/18 0353 08/07/18 0953  TROPONINI 0.03* 0.05* 0.05* 0.04*   BNP: Invalid input(s): POCBNP CBG: No results for input(s): GLUCAP in the last 168 hours. D-Dimer No results for input(s): DDIMER in the last 72 hours. Hgb A1c No results for input(s): HGBA1C in the last 72 hours. Lipid Profile No  results for input(s): CHOL, HDL, LDLCALC, TRIG, CHOLHDL, LDLDIRECT in the last 72 hours. Thyroid function studies No results for input(s): TSH, T4TOTAL, T3FREE, THYROIDAB in the last 72 hours.  Invalid input(s): FREET3 Anemia work up No results for input(s): VITAMINB12, FOLATE, FERRITIN, TIBC, IRON, RETICCTPCT in the last 72 hours. Urinalysis    Component Value Date/Time   COLORURINE YELLOW 08/06/2018 1518   APPEARANCEUR CLEAR 08/06/2018 1518   LABSPEC 1.012 08/06/2018 1518   PHURINE 5.0 08/06/2018 1518   GLUCOSEU NEGATIVE 08/06/2018 1518   HGBUR NEGATIVE 08/06/2018 1518   BILIRUBINUR NEGATIVE 08/06/2018 1518   KETONESUR NEGATIVE 08/06/2018 1518   PROTEINUR NEGATIVE 08/06/2018 1518   NITRITE NEGATIVE 08/06/2018 1518   [place] 08/06/2018 1518    Microbiology Recent Results (from the past 240 hour(s))  Urine culture     Status: Abnormal   Collection Time: 08/06/18  3:18 PM  Result Value Ref Range Status   Specimen Description URINE, CLEAN CATCH  Final   Special Requests   Final    Normal Performed at [facility], [place] [address] [place], [place] [address]    Culture <10,000 COLONIES/mL INSIGNIFICANT GROWTH (A)  Final   Report Status 08/07/2018 FINAL  Final  Respiratory Panel by PCR     Status: None   Collection Time: 08/06/18  4:09 PM  Result Value Ref Range Status   Adenovirus NOT DETECTED NOT DETECTED Final   Coronavirus 229E NOT DETECTED NOT DETECTED Final    Coronavirus HKU1 NOT DETECTED NOT DETECTED Final   Coronavirus NL63 NOT DETECTED NOT DETECTED Final   Coronavirus OC43 NOT DETECTED NOT DETECTED Final   Metapneumovirus NOT DETECTED NOT DETECTED Final   Rhinovirus / Enterovirus NOT DETECTED NOT DETECTED Final   Influenza A NOT DETECTED NOT DETECTED Final   Influenza B NOT DETECTED NOT DETECTED Final   Parainfluenza Virus 1 NOT DETECTED NOT DETECTED Final   Parainfluenza Virus 2 NOT DETECTED NOT DETECTED Final   Parainfluenza Virus 3 NOT DETECTED NOT DETECTED Final   Parainfluenza Virus 4 NOT DETECTED NOT DETECTED Final   Respiratory Syncytial Virus NOT DETECTED NOT DETECTED Final   Bordetella pertussis NOT DETECTED NOT DETECTED Final   Chlamydophila pneumoniae NOT DETECTED NOT DETECTED Final   Mycoplasma pneumoniae NOT DETECTED NOT DETECTED Final    Comment: Performed at [facility], [place] [address] [place], [place] [address]  Blood Culture (routine x 2)     Status: None (Preliminary result)   Collection Time: 08/06/18  7:20 PM  Result Value Ref Range Status   Specimen Description BLOOD LEFT ANTECUBITAL  Final   Special Requests   Final    BOTTLES DRAWN AEROBIC AND ANAEROBIC Blood Culture adequate volume Performed at [place] [address], [place], [place] [address]    Culture   Final    NO GROWTH 4 DAYS Performed at [facility], [place] [address] [place], [place] [address]    Report Status PENDING  Incomplete  Blood Culture (routine x 2)     Status: None (Preliminary result)   Collection Time: 08/06/18  7:25 PM  Result Value Ref Range Status   Specimen Description   Final    BLOOD RIGHT ANTECUBITAL Performed at [place] [address] [place], [place] [address]    Special Requests   Final    BOTTLES DRAWN AEROBIC AND ANAEROBIC Blood Culture adequate volume Performed at [place] [address] [place],  [place] [address]    Culture   Final     NO GROWTH 4 DAYS Performed at [facility], [place] [address], [place], [place] [address]    Report Status PENDING  Incomplete  MRSA PCR Screening     Status: None   Collection Time: 08/06/18  7:47 PM  Result Value Ref Range Status   MRSA by PCR NEGATIVE NEGATIVE Final    Comment:        The GeneXpert MRSA Assay (FDA approved for NASAL specimens only), is one component of a comprehensive MRSA colonization surveillance program. It is not intended to diagnose MRSA infection nor to guide or monitor treatment for MRSA infections. Performed at [facility] [place] [facility], [place] [address] [place], [place] [address]   Culture, sputum-assessment     Status: None   Collection Time: 08/07/18  6:35 AM  Result Value Ref Range Status   Specimen Description SPUTUM  Final   Special Requests NONE  Final   Sputum evaluation   Final    THIS SPECIMEN IS ACCEPTABLE FOR SPUTUM CULTURE Performed at [facility], [place] [address] [place], [place] [address]    Report Status 08/07/2018 FINAL  Final  Culture, respiratory     Status: None   Collection Time: 08/07/18  6:35 AM  Result Value Ref Range Status   Specimen Description   Final    SPUTUM Performed at [place] [address] [place], [place] [address]    Special Requests   Final    NONE Reflexed from [phone] Performed at [facility] [address], [place], [place] [address]    Gram Stain   Final    FEW WBC PRESENT, PREDOMINANTLY PMN RARE GRAM POSITIVE COCCI IN PAIRS FEW GRAM POSITIVE RODS FEW GRAM NEGATIVE RODS FEW SQUAMOUS EPITHELIAL CELLS PRESENT Performed at [facility], [place] [address], [place], [place] [address]    Culture FEW Consistent with normal respiratory flora.  Final   Report Status 08/09/2018 FINAL  Final    Time coordinating discharge: Approximately 40 minutes  Patrecia Pour, MD  [facility] 08/10/2018, 1:38 PM Pager [phone]

## 2018-08-10 NOTE — Progress Notes (Signed)
Clinical Social Worker facilitated patient discharge including contacting patient family and facility to confirm patient discharge plans.  Clinical information faxed to facility and family agreeable with plan.  CSW arranged ambulance transport via [facility] to [facility] .  RN to call [phone] ([facility] [place]) for report prior to discharge.  Clinical Social Worker will sign off for now as social work intervention is no longer needed. Please consult [place] again if new need arises.  Rhea Pink, MSW, [place]

## 2018-08-10 NOTE — Progress Notes (Signed)
CSW received a call from the admissions admin Freda Munro at [facility] who wanted to confirm pt was still D/C'ing to [facility].  CSW spoke to the ED secretary at [facility] who confirmed pt is [facility]th on [employer].  Freda Munro voiced understanding, was appreciative and thanked the [facility] [place].  CSW will continue to follow for D/C needs.  Alphonse Guild. Tristen Pennino, LCSW, LCAS, CSI Clinical Social Worker Ph: [phone]

## 2018-08-10 NOTE — Care Management Important Message (Signed)
Important Message  Patient Details  Name: William Skinner MRN: [id number] Date of Birth: 1926/04/23   Medicare Important Message Given:  Yes    Kerin Salen 08/10/2018, 11:38 AMImportant Message  Patient Details  Name: William Skinner MRN: [id number] Date of Birth: 03/10/1926   Medicare Important Message Given:  Yes    Kerin Salen 08/10/2018, 11:38 AM

## 2018-08-11 LAB — CULTURE, BLOOD (ROUTINE X 2)
Culture: NO GROWTH
Culture: NO GROWTH
Special Requests: ADEQUATE
Special Requests: ADEQUATE

## 2018-08-17 DIAGNOSIS — J189 Pneumonia, unspecified organism: Secondary | ICD-10-CM | POA: Diagnosis not present

## 2018-08-17 DIAGNOSIS — I10 Essential (primary) hypertension: Secondary | ICD-10-CM | POA: Diagnosis not present

## 2018-08-17 DIAGNOSIS — F0390 Unspecified dementia without behavioral disturbance: Secondary | ICD-10-CM | POA: Diagnosis not present

## 2018-08-17 DIAGNOSIS — R2689 Other abnormalities of gait and mobility: Secondary | ICD-10-CM | POA: Diagnosis not present

## 2018-08-17 DIAGNOSIS — R531 Weakness: Secondary | ICD-10-CM | POA: Diagnosis not present

## 2018-08-21 ENCOUNTER — Emergency Department (HOSPITAL_COMMUNITY): Payer: Medicare Other

## 2018-08-21 ENCOUNTER — Inpatient Hospital Stay (HOSPITAL_COMMUNITY): Payer: Medicare Other

## 2018-08-21 ENCOUNTER — Other Ambulatory Visit (HOSPITAL_COMMUNITY): Payer: Medicare Other

## 2018-08-21 ENCOUNTER — Inpatient Hospital Stay (HOSPITAL_COMMUNITY)
Admission: EM | Admit: 2018-08-21 | Discharge: 2018-08-24 | DRG: 061 | Disposition: A | Discharge status: Skilled Nursing Facility | Payer: Medicare Other | Source: Skilled Nursing Facility | Attending: Neurology | Admitting: Neurology

## 2018-08-21 DIAGNOSIS — H53461 Homonymous bilateral field defects, right side: Secondary | ICD-10-CM | POA: Diagnosis present

## 2018-08-21 DIAGNOSIS — I63412 Cerebral infarction due to embolism of left middle cerebral artery: Secondary | ICD-10-CM | POA: Diagnosis not present

## 2018-08-21 DIAGNOSIS — Z96651 Presence of right artificial knee joint: Secondary | ICD-10-CM | POA: Diagnosis present

## 2018-08-21 DIAGNOSIS — Z23 Encounter for immunization: Secondary | ICD-10-CM | POA: Diagnosis not present

## 2018-08-21 DIAGNOSIS — Z7189 Other specified counseling: Secondary | ICD-10-CM

## 2018-08-21 DIAGNOSIS — I639 Cerebral infarction, unspecified: Secondary | ICD-10-CM | POA: Diagnosis present

## 2018-08-21 DIAGNOSIS — I69351 Hemiplegia and hemiparesis following cerebral infarction affecting right dominant side: Secondary | ICD-10-CM | POA: Diagnosis not present

## 2018-08-21 DIAGNOSIS — R471 Dysarthria and anarthria: Secondary | ICD-10-CM | POA: Diagnosis present

## 2018-08-21 DIAGNOSIS — R52 Pain, unspecified: Secondary | ICD-10-CM | POA: Diagnosis not present

## 2018-08-21 DIAGNOSIS — R251 Tremor, unspecified: Secondary | ICD-10-CM | POA: Diagnosis present

## 2018-08-21 DIAGNOSIS — Z7952 Long term (current) use of systemic steroids: Secondary | ICD-10-CM | POA: Diagnosis not present

## 2018-08-21 DIAGNOSIS — E785 Hyperlipidemia, unspecified: Secondary | ICD-10-CM | POA: Diagnosis present

## 2018-08-21 DIAGNOSIS — R29716 NIHSS score 16: Secondary | ICD-10-CM | POA: Diagnosis present

## 2018-08-21 DIAGNOSIS — R21 Rash and other nonspecific skin eruption: Secondary | ICD-10-CM | POA: Diagnosis not present

## 2018-08-21 DIAGNOSIS — R531 Weakness: Secondary | ICD-10-CM

## 2018-08-21 DIAGNOSIS — R4701 Aphasia: Secondary | ICD-10-CM | POA: Diagnosis present

## 2018-08-21 DIAGNOSIS — I6300 Cerebral infarction due to thrombosis of unspecified precerebral artery: Secondary | ICD-10-CM | POA: Diagnosis not present

## 2018-08-21 DIAGNOSIS — Z7982 Long term (current) use of aspirin: Secondary | ICD-10-CM | POA: Diagnosis not present

## 2018-08-21 DIAGNOSIS — J9602 Acute respiratory failure with hypercapnia: Secondary | ICD-10-CM | POA: Diagnosis present

## 2018-08-21 DIAGNOSIS — Z9181 History of falling: Secondary | ICD-10-CM

## 2018-08-21 DIAGNOSIS — Z79899 Other long term (current) drug therapy: Secondary | ICD-10-CM

## 2018-08-21 DIAGNOSIS — K5900 Constipation, unspecified: Secondary | ICD-10-CM | POA: Diagnosis not present

## 2018-08-21 DIAGNOSIS — R0902 Hypoxemia: Secondary | ICD-10-CM | POA: Diagnosis not present

## 2018-08-21 DIAGNOSIS — H109 Unspecified conjunctivitis: Secondary | ICD-10-CM | POA: Diagnosis not present

## 2018-08-21 DIAGNOSIS — E669 Obesity, unspecified: Secondary | ICD-10-CM | POA: Diagnosis present

## 2018-08-21 DIAGNOSIS — R2681 Unsteadiness on feet: Secondary | ICD-10-CM | POA: Diagnosis not present

## 2018-08-21 DIAGNOSIS — I700 Atherosclerosis of aorta: Secondary | ICD-10-CM | POA: Diagnosis present

## 2018-08-21 DIAGNOSIS — G8191 Hemiplegia, unspecified affecting right dominant side: Secondary | ICD-10-CM | POA: Diagnosis present

## 2018-08-21 DIAGNOSIS — Z66 Do not resuscitate: Secondary | ICD-10-CM | POA: Diagnosis present

## 2018-08-21 DIAGNOSIS — I6389 Other cerebral infarction: Secondary | ICD-10-CM | POA: Diagnosis not present

## 2018-08-21 DIAGNOSIS — G459 Transient cerebral ischemic attack, unspecified: Secondary | ICD-10-CM | POA: Diagnosis not present

## 2018-08-21 DIAGNOSIS — Z6824 Body mass index (BMI) 24.0-24.9, adult: Secondary | ICD-10-CM

## 2018-08-21 DIAGNOSIS — I69320 Aphasia following cerebral infarction: Secondary | ICD-10-CM | POA: Diagnosis not present

## 2018-08-21 DIAGNOSIS — M179 Osteoarthritis of knee, unspecified: Secondary | ICD-10-CM | POA: Diagnosis not present

## 2018-08-21 DIAGNOSIS — R29810 Facial weakness: Secondary | ICD-10-CM | POA: Diagnosis present

## 2018-08-21 DIAGNOSIS — R5381 Other malaise: Secondary | ICD-10-CM

## 2018-08-21 DIAGNOSIS — Z87891 Personal history of nicotine dependence: Secondary | ICD-10-CM

## 2018-08-21 DIAGNOSIS — R400 Somnolence: Secondary | ICD-10-CM | POA: Diagnosis not present

## 2018-08-21 DIAGNOSIS — R410 Disorientation, unspecified: Secondary | ICD-10-CM | POA: Diagnosis not present

## 2018-08-21 DIAGNOSIS — Z111 Encounter for screening for respiratory tuberculosis: Secondary | ICD-10-CM | POA: Diagnosis not present

## 2018-08-21 DIAGNOSIS — J9601 Acute respiratory failure with hypoxia: Secondary | ICD-10-CM | POA: Diagnosis present

## 2018-08-21 DIAGNOSIS — M2550 Pain in unspecified joint: Secondary | ICD-10-CM | POA: Diagnosis not present

## 2018-08-21 DIAGNOSIS — R41841 Cognitive communication deficit: Secondary | ICD-10-CM | POA: Diagnosis not present

## 2018-08-21 DIAGNOSIS — M6281 Muscle weakness (generalized): Secondary | ICD-10-CM | POA: Diagnosis not present

## 2018-08-21 DIAGNOSIS — H5340 Unspecified visual field defects: Secondary | ICD-10-CM | POA: Diagnosis present

## 2018-08-21 DIAGNOSIS — I10 Essential (primary) hypertension: Secondary | ICD-10-CM | POA: Diagnosis present

## 2018-08-21 DIAGNOSIS — R4789 Other speech disturbances: Secondary | ICD-10-CM | POA: Diagnosis not present

## 2018-08-21 DIAGNOSIS — Z7401 Bed confinement status: Secondary | ICD-10-CM | POA: Diagnosis not present

## 2018-08-21 DIAGNOSIS — R739 Hyperglycemia, unspecified: Secondary | ICD-10-CM | POA: Diagnosis not present

## 2018-08-21 DIAGNOSIS — I6602 Occlusion and stenosis of left middle cerebral artery: Secondary | ICD-10-CM | POA: Diagnosis not present

## 2018-08-21 DIAGNOSIS — G8190 Hemiplegia, unspecified affecting unspecified side: Secondary | ICD-10-CM | POA: Diagnosis not present

## 2018-08-21 DIAGNOSIS — R4781 Slurred speech: Secondary | ICD-10-CM | POA: Diagnosis not present

## 2018-08-21 LAB — COMPREHENSIVE METABOLIC PANEL
ALT: 26 U/L (ref 0–44)
AST: 21 U/L (ref 15–41)
Albumin: 3.2 g/dL — ABNORMAL LOW (ref 3.5–5.0)
Alkaline Phosphatase: 73 U/L (ref 38–126)
Anion gap: 11 (ref 5–15)
BUN: 37 mg/dL — ABNORMAL HIGH (ref 8–23)
CO2: 25 mmol/L (ref 22–32)
Calcium: 8.8 mg/dL — ABNORMAL LOW (ref 8.9–10.3)
Chloride: 100 mmol/L (ref 98–111)
Creatinine, Ser: 1.32 mg/dL — ABNORMAL HIGH (ref 0.61–1.24)
GFR calc Af Amer: 54 mL/min — ABNORMAL LOW (ref >60)
GFR calc non Af Amer: 47 mL/min — ABNORMAL LOW (ref >60)
Glucose, Bld: 152 mg/dL — ABNORMAL HIGH (ref 70–99)
Potassium: 4.1 mmol/L (ref 3.5–5.1)
Sodium: 136 mmol/L (ref 135–145)
Total Bilirubin: 0.8 mg/dL (ref 0.3–1.2)
Total Protein: 5.9 g/dL — ABNORMAL LOW (ref 6.5–8.1)

## 2018-08-21 LAB — CBC
HCT: 44.6 % (ref 39.0–52.0)
Hemoglobin: 13.8 g/dL (ref 13.0–17.0)
MCH: 29.8 pg (ref 26.0–34.0)
MCHC: 30.9 g/dL (ref 30.0–36.0)
MCV: 96.3 fL (ref 80.0–100.0)
Platelets: 227 10*3/uL (ref 150–400)
RBC: 4.63 MIL/uL (ref 4.22–5.81)
RDW: 13.2 % (ref 11.5–15.5)
WBC: 10.4 10*3/uL (ref 4.0–10.5)
nRBC: 0 % (ref 0.0–0.2)

## 2018-08-21 LAB — DIFFERENTIAL
Abs Immature Granulocytes: 0.11 10*3/uL — ABNORMAL HIGH (ref 0.00–0.07)
Basophils Absolute: 0.1 10*3/uL (ref 0.0–0.1)
Basophils Relative: 1 %
Eosinophils Absolute: 0.2 10*3/uL (ref 0.0–0.5)
Eosinophils Relative: 2 %
Immature Granulocytes: 1 %
Lymphocytes Relative: 7 %
Lymphs Abs: 0.7 10*3/uL (ref 0.7–4.0)
Monocytes Absolute: 0.8 10*3/uL (ref 0.1–1.0)
Monocytes Relative: 7 %
Neutro Abs: 8.6 10*3/uL — ABNORMAL HIGH (ref 1.7–7.7)
Neutrophils Relative %: 82 %

## 2018-08-21 LAB — PROTIME-INR
INR: 1.05
PROTHROMBIN TIME: 13.6 s (ref 11.4–15.2)

## 2018-08-21 LAB — I-STAT TROPONIN, ED: Troponin i, poc: 0.03 ng/mL (ref 0.00–0.08)

## 2018-08-21 LAB — CBG MONITORING, ED: Glucose-Capillary: 139 mg/dL — ABNORMAL HIGH (ref 70–99)

## 2018-08-21 LAB — APTT: aPTT: 32 seconds (ref 24–36)

## 2018-08-21 LAB — I-STAT CREATININE, ED: Creatinine, Ser: 1.3 mg/dL — ABNORMAL HIGH (ref 0.61–1.24)

## 2018-08-21 MED ORDER — IPRATROPIUM-ALBUTEROL 0.5-2.5 (3) MG/3ML IN SOLN: 3.0000 mL | Freq: Two times a day (BID) | RESPIRATORY_TRACT | Status: DC

## 2018-08-21 MED ORDER — PREDNISONE 5 MG PO TABS
10.0000 mg | ORAL_TABLET | Freq: Every day | ORAL | Status: AC
  Administered 2018-08-22 – 2018-08-23 (×2): 10 mg via ORAL
  Filled 2018-08-21: qty 2
  Filled 2018-08-21: qty 1

## 2018-08-21 MED ORDER — LOSARTAN POTASSIUM 50 MG PO TABS
100.0000 mg | ORAL_TABLET | Freq: Every day | ORAL | Status: DC
  Administered 2018-08-23 – 2018-08-24 (×2): 100 mg via ORAL
  Filled 2018-08-21 (×3): qty 2

## 2018-08-21 MED ORDER — PANTOPRAZOLE SODIUM 40 MG IV SOLR
40.0000 mg | Freq: Every day | INTRAVENOUS | Status: DC
  Administered 2018-08-21 – 2018-08-22 (×2): 40 mg via INTRAVENOUS
  Filled 2018-08-21 (×2): qty 40

## 2018-08-21 MED ORDER — IOPAMIDOL (ISOVUE-370) INJECTION 76%
75.0000 mL | Freq: Once | INTRAVENOUS | Status: AC | PRN
  Administered 2018-08-21: 75 mL via INTRAVENOUS

## 2018-08-21 MED ORDER — FUROSEMIDE 40 MG PO TABS
40.0000 mg | ORAL_TABLET | Freq: Every day | ORAL | Status: DC
  Administered 2018-08-23 – 2018-08-24 (×2): 40 mg via ORAL
  Filled 2018-08-21: qty 2
  Filled 2018-08-21 (×2): qty 1

## 2018-08-21 MED ORDER — SENNOSIDES-DOCUSATE SODIUM 8.6-50 MG PO TABS: 1.0000 | ORAL_TABLET | Freq: Every evening | ORAL | Status: DC | PRN

## 2018-08-21 MED ORDER — SODIUM CHLORIDE 0.9 % IV SOLN
INTRAVENOUS | Status: DC
  Administered 2018-08-21 – 2018-08-23 (×4): via INTRAVENOUS

## 2018-08-21 MED ORDER — NICARDIPINE HCL IN NACL 20-0.86 MG/200ML-% IV SOLN: 0.0000 mg/h | INTRAVENOUS | Status: DC

## 2018-08-21 MED ORDER — ALBUTEROL SULFATE (2.5 MG/3ML) 0.083% IN NEBU: 2.5000 mg | INHALATION_SOLUTION | RESPIRATORY_TRACT | Status: DC | PRN

## 2018-08-21 MED ORDER — IPRATROPIUM-ALBUTEROL 0.5-2.5 (3) MG/3ML IN SOLN
3.0000 mL | Freq: Two times a day (BID) | RESPIRATORY_TRACT | Status: DC
  Administered 2018-08-21 – 2018-08-24 (×5): 3 mL via RESPIRATORY_TRACT
  Filled 2018-08-21 (×6): qty 3

## 2018-08-21 MED ORDER — SODIUM CHLORIDE 0.9% FLUSH
3.0000 mL | Freq: Once | INTRAVENOUS | Status: DC
Start: 2018-08-21 — End: 2018-08-22

## 2018-08-21 MED ORDER — ORAL CARE MOUTH RINSE
15.0000 mL | Freq: Two times a day (BID) | OROMUCOSAL | Status: DC
  Administered 2018-08-21 – 2018-08-24 (×6): 15 mL via OROMUCOSAL

## 2018-08-21 MED ORDER — ALTEPLASE (STROKE) FULL DOSE INFUSION
0.9000 mg/kg | Freq: Once | INTRAVENOUS | Status: AC
  Administered 2018-08-21: 74.7 mg via INTRAVENOUS
  Filled 2018-08-21: qty 100

## 2018-08-21 MED ORDER — STROKE: EARLY STAGES OF RECOVERY BOOK
Freq: Once | Status: AC
  Administered 2018-08-22: 1
  Filled 2018-08-21: qty 1

## 2018-08-21 MED ORDER — CEFDINIR 300 MG PO CAPS: 300.0000 mg | ORAL_CAPSULE | Freq: Two times a day (BID) | ORAL | Status: DC

## 2018-08-21 NOTE — ED Provider Notes (Signed)
[facility] EMERGENCY DEPARTMENT Provider Note   CSN: [id number] Arrival date & time: 08/21/18  1222   An emergency department physician performed an initial assessment on this suspected stroke patient at 1223.  History   Chief Complaint Chief Complaint  Patient presents with  . Code Stroke    HPI William Skinner is a 85 y.o. male.  85 year old male with prior medical history as detailed below presents for evaluation following acute onset of weakness.  Last known normal at 11:30 AM.  Upon initial arrival to the ED this patient was seen immediately by the neuro stroke team.  Patient was deemed to be appropriate candidate for TPA.  The history is provided by the patient and medical records.  Illness  Location:  Acute weakness, acute CVA Severity:  Severe Onset quality:  Sudden Timing:  Rare Progression:  Unchanged Chronicity:  New   Past Medical History:  Diagnosis Date  . Back pain   . Carotid artery occlusion   . Hyperlipidemia   . Hypertension   . Primary osteoarthritis of knee    right  . Right leg pain     Patient Active Problem List   Diagnosis Date Noted  . Stroke (cerebrum) ([place]) 08/21/2018  . Acute respiratory failure ([place]) 08/06/2018  . Generalized weakness 08/06/2018  . Diarrhea of presumed infectious origin 08/06/2018  . Community acquired pneumonia 08/06/2018  . H/O carotid endarterectomy 06/01/2016  . Primary osteoarthritis of right knee 05/31/2016  . Essential hypertension 05/31/2016  . H/O spinal stenosis 05/31/2016  . Pain in joint, lower leg 03/06/2014    Past Surgical History:  Procedure Laterality Date  . CAROTID ENDARTERECTOMY Left   . CATARACT EXTRACTION Bilateral   . COLONOSCOPY W/ BIOPSIES AND POLYPECTOMY    . HERNIA REPAIR    . KNEE SURGERY Left   . NOSE SURGERY    . TONSILLECTOMY    . TOTAL KNEE ARTHROPLASTY Right 06/15/2016   Procedure: RIGHT TOTAL KNEE ARTHROPLASTY;  Surgeon: Renette Butters, MD;  Location:  [place];  Service: Orthopedics;  Laterality: Right;        Home Medications    Prior to Admission medications   Medication Sig Start Date End Date Taking? Authorizing Provider  acetaminophen (TYLENOL) 500 MG tablet Take 1,000 mg by mouth 3 (three) times daily as needed for moderate pain or headache. Reported on 07/29/2015   Yes [provider]  albuterol (PROVENTIL HFA;VENTOLIN HFA) 108 (90 Base) MCG/ACT inhaler Inhale 2 puffs into the lungs every 6 (six) hours as needed for wheezing or shortness of breath.   Yes [provider]  albuterol (PROVENTIL) (2.5 MG/3ML) 0.083% nebulizer solution Take 2.5 mg by nebulization every 4 (four) hours as needed for wheezing or shortness of breath.   Yes [provider]  aspirin 81 MG tablet Take 81 mg by mouth daily.    Yes [provider]  docusate sodium (COLACE) 100 MG capsule Take 1 capsule (100 mg total) by mouth 2 (two) times daily as needed for mild constipation. 08/10/18  Yes Patrecia Pour, MD  furosemide (LASIX) 40 MG tablet Take 1 tablet (40 mg total) by mouth daily for 6 days. Patient taking differently: Take 40 mg by mouth daily. Hold for systolic <299 3/71/69 12/24/87 Yes Patrecia Pour, MD  loperamide (IMODIUM A-D) 2 MG capsule Take 1 capsule (2 mg total) by mouth every 4 (four) hours as needed for diarrhea or loose stools. 08/10/18  Yes Patrecia Pour, MD  losartan (COZAAR) 100 MG tablet Take 100 mg by mouth daily.   Yes [provider]  Multiple Vitamins-Minerals (CERTA-VITE PO) Take 1 tablet by mouth daily.   Yes [provider]  Nutritional Supplements (RESOURCE 2.0) LIQD Take 237 mLs by mouth 2 (two) times daily between meals. Vanilla flavor   Yes [provider]  ondansetron (ZOFRAN) 4 MG tablet Take 1 tablet (4 mg total) by mouth every 8 (eight) hours as needed for nausea or vomiting. 06/15/16  Yes Prudencio Burly III, PA-C  clobetasol cream (TEMOVATE) 5.36 % Apply 1  application topically 2 (two) times daily. Apply daily or bid to Bilteral lower extremity rash. Patient not taking: Reported on 08/21/2018 08/19/16   Tanna Furry, MD  feeding supplement, ENSURE ENLIVE, (ENSURE ENLIVE) LIQD Take 237 mLs by mouth 2 (two) times daily between meals. Patient not taking: Reported on 08/21/2018 08/10/18   Patrecia Pour, MD  predniSONE (DELTASONE) 10 MG tablet Take 4 tablets (40 mg total) by mouth daily with breakfast for 3 days, THEN 3 tablets (30 mg total) daily with breakfast for 3 days, THEN 2 tablets (20 mg total) daily with breakfast for 3 days, THEN 1 tablet (10 mg total) daily with breakfast for 3 days. 08/11/18 08/23/18  Patrecia Pour, MD    Family History Family History  Problem Relation Age of Onset  . Diabetes Mellitus II Neg Hx     Social History Social History   Tobacco Use  . Smoking status: Former Smoker    Last attempt to quit: 03/06/1964    Years since quitting: 54.4  . Smokeless tobacco: Never Used  Substance Use Topics  . Alcohol use: Yes    Comment: occasional beer  . Drug use: No     Allergies   Patient has no known allergies.   Review of Systems Review of Systems  Unable to perform ROS: Acuity of condition     Physical Exam Updated Vital Signs BP 116/64   Pulse 85   Temp 97.6 F (36.4 C) (Oral)   Resp (!) 24   Ht 6' (1.829 m)   Wt 83 kg   SpO2 94%   BMI 24.82 kg/m   Physical Exam Vitals signs and nursing note reviewed.  Constitutional:      General: He is not in acute distress.    Appearance: He is well-developed.  HENT:     Head: Normocephalic and atraumatic.  Eyes:     Conjunctiva/sclera: Conjunctivae normal.     Pupils: Pupils are equal, round, and reactive to light.  Neck:     Musculoskeletal: Normal range of motion and neck supple.  Cardiovascular:     Rate and Rhythm: Normal rate and regular rhythm.     Heart sounds: Normal heart sounds.  Pulmonary:     Effort: Pulmonary effort is normal. No respiratory  distress.     Breath sounds: Normal breath sounds.  Abdominal:     General: There is no distension.     Palpations: Abdomen is soft.     Tenderness: There is no abdominal tenderness.  Musculoskeletal: Normal range of motion.        General: No deformity.  Skin:    General: Skin is warm and dry.  Neurological:     Mental Status: He is alert.     Comments: Right hemianopsia Right facial droop Right upper extremity is around 5 strength  5 out of 5 strength for other extremities      ED  Treatments / Results  Labs (all labs ordered are listed, but only abnormal results are displayed) Labs Reviewed  DIFFERENTIAL - Abnormal; Notable for the following components:      Result Value   Neutro Abs 8.6 (*)    Abs Immature Granulocytes 0.11 (*)    All other components within normal limits  COMPREHENSIVE METABOLIC PANEL - Abnormal; Notable for the following components:   Glucose, Bld 152 (*)    BUN 37 (*)    Creatinine, Ser 1.32 (*)    Calcium 8.8 (*)    Total Protein 5.9 (*)    Albumin 3.2 (*)    GFR calc non Af Amer 47 (*)    GFR calc Af Amer 54 (*)    All other components within normal limits  CBG MONITORING, ED - Abnormal; Notable for the following components:   Glucose-Capillary 139 (*)    All other components within normal limits  I-STAT CREATININE, ED - Abnormal; Notable for the following components:   Creatinine, Ser 1.30 (*)    All other components within normal limits  PROTIME-INR  APTT  CBC  I-STAT TROPONIN, ED    EKG None  Radiology Ct Angio Head W Or Wo Contrast  Result Date: 08/21/2018 CLINICAL DATA:  Acute onset right arm and leg weakness and difficulty speaking. EXAM: CT ANGIOGRAPHY HEAD AND NECK TECHNIQUE: Multidetector CT imaging of the head and neck was performed using the standard protocol during bolus administration of intravenous contrast. Multiplanar CT image reconstructions and MIPs were obtained to evaluate the vascular anatomy. Carotid stenosis  measurements (when applicable) are obtained utilizing NASCET criteria, using the distal internal carotid diameter as the denominator. CONTRAST:  74mL ISOVUE-370 IOPAMIDOL (ISOVUE-370) INJECTION 76% COMPARISON:  None. FINDINGS: CTA NECK FINDINGS Aortic arch: Standard 3 vessel aortic arch. Prominent irregular soft plaque in the aortic arch. No arch vessel origin stenosis. Right carotid system: Patent with predominantly calcified plaque at the carotid bifurcation and in the proximal ICA without significant stenosis. Mild beading of the distal cervical ICA. Left carotid system: Patent with evidence of prior endarterectomy. No significant stenosis or dissection. Vertebral arteries: Patent with the right being mildly dominant. No significant stenosis or dissection. Skeleton: Moderate to severe disc and asymmetric left-sided facet arthrosis in the cervical spine. Other neck: No evidence of acute abnormality or mass. Upper chest: Mild biapical pleuroparenchymal scarring. Review of the MIP images confirms the above findings CTA HEAD FINDINGS Anterior circulation: The internal carotid arteries are patent from skull base to carotid termini with mild-to-moderate atherosclerotic plaque bilaterally not resulting in significant stenosis. The right A1 segment is absent. The left A1 and bilateral M1 segments are widely patent. There is proximal to mid left M2 inferior division occlusion with mild distal collateralization. No aneurysm is identified. Posterior circulation: The intracranial vertebral arteries are patent to the basilar with mild calcified plaque on the right not resulting in significant stenosis. Patent left PICA, right AICA, and bilateral SCA origins are identified. The basilar artery is widely patent. PCAs are patent with atherosclerotic irregularity bilaterally most notably involving the branch vessels without evidence of flow limiting proximal stenosis. No aneurysm is identified. Venous sinuses: As permitted by  contrast timing, patent. Anatomic variants: Absent right A1. Review of the MIP images confirms the above findings IMPRESSION: 1. Proximal to mid left M2 inferior division branch occlusion. 2. Intracranial atherosclerosis without significant proximal stenosis. 3. Patent cervical carotid and vertebral arteries without significant stenosis. 4.  Aortic Atherosclerosis (ICD10-I70.0). These results were communicated to  Dr. Cheral Marker at 12:59 pm on 08/21/2018 by text page via the [facility] messaging system. Electronically Signed   By: Logan Bores M.D.   On: 08/21/2018 13:16   Ct Angio Neck W Or Wo Contrast  Result Date: 08/21/2018 CLINICAL DATA:  Acute onset right arm and leg weakness and difficulty speaking. EXAM: CT ANGIOGRAPHY HEAD AND NECK TECHNIQUE: Multidetector CT imaging of the head and neck was performed using the standard protocol during bolus administration of intravenous contrast. Multiplanar CT image reconstructions and MIPs were obtained to evaluate the vascular anatomy. Carotid stenosis measurements (when applicable) are obtained utilizing NASCET criteria, using the distal internal carotid diameter as the denominator. CONTRAST:  48mL ISOVUE-370 IOPAMIDOL (ISOVUE-370) INJECTION 76% COMPARISON:  None. FINDINGS: CTA NECK FINDINGS Aortic arch: Standard 3 vessel aortic arch. Prominent irregular soft plaque in the aortic arch. No arch vessel origin stenosis. Right carotid system: Patent with predominantly calcified plaque at the carotid bifurcation and in the proximal ICA without significant stenosis. Mild beading of the distal cervical ICA. Left carotid system: Patent with evidence of prior endarterectomy. No significant stenosis or dissection. Vertebral arteries: Patent with the right being mildly dominant. No significant stenosis or dissection. Skeleton: Moderate to severe disc and asymmetric left-sided facet arthrosis in the cervical spine. Other neck: No evidence of acute abnormality or mass. Upper chest: Mild  biapical pleuroparenchymal scarring. Review of the MIP images confirms the above findings CTA HEAD FINDINGS Anterior circulation: The internal carotid arteries are patent from skull base to carotid termini with mild-to-moderate atherosclerotic plaque bilaterally not resulting in significant stenosis. The right A1 segment is absent. The left A1 and bilateral M1 segments are widely patent. There is proximal to mid left M2 inferior division occlusion with mild distal collateralization. No aneurysm is identified. Posterior circulation: The intracranial vertebral arteries are patent to the basilar with mild calcified plaque on the right not resulting in significant stenosis. Patent left PICA, right AICA, and bilateral SCA origins are identified. The basilar artery is widely patent. PCAs are patent with atherosclerotic irregularity bilaterally most notably involving the branch vessels without evidence of flow limiting proximal stenosis. No aneurysm is identified. Venous sinuses: As permitted by contrast timing, patent. Anatomic variants: Absent right A1. Review of the MIP images confirms the above findings IMPRESSION: 1. Proximal to mid left M2 inferior division branch occlusion. 2. Intracranial atherosclerosis without significant proximal stenosis. 3. Patent cervical carotid and vertebral arteries without significant stenosis. 4.  Aortic Atherosclerosis (ICD10-I70.0). These results were communicated to Dr. Cheral Marker at 12:59 pm on 08/21/2018 by text page via the [facility] messaging system. Electronically Signed   By: Logan Bores M.D.   On: 08/21/2018 13:16   Dg Chest Port 1 View  Result Date: 08/21/2018 CLINICAL DATA:  From nursing home with altered level of consciousness. EXAM: PORTABLE CHEST 1 VIEW COMPARISON:  08/06/2018 chest radiograph FINDINGS: Stable cardiac silhouette within normal limits given projection and technique. Aortic atherosclerosis with calcification. Stable small opacity within the lingula, probably  scarring or atelectasis. No new consolidation. No pleural effusion or pneumothorax. No acute osseous abnormality is evident. IMPRESSION: No active disease. Stable small opacity within the lingula probably scarring or atelectasis. Aortic Atherosclerosis (ICD10-I70.0). Electronically Signed   By: Kristine Garbe M.D.   On: 08/21/2018 13:37   Ct Head Code Stroke Wo Contrast`  Result Date: 08/21/2018 CLINICAL DATA:  Code stroke. Right arm and leg weakness and difficulty speaking. EXAM: CT HEAD WITHOUT CONTRAST TECHNIQUE: Contiguous axial images were obtained from the base of  the skull through the vertex without intravenous contrast. COMPARISON:  None. FINDINGS: Brain: There is loss of gray-white differentiation involving the left parietal lobe and posterior left frontal lobe consistent with acute infarct. There may also be involvement of the posterior left insula and adjacent posterior left temporal lobe. Lacunar infarcts at the anterior aspect of the left basal ganglia and in the left thalamus are likely chronic. Patchy to confluent cerebral white matter hypodensities bilaterally are nonspecific but compatible with moderately advanced chronic small vessel ischemic disease. There is also moderate cerebral atrophy. No intracranial hemorrhage, mass, midline shift, or extra-axial fluid collection is identified. Vascular: Hyperdense left MCA branch in the sylvian fissure. Calcified atherosclerosis at the skull base. Skull: No fracture or focal osseous lesion. Sinuses/Orbits: Postsurgical changes in the paranasal sinuses with mild scattered mucosal thickening. Trace right mastoid effusion. Bilateral cataract extraction. Other: None. ASPECTS [facility] Stroke Program Early CT Score) - Ganglionic level infarction (caudate, lentiform nuclei, internal capsule, insula, M1-M3 cortex): 6 - Supraganglionic infarction (M4-M6 cortex): 1 Total score (0-10 with 10 being normal): 7 IMPRESSION: 1. Acute nonhemorrhagic left MCA  infarct with hyperdense left M2 branch vessel. 2. ASPECTS is 7. 3. Moderate chronic small vessel ischemic disease and cerebral atrophy. These results were communicated to Dr. Cheral Marker at 12:59 pm on 08/21/2018 by text page via the [facility] messaging system. Electronically Signed   By: Logan Bores M.D.   On: 08/21/2018 13:03    Procedures Procedures (including critical care time) CRITICAL CARE Performed by: Valarie Merino   Total critical care time: 30 minutes  Critical care time was exclusive of separately billable procedures and treating other patients.  Critical care was necessary to treat or prevent imminent or life-threatening deterioration.  Critical care was time spent personally by me on the following activities: development of treatment plan with patient and/or surrogate as well as nursing, discussions with consultants, evaluation of patient's response to treatment, examination of patient, obtaining history from patient or surrogate, ordering and performing treatments and interventions, ordering and review of laboratory studies, ordering and review of radiographic studies, pulse oximetry and re-evaluation of patient's condition.   Medications Ordered in ED Medications  sodium chloride flush (NS) 0.9 % injection 3 mL (has no administration in time range)  albuterol (PROVENTIL) (2.5 MG/3ML) 0.083% nebulizer solution 2.5 mg (has no administration in time range)  cefdinir (OMNICEF) capsule 300 mg (has no administration in time range)  furosemide (LASIX) tablet 40 mg (has no administration in time range)  ipratropium-albuterol (DUONEB) 0.5-2.5 (3) MG/3ML nebulizer solution 3 mL (has no administration in time range)  losartan (COZAAR) tablet 100 mg (has no administration in time range)  predniSONE (DELTASONE) tablet 10 mg (has no administration in time range)   stroke: mapping our early stages of recovery book (has no administration in time range)  0.9 %  sodium chloride infusion (has no  administration in time range)  senna-docusate (Senokot-S) tablet 1 tablet (has no administration in time range)  pantoprazole (PROTONIX) injection 40 mg (has no administration in time range)  nicardipine (CARDENE) 20mg  in 0.86% saline 224ml IV infusion (0.1 mg/ml) (has no administration in time range)  iopamidol (ISOVUE-370) 76 % injection 75 mL (75 mLs Intravenous Contrast Given 08/21/18 1259)  alteplase (ACTIVASE) 1 mg/mL infusion 74.7 mg (0 mg Intravenous Stopped 08/21/18 1344)     Initial Impression / Assessment and Plan / ED Course  I have reviewed the triage vital signs and the nursing notes.  Pertinent labs & imaging results that  were available during my care of the patient were reviewed by me and considered in my medical decision making (see chart for details).     MDM  Screen complete  Patient presenting for acute CVA.  Patient was immediately seen on arrival by the neuro team.  Patient has been given TPA.  Patient will be admitted to neurology service.   Final Clinical Impressions(s) / ED Diagnoses   Final diagnoses:  Cerebrovascular accident (CVA), unspecified mechanism [facility])    ED Discharge Orders    None       Valarie Merino, MD 08/21/18 1418

## 2018-08-21 NOTE — Code Documentation (Signed)
85 yo male coming from [facility] where is was noted to be at his baselin at 1130. Staff completed a bedbath and once they were finished, they reported patient was unable to speak and had right sided weakness. [facility] called and activated a Code Stroke. Stroke Team met patient upon arrival. Initial NIHSS 20 due to patient's inability to follow commands or answer questions. Severely dysarthric and aphasic. Right Visual deficits noted with right facial palsy, right sided arm and leg weakness, and patient is neglecting the right side. CT Head negative for hemorrhage. tPA started at 1244. CTA completed and showed M2 inferior division branch occlusion. Pt not an IR candidate due to mRS 4. Handoff given to [place], Therapist, sports.

## 2018-08-21 NOTE — H&P (Addendum)
Neurology history and physical    CC: Code stroke  History is obtained from: EMS  HPI: William Skinner is a 85 y.o. male with history of right leg pain, osteoarthritis, hypertension, hyperlipidemia and carotid artery occlusion status post CEA.  Patient resides at an assisted living facility.  At baseline patient is mostly bed ridden and uses a wheelchair, has neurocognitive decline, multiple fall risk, needs assistance with majority of activities of daily living.  At 1030 at the facility they were giving him a sponge bath, when they noted that he was nonverbal-had right facial droop-right arm flaccidity.  EMS was called and patient was immediately brought to [facility].  On CT scan an acute nonhemorrhagic left MCA infarct with hyperdense left M2 branch vessel was already observed.  Patient was within the TPA window and received full dose TPA.  Due to mRS of 4 he was not an IR candidate.  It was discussed with daughter-in-law about DNR/DNI.  At this time he is a full code however the son is power of attorney and once he arrives code status may be changed.  LKW: 10:30 AM on 08/21/2018 tpa given?: Yes as he was in the window for tPA with no contraindications Premorbid modified Rankin scale (mRS): 4 NIH stroke scale: 16   ROS: Unable to obtain due to altered mental status.   Past Medical History:  Diagnosis Date  . Back pain   . Carotid artery occlusion   . Hyperlipidemia   . Hypertension   . Primary osteoarthritis of knee    right  . Right leg pain     Family History  Problem Relation Age of Onset  . Diabetes Mellitus II Neg Hx    Social History:   reports that he quit smoking about 54 years ago. He has never used smokeless tobacco. He reports current alcohol use. He reports that he does not use drugs.  Medications  Current Facility-Administered Medications:  .  albuterol (PROVENTIL) (2.5 MG/3ML) 0.083% nebulizer solution 2.5 mg, 2.5 mg, Nebulization, Q4H PRN, Kerney Elbe, MD .   alteplase (ACTIVASE) 1 mg/mL infusion 74.7 mg, 0.9 mg/kg, Intravenous, Once, Kerney Elbe, MD .  cefdinir (OMNICEF) capsule 300 mg, 300 mg, Oral, Q12H, Kerney Elbe, MD .  furosemide (LASIX) tablet 40 mg, 40 mg, Oral, Daily, Kerney Elbe, MD .  ipratropium-albuterol (DUONEB) 0.5-2.5 (3) MG/3ML nebulizer solution 3 mL, 3 mL, Nebulization, BID, Kerney Elbe, MD .  losartan (COZAAR) tablet 100 mg, 100 mg, Oral, Daily, Kerney Elbe, MD .  Derrill Memo ON 08/22/2018] predniSONE (DELTASONE) tablet 10 mg, 10 mg, Oral, Q breakfast, Kerney Elbe, MD .  sodium chloride flush (NS) 0.9 % injection 3 mL, 3 mL, Intravenous, Once, [place]ick, Wallis Bamberg, MD  Current Outpatient Medications:  .  acetaminophen (TYLENOL) 500 MG tablet, Take 1,000 mg by mouth 3 (three) times daily as needed for moderate pain or headache. Reported on 07/29/2015, Disp: , Rfl:  .  albuterol (PROVENTIL HFA;VENTOLIN HFA) 108 (90 Base) MCG/ACT inhaler, Inhale 2 puffs into the lungs every 6 (six) hours as needed for wheezing or shortness of breath., Disp: , Rfl:  .  albuterol (PROVENTIL) (2.5 MG/3ML) 0.083% nebulizer solution, Take 2.5 mg by nebulization every 4 (four) hours as needed for wheezing or shortness of breath., Disp: , Rfl:  .  aspirin 81 MG tablet, Take 81 mg by mouth daily. , Disp: , Rfl:  .  cefdinir (OMNICEF) 300 MG capsule, Take 1 capsule (300 mg total) by mouth every 12 (twelve)  hours., Disp: 3 capsule, Rfl:  .  clobetasol cream (TEMOVATE) 2.45 %, Apply 1 application topically 2 (two) times daily. Apply daily or bid to Bilteral lower extremity rash. (Patient taking differently: Apply 1 application topically 2 (two) times daily as needed (Bilateral Lower Extremity Rash PRN). ), Disp: 30 g, Rfl: 0 .  docusate sodium (COLACE) 100 MG capsule, Take 1 capsule (100 mg total) by mouth 2 (two) times daily as needed for mild constipation., Disp: , Rfl:  .  feeding supplement, ENSURE ENLIVE, (ENSURE ENLIVE) LIQD, Take 237 mLs by mouth 2 (two)  times daily between meals., Disp: 237 mL, Rfl: 12 .  furosemide (LASIX) 40 MG tablet, Take 1 tablet (40 mg total) by mouth daily for 6 days., Disp: 30 tablet, Rfl:  .  ipratropium-albuterol (DUONEB) 0.5-2.5 (3) MG/3ML SOLN, Take 3 mLs by nebulization 2 (two) times daily., Disp: , Rfl:  .  loperamide (IMODIUM A-D) 2 MG capsule, Take 1 capsule (2 mg total) by mouth every 4 (four) hours as needed for diarrhea or loose stools., Disp: 30 capsule, Rfl: 0 .  losartan (COZAAR) 100 MG tablet, Take 100 mg by mouth daily., Disp: , Rfl:  .  Multiple Vitamin (MULTIVITAMIN WITH MINERALS) TABS tablet, Take 1 tablet by mouth daily., Disp: , Rfl:  .  ondansetron (ZOFRAN) 4 MG tablet, Take 1 tablet (4 mg total) by mouth every 8 (eight) hours as needed for nausea or vomiting. (Patient taking differently: Take 4 mg by mouth every 6 (six) hours as needed for nausea or vomiting. ), Disp: 40 tablet, Rfl: 0 .  predniSONE (DELTASONE) 10 MG tablet, Take 4 tablets (40 mg total) by mouth daily with breakfast for 3 days, THEN 3 tablets (30 mg total) daily with breakfast for 3 days, THEN 2 tablets (20 mg total) daily with breakfast for 3 days, THEN 1 tablet (10 mg total) daily with breakfast for 3 days., Disp: , Rfl:    Exam: Current vital signs: BP 125/68   Pulse 89   Temp 97.6 F (36.4 C) (Oral)   Resp 15   Ht 6' (1.829 m)   Wt 83 kg   SpO2 97%   BMI 24.82 kg/m  Vital signs in last 24 hours: Temp:  [97.6 F (36.4 C)] 97.6 F (36.4 C) (02/03 1257) Pulse Rate:  [89-93] 89 (02/03 1256) Resp:  [15] 15 (02/03 1256) BP: (108-137)/(68-83) 125/68 (02/03 1256) SpO2:  [90 %-97 %] 97 % (02/03 1256) Weight:  [83 kg] 83 kg (02/03 1200)  Physical Exam  Constitutional: Appears well-developed and well-nourished.  Psych: Affect appropriate to situation Eyes: No scleral injection HENT: No OP obstrucion Head: Normocephalic.  Cardiovascular: Normal rate and regular rhythm.  Respiratory: Effort normal, non-labored  breathing GI: Soft.  No distension. There is no tenderness.  Skin: WDI  Neuro: Mental Status: Patient is awake, globally aphasic with garbled nonsensical responses to questions; no discernible words.  Cranial Nerves: II: Complete right hemianopsia .  No blink to threat on the right III,IV, VI: Doll's response intact.  Pupils are equal, round, and reactive to light.   VII: Right facial droop VIII: Looks towards voice Motor: 5/5 strength on the left arm, left leg, right leg. There is 0/5 strength of the RUE Sensory: Withdraws to noxious stimuli bilaterally in the BLE and LUE; however does not withdraw to noxious with RUE Deep Tendon Reflexes: 2+ and symmetric in bilateral upper extremities. Patient would not relax his lower extremities for elicitation of reflexes Plantars: Toes are  downgoing bilaterally.  Cerebellar: Finger-nose on the left arm was difficult however there was no dysmetria. Slight tremor noted  Labs I have reviewed labs in epic and the results pertinent to this consultation are:  CBC    Component Value Date/Time   WBC 10.4 08/21/2018 1242   RBC 4.63 08/21/2018 1242   HGB 13.8 08/21/2018 1242   HCT 44.6 08/21/2018 1242   PLT 227 08/21/2018 1242   MCV 96.3 08/21/2018 1242   MCH 29.8 08/21/2018 1242   MCHC 30.9 08/21/2018 1242   RDW 13.2 08/21/2018 1242   LYMPHSABS 0.7 08/21/2018 1242   MONOABS 0.8 08/21/2018 1242   EOSABS 0.2 08/21/2018 1242   BASOSABS 0.1 08/21/2018 1242    CMP     Component Value Date/Time   NA 136 08/21/2018 1242   NA 140 06/21/2016   K 4.1 08/21/2018 1242   CL 100 08/21/2018 1242   CO2 25 08/21/2018 1242   GLUCOSE 152 (H) 08/21/2018 1242   BUN 37 (H) 08/21/2018 1242   BUN 32 (A) 06/21/2016   CREATININE 1.32 (H) 08/21/2018 1242   CALCIUM 8.8 (L) 08/21/2018 1242   PROT 5.9 (L) 08/21/2018 1242   ALBUMIN 3.2 (L) 08/21/2018 1242   AST 21 08/21/2018 1242   ALT 26 08/21/2018 1242   ALKPHOS 73 08/21/2018 1242   BILITOT 0.8  08/21/2018 1242   GFRNONAA 47 (L) 08/21/2018 1242   GFRAA 54 (L) 08/21/2018 1242    Lipid Panel  No results found for: CHOL, TRIG, HDL, CHOLHDL, VLDL, LDLCALC, LDLDIRECT   Imaging I have reviewed the images obtained:  CT-scan of the brain--acute nonhemorrhagic left MCA infarct with hyper density of the left M2 branch vessel  CTA of head neck-pending reading  MRI examination of the brain-pending  Etta Quill PA-C [facility] [phone] 08/21/2018, 1:12 PM   Assessment: 85 year old male with cerebral infarction due to thombus of left middle cerebral artery  Acuity: Acute Etiology: Thrombus of the left M2 branch Continue Evaluation:  -Admit to: ICU -Continue Aspirin/ Statin -Hold Aspirin until 24 hour post tPA. Can start if repeat neuroimaging at 24 hours is stable and without evidence of bleeding -Blood pressure control, goal of SYS < 180 post-tPA -MRI brain -ECHO -A1C/Lipid panel. -Hyperglycemia management per SSI to maintain glucose 140-180mg /dL. -PT/OT/ST therapies and recommendations when able  CNS -Close neuro monitoring   Hemiplegia and hemiparesis following cerebral infarction affecting right dominant side -PT/OT -PM&R consult  RESP Acute Respiratory Failure   CV -Aggressive BP control, goal SBP <180 -Titrate oral agents and IV   Hyperlipidemia, unspecified  - Statin for goal LDL < 70    ENDO -goal HgbA1c < 7   Prophylaxis DVT SCDs  Diet: NPO until cleared by speech  Code Status: Full Code  Comfort Measures DNR  60 minutes spent in the emergent neurological evaluation and management of this critically ill acute stroke patient  85 year old male presenting as a Code Stroke. Exam with dense aphasia. No hemorrhage on CT. tPA administered. Admitting to the ICU under the neurology service with post-tPA order set.  Electronically signed: Dr. Kerney Elbe

## 2018-08-21 NOTE — ED Notes (Signed)
TPA complete at 1344. IV infusion pump programmed to give 40mls 0.9% Sodium Chloride Infusion at a rate of 67.50mls/hour.

## 2018-08-21 NOTE — ED Provider Notes (Signed)
MSE was initiated and I personally evaluated the patient and placed orders (if any) at  12:24 PM on August 21, 2018.  The patient appears stable so that the remainder of the MSE may be completed by another provider.  Patient arrives by ambulance with a possible stroke activation.  Last known well was 11:30 AM when he was noticed by staff to have acute onset of difficulty speaking with right arm and right leg weakness.  He is able to handle his secretions.  He is on his way down to CT for emergent CT and neurology evaluation.   Hayden Rasmussen, MD 08/21/18 1225

## 2018-08-21 NOTE — Progress Notes (Signed)
Pharmacist Code Stroke Response  Notified to mix tPA at 1242 by Dr. Cheral Marker Delivered tPA to RN at 1244  tPA dose = 7.5mg  bolus over 1 minute followed by 67.2mg  for a total dose of 74.7mg  over 1 hour  Issues/delays encountered (if applicable): none   William Skinner, PharmD, BCPS Please check AMION for all [place] contact numbers Clinical Pharmacist 08/21/2018 1:10 PM

## 2018-08-21 NOTE — ED Triage Notes (Signed)
Patient BIB [facility] from [facility] with slurred speech and right sided weakness that started while CNAs were giving him a bath at 1130 this morning.

## 2018-08-22 ENCOUNTER — Other Ambulatory Visit (HOSPITAL_COMMUNITY): Payer: Medicare Other

## 2018-08-22 ENCOUNTER — Inpatient Hospital Stay (HOSPITAL_COMMUNITY): Payer: Medicare Other

## 2018-08-22 DIAGNOSIS — I6300 Cerebral infarction due to thrombosis of unspecified precerebral artery: Secondary | ICD-10-CM

## 2018-08-22 LAB — LIPID PANEL
Cholesterol: 157 mg/dL (ref 0–200)
HDL: 56 mg/dL (ref >40)
LDL Cholesterol: 89 mg/dL (ref 0–99)
Total CHOL/HDL Ratio: 2.8 RATIO
Triglycerides: 61 mg/dL (ref <150)
VLDL: 12 mg/dL (ref 0–40)

## 2018-08-22 LAB — HEMOGLOBIN A1C
Hgb A1c MFr Bld: 6.3 % — ABNORMAL HIGH (ref 4.8–5.6)
Mean Plasma Glucose: 134.11 mg/dL

## 2018-08-22 MED ORDER — BOOST / RESOURCE BREEZE PO LIQD CUSTOM
1.0000 | Freq: Two times a day (BID) | ORAL | Status: DC
  Administered 2018-08-22 – 2018-08-24 (×6): 1 via ORAL

## 2018-08-22 MED ORDER — RESOURCE 2.0 PO LIQD: 237.0000 mL | Freq: Two times a day (BID) | ORAL | Status: DC

## 2018-08-22 MED ORDER — ASPIRIN 325 MG PO TABS
325.0000 mg | ORAL_TABLET | Freq: Every day | ORAL | Status: DC
  Administered 2018-08-22 – 2018-08-24 (×3): 325 mg via ORAL
  Filled 2018-08-22 (×3): qty 1

## 2018-08-22 NOTE — Progress Notes (Signed)
Pt admitted to the unit as a transfer from 4N. Pt alert and verbally responsive; IV intact and transfusing; telemetry applied and verified with CCMD; second RN called to second verify. Pt skin intact with no pressure ulcers or opened wounds noted except for generalized ecchymosis mostly to RUE and some rash noted the the chest area. Pt left eye also noted to have some slight drainage. VSS; bed alarm on; call light within reach and will closely monitor pt. Delia Heady RN   08/22/18 1708  Vitals  Temp 98.7 F (37.1 C)  Temp Source Oral  BP 109/67  MAP (mmHg) 80  BP Method Automatic  Pulse Rate (!) 106  Pulse Rate Source Monitor  Resp 20  Oxygen Therapy  SpO2 98 %  O2 Device Nasal Cannula  O2 Flow Rate (L/min) 4 L/min

## 2018-08-22 NOTE — Progress Notes (Signed)
SLP Cancellation Note  Patient Details Name: William Skinner MRN: [id number] DOB: Aug 10, 1925   Cancelled treatment:       Reason Eval/Treat Not Completed: Patient at procedure or test/unavailable Staff just arrived to transport pt to MRI. SLP will follow up.    Horton Marshall 08/22/2018, 9:25 AM

## 2018-08-22 NOTE — Progress Notes (Signed)
Initial Nutrition Assessment  DOCUMENTATION CODES:   Not applicable  INTERVENTION:   Continue Boost Breeze po TID, each supplement provides 250 kcal and 9 grams of protein   NUTRITION DIAGNOSIS:   Inadequate oral intake related to dysphagia(stroke) as evidenced by meal completion < 50%.  GOAL:   Patient will meet greater than or equal to 90% of their needs  MONITOR:   PO intake, Supplement acceptance  REASON FOR ASSESSMENT:   Malnutrition Screening Tool    ASSESSMENT:   Pt with PMH of HTN, HLD, carotid artery occlusion s/p CEA, Jan 2020 admission for PNA who resides at a ALF (at SNF x 1 week PTA) is mostly bedridden and uses a wheelchair and needs assistance with ADLs now admitted with L MCA infarct s/p tPA.    Spoke with RN, OT, and patient. Patient unable to answer any history questions.  Per chart review and previous RD notes pt usual body weight was 200 lb but was down to 191 lb Jan 2020 admission with PNA. He then went to a SNF and was listed as mostly bed/wheelchair bound and needed help with most ADL's. His weight this admission was listed as 183 lb but unsure of accuracy.  Per OT pt was able to feed himself with his left hand and did well until there was less food on his plate, he then needed help. Per meal completion records pt able to eat 50% of his Regular diet last pm.  Pt unable to answer questions about weight and appetite.   Medications reviewed and include: lasix 40 mg daily, 10 mg prednisone daily Labs reviewed    NUTRITION - FOCUSED PHYSICAL EXAM:    Most Recent Value  Orbital Region  No depletion  Upper Arm Region  Mild depletion  Thoracic and Lumbar Region  No depletion  Buccal Region  No depletion  Temple Region  No depletion  Clavicle Bone Region  No depletion  Clavicle and Acromion Bone Region  No depletion  Scapular Bone Region  No depletion  Dorsal Hand  Mild depletion  Patellar Region  Mild depletion  Anterior Thigh Region  Mild  depletion  Posterior Calf Region  Mild depletion  Edema (RD Assessment)  None  Hair  Reviewed  Eyes  Reviewed  Mouth  Reviewed  Skin  Reviewed  Nails  Reviewed       Diet Order:   Diet Order            Diet regular Room service appropriate? Yes; Fluid consistency: Thin  Diet effective now              EDUCATION NEEDS:   No education needs have been identified at this time  Skin:  Skin Assessment: Reviewed RN Assessment(ecchymosis and MASD (groin))  Last BM:  2/2  Height:   Ht Readings from Last 1 Encounters:  08/21/18 6' (1.829 m)    Weight:   Wt Readings from Last 1 Encounters:  08/21/18 83 kg    Ideal Body Weight:  80.9 kg  BMI:  Body mass index is 24.82 kg/m.  Estimated Nutritional Needs:   Kcal:  6606-0045  Protein:  80-90 grams  Fluid:  > 1.7 L/day  Maylon Peppers RD, LDN, CNSC [phone] Pager [phone] After Hours Pager

## 2018-08-22 NOTE — Evaluation (Signed)
Speech Language Pathology Evaluation Patient Details Name: William Skinner MRN: [id number] DOB: October 11, 1925 Today's Date: 08/22/2018 Time: 7846-9629 SLP Time Calculation (min) (ACUTE ONLY): 20 min  Problem List:  Patient Active Problem List   Diagnosis Date Noted  . Stroke (cerebrum) ([place]) 08/21/2018  . Acute respiratory failure ([place]) 08/06/2018  . Generalized weakness 08/06/2018  . Diarrhea of presumed infectious origin 08/06/2018  . Community acquired pneumonia 08/06/2018  . H/O carotid endarterectomy 06/01/2016  . Primary osteoarthritis of right knee 05/31/2016  . Essential hypertension 05/31/2016  . H/O spinal stenosis 05/31/2016  . Pain in joint, lower leg 03/06/2014   Past Medical History:  Past Medical History:  Diagnosis Date  . Back pain   . Carotid artery occlusion   . Hyperlipidemia   . Hypertension   . Primary osteoarthritis of knee    right  . Right leg pain    Past Surgical History:  Past Surgical History:  Procedure Laterality Date  . CAROTID ENDARTERECTOMY Left   . CATARACT EXTRACTION Bilateral   . COLONOSCOPY W/ BIOPSIES AND POLYPECTOMY    . HERNIA REPAIR    . KNEE SURGERY Left   . NOSE SURGERY    . TONSILLECTOMY    . TOTAL KNEE ARTHROPLASTY Right 06/15/2016   Procedure: RIGHT TOTAL KNEE ARTHROPLASTY;  Surgeon: Renette Butters, MD;  Location: [facility];  Service: Orthopedics;  Laterality: Right;   HPI:  Pt is a 85 y.o. male with history of right leg pain, osteoarthritis, hypertension, hyperlipidemia and carotid artery occlusion status post CEA.  Patient resides at an assisted living facility.  At baseline the pt is mostly bed ridden and uses a wheelchair, has neurocognitive decline, multiple fall risk, needs assistance with majority of activities of daily living.  At 1030 on 08/21/18 he was having a sponge bath, when staff noted that he was nonverbal, had right facial droop, and right arm flaccidity. On CT scan an acute nonhemorrhagic left MCA infarct with  hyperdense left M2 branch vessel was already observed.  Patient was within the TPA window and received full dose TPA. The MRI of 08/22/18 revealed Acute infarct left parietal lobe in the posterior left MCA territory. No associated hemorrhage. In addition, scattered small areas of acute infarct in the right occipital and right parietal lobe, raising the possibility of emboli.   Assessment / Plan / Recommendation Clinical Impression  Pt participated in speech/language evaluation. He was alert and cooperative throughout the evaluation but exhibited symptoms of frustration and responded, "I don't know" to many questions following difficulty responding. Pt presents with moderate-severe fluent aphasia characterized by fluent but often meaningless speech and impairments in auditory comprehension. He was able to follow 1-step commands with 40% accuracy and 20% accuracy with simple yes/no questions but his level of accuracy reduced to 0% as complexity increased. Expessively, he was able to complete a single automatic sequence (i.e., counting) and inconsistently produced spontaneous speech (e.g., "I don't know", "I don't know about that", and " we got the". However, demonstrated 0% accuracy with confrontational naming, responsive naming, and sentence completion. Pt exhibited paraphasias (phonemic and neologistic) and perseveration was demonstrated throughout the evaluation. Paraphasias persisted despite the use of cues and increased frustration was noted when cues were provided. Skilled SLP services are clinically indicated at this time for aphasia intervention.     SLP Assessment  SLP Recommendation/Assessment: Patient needs continued Speech Lanaguage Pathology Services SLP Visit Diagnosis: Aphasia (R47.01)    Follow Up Recommendations  Skilled Nursing facility  Frequency and Duration min 2x/week  2 weeks      SLP Evaluation Cognition  Overall Cognitive Status: Difficult to assess(Due to  aphasia) Arousal/Alertness: Awake/alert Orientation Level: Oriented to person;Disoriented to place;Disoriented to time;Disoriented to situation       Comprehension  Auditory Comprehension Overall Auditory Comprehension: Impaired Yes/No Questions: Impaired Other Yes/No Questions Comments`: (Simple: 1/5; Complex 0/5) Commands: Impaired One Step Basic Commands: (2/5) Two Step Basic Commands: (0/5) Conversation: Simple EffectiveTechniques: Extra processing time;Repetition;Slowed speech;Visual/Gestural cues Reading Comprehension Reading Status: Not tested    Expression Expression Primary Mode of Expression: Verbal Verbal Expression Overall Verbal Expression: Impaired Initiation: Impaired Automatic Speech: Counting;Day of week(Counting: 7/10 with mod-max cues; Days: 0/7) Level of Generative/Spontaneous Verbalization: Phrase Repetition: Impaired Level of Impairment: Phrase level Naming: Impairment Responsive: (0/5) Confrontation: (0/10) Verbal Errors: Phonemic paraphasias;Neologisms;Perseveration;Other (comment)(Inconsistently aware of errors) Pragmatics: No impairment Effective Techniques: Phonemic cues Written Expression Dominant Hand: Right   Oral / Motor  Motor Speech Overall Motor Speech: Appears within functional limits for tasks assessed Respiration: Within functional limits Resonance: Within functional limits Articulation: Within functional limitis Intelligibility: Intelligible   William Skinner I. Hardin Negus, [place], [place] Office number [phone] Pager [phone]                   Horton Marshall 08/22/2018, 5:09 PM

## 2018-08-22 NOTE — Progress Notes (Signed)
Report given to RN on 3W. Family notified of patient's transfer. No belongings at the bedside. Oreta Soloway, Rande Brunt, RN

## 2018-08-22 NOTE — Progress Notes (Addendum)
OT evaluation Clinical Impression:  PTA patient requires most assist for ADLs and total assist for transfers, although able to self feed and complete some grooming per son/RN report.  Patient currently admitted for below and limited by impaired balance, R inattention and visual impairment, R UE weakness and impaired sensation, impaired cognition and aphasia.  Patient requires min assist to self feed using L UE, mod assist for simple grooming tasks (washing face/hands) but requires cueing for sequencing and body part identification, and baseline total assist for bathing/dressing.  Patient will benefit from continued OT services while admitted and after dc at SNF in order to optimize return to [facility] with self care.      08/22/18 1300  OT Visit Information  Last OT Received On 08/22/18  Assistance Needed +2  History of Present Illness Pt is a 85 y/o male with history of right leg pain, osteoarthritis, hypertension, hyperlipidemia and carotid artery occlusion status post CEA. Noted recent admission to [facility] and dc to SNF d/t pneumonia.  Found to be nonverbal, R facial and R HIP during his bath at SNF.  Recieved IV tPA 08/21/2018 at 1244. CT reveals acute L MCA infarct, MRI pending. Cleared for mobility by neurology.   Precautions  Precautions Fall  Restrictions  Weight Bearing Restrictions No  Home Living  Family/patient expects to be discharged to: [place] - manual  Additional Comments resides at SNF- [facility]   Prior Function  Level of Independence Needs assistance  Gait / Transfers Assistance Needed pt non-ambulatory PTA, total assist for transfers to wc per son   ADL's / [place] pt requires total assist for self care (bathing, dressing, toileting), but able to self feed per son   Communication  Communication Expressive difficulties;Receptive difficulties  Pain Assessment  Pain Assessment No/denies pain  Cognition   Arousal/Alertness Awake/alert  Behavior During Therapy WFL for tasks assessed/performed  Overall Cognitive Status Difficult to assess  Area of Impairment Orientation;Attention;Following commands;Memory;Safety/judgement;Awareness  Orientation Level Disoriented to;Place;Time  Current Attention Level Focused  Memory Decreased short-term memory  Following Commands Follows one step commands inconsistently;Follows one step commands with increased time  Safety/Judgement Decreased awareness of deficits  Awareness Intellectual  General Comments pt able to follow 1 step commands inconsistently, decreased awareness to R side; limited due to aphasia   Difficult to assess due to Impaired communication  Upper Extremity Assessment  Upper Extremity Assessment RUE deficits/detail;LUE deficits/detail  RUE Deficits / Details gorssly 3-/5 MMT, decreased coordination and sensation   RUE Sensation decreased light touch;decreased proprioception  RUE Coordination decreased fine motor;decreased gross motor  LUE Deficits / Details generalized weakness, grossly 3+/5   Lower Extremity Assessment  Lower Extremity Assessment Defer to PT evaluation  ADL  Overall ADL's  Needs assistance/impaired  Eating/Feeding Details (indicate cue type and reason) per son, pt used L hand to self feed with minimal assist   Grooming Wash/dry hands;Wash/dry face;Mod assist;Sitting  Grooming Details (indicate cue type and reason) cueing to sequence tasks, multimodal cueing to follow commands to wash hands (initally washing face, then legs)  Upper Body Bathing Sitting;Maximal assistance  Lower Body Bathing Total assistance;Sitting/lateral leans  Upper Body Dressing  Total assistance;Sitting  Lower Body Dressing Total assistance;Bed level  Toilet Transfer Details (indicate cue type and reason) deferred   General ADL Comments pt seated in recliner, and deffered mobility as patient wc bound at baseline; baseline requires total assist for  bathing and dressing but limited with grooming and eating  by cognition and vision today   Vision- History  Baseline Vision/History No visual deficits (per son )  Patient Visual Report No change from baseline  Vision- Assessment  Vision Assessment? Yes  Additional Comments attempted visual assessment, unable to follow commands to complete visual assessment but functionally noted R visual field deficit   Bed Mobility  General bed mobility comments OOB in recliner upon entry  Transfers  General transfer comment deferred   Balance  Overall balance assessment History of Falls  Sitting-balance support No upper extremity supported;Feet supported  Sitting balance-Leahy Scale Fair  General Comments  General comments (skin integrity, edema, etc.) educated son on positioning of R UE, engaging with patient on R side to increase awareness   OT - End of Session  Equipment Utilized During Treatment Oxygen  Activity Tolerance Patient tolerated treatment well  Patient left in chair;with call bell/phone within reach;with chair alarm set;with family/visitor present  Nurse Communication Mobility status  OT Assessment  OT Recommendation/Assessment Patient needs continued OT Services  OT Visit Diagnosis Muscle weakness (generalized) (M62.81);Other symptoms and signs involving cognitive function;Other symptoms and signs involving the nervous system (R29.898)  OT Problem List Decreased strength;Decreased activity tolerance;Impaired balance (sitting and/or standing);Impaired vision/perception;Decreased coordination;Decreased cognition;Decreased safety awareness;Decreased knowledge of use of DME or AE;Decreased knowledge of precautions;Impaired sensation  OT Plan  OT Frequency (ACUTE ONLY) Min 2X/week  OT Treatment/Interventions (ACUTE ONLY) Self-care/ADL training;Therapeutic exercise;Neuromuscular education;Therapeutic activities;Patient/family education;Balance training;Cognitive  remediation/compensation;Visual/perceptual remediation/compensation  AM-PAC OT "6 Clicks" Daily Activity Outcome Measure (Version 2)  Help from another person eating meals? 3  Help from another person taking care of personal grooming? 2  Help from another person toileting, which includes using toliet, bedpan, or urinal? 1  Help from another person bathing (including washing, rinsing, drying)? 1  Help from another person to put on and taking off regular upper body clothing? 1  Help from another person to put on and taking off regular lower body clothing? 1  6 Click Score 9  OT Recommendation  Follow Up Recommendations SNF  OT Equipment None recommended by OT  Individuals Consulted  Consulted and Agree with Results and Recommendations Family member/caregiver  Family Member Consulted son  Acute Rehab OT Goals  Patient Stated Goal none stated  Time For Goal Achievement 09/05/18  Potential to Achieve Goals Good  OT Time Calculation  OT Start Time (ACUTE ONLY) 1314  OT Stop Time (ACUTE ONLY) 1336  OT Time Calculation (min) 22 min  OT General Charges  $OT Visit 1 Visit  OT Evaluation  $OT Eval Moderate Complexity 1 Mod  Written Expression  Dominant Hand Right    Delight Stare, OT Acute Rehabilitation Services Pager [phone] Office [phone]

## 2018-08-22 NOTE — Evaluation (Signed)
Physical Therapy Evaluation Patient Details Name: William Skinner MRN: [id number] DOB: 1925-08-17 Today's Date: 08/22/2018   History of Present Illness  Pt is a 85 y/o male with history of right leg pain, osteoarthritis, hypertension, hyperlipidemia and carotid artery occlusion status post CEA. Noted recent admission to [facility] and dc to SNF d/t pneumonia.  Found to be nonverbal, R facial and R HIP during his bath at SNF.  Recieved IV tPA 08/21/2018 at 1244. CT reveals acute L MCA infarct, MRI pending. Cleared for mobility by neurology.   Clinical Impression  Pt admitted with above. Pt was w/c bound prior to admit however pt was able to sit EOB without difficulty PTA. Pt now with R sided weakness, R sided neglect, and significant R sided lean sitting EOB requiring total assist to transfer to the chair. Pt will need SNF upon d/c for maximal functional return. Acute PT to cont to follow.    Follow Up Recommendations SNF    Equipment Recommendations  None recommended by PT    Recommendations for Other Services       Precautions / Restrictions Precautions Precautions: Fall Precaution Comments: R sided neglect Restrictions Weight Bearing Restrictions: No      Mobility  Bed Mobility Overal bed mobility: Needs Assistance Bed Mobility: Supine to Sit     Supine to sit: Mod assist     General bed mobility comments: pt initiated task, modA for trunk elevation, pt able to bring LEs off EOB  Transfers Overall transfer level: Needs assistance Equipment used: 2 person hand held assist(2 person lift with gait belt and bed pad) Transfers: Squat Pivot Transfers     Squat pivot transfers: Total assist;+2 physical assistance     General transfer comment: pt with R lateral lean, pt initiated stand but very weak and unable to power up, unable to use R UE functionally  Ambulation/Gait             General Gait Details: non-ambulatory at baseline for the last 2 months  Stairs             Wheelchair Mobility    Modified Rankin (Stroke Patients Only) Modified Rankin (Stroke Patients Only) Pre-Morbid Rankin Score: Moderately severe disability Modified Rankin: Severe disability     Balance Overall balance assessment: History of Falls;Needs assistance(multiple recent falls (at least 2-3 in last 2wks), has fallen from EOB) Sitting-balance support: Feet supported;Bilateral upper extremity supported Sitting balance-Leahy Scale: Fair Sitting balance - Comments: pt with strong R lateral lean and inability to self-correct to midline Postural control: Right lateral lean Standing balance support: During functional activity   Standing balance comment: pt dependent on physical assist to stand                             Pertinent Vitals/Pain Pain Assessment: No/denies pain    Home Living Family/patient expects to be discharged to:: Skilled nursing facility(per chart ALF, however also reports w/c bound)               Home Equipment: Wheelchair - manual Additional Comments: resides at SNF     Prior Function Level of Independence: Needs assistance   Gait / Transfers Assistance Needed: pt non-ambulatory PTA, total assist for transfers to wc per son   ADL's / Homemaking Assistance Needed: pt requires total assist for self care (bathing, dressing, toileting), but able to self feed per son   Comments: per family pt was walking up until the  last couple of months     Hand Dominance   Dominant Hand: Right    Extremity/Trunk Assessment   Upper Extremity Assessment Upper Extremity Assessment: Defer to OT evaluation(however pt with minimal active R UE mvmt) RUE Deficits / Details: gorssly 3-/5 MMT, decreased coordination and sensation  RUE Sensation: decreased light touch;decreased proprioception RUE Coordination: decreased fine motor;decreased gross motor LUE Deficits / Details: generalized weakness, grossly 3+/5     Lower Extremity Assessment Lower  Extremity Assessment: Generalized weakness(R LE weaker than the L)    Cervical / Trunk Assessment Cervical / Trunk Assessment: Kyphotic  Communication   Communication: Expressive difficulties;Receptive difficulties  Cognition Arousal/Alertness: Awake/alert Behavior During Therapy: WFL for tasks assessed/performed Overall Cognitive Status: Impaired/Different from baseline Area of Impairment: Orientation;Attention;Following commands;Memory;Safety/judgement;Awareness                 Orientation Level: Disoriented to;Place;Time(able to state name but very confused) Current Attention Level: Focused Memory: Decreased short-term memory Following Commands: Follows one step commands inconsistently;Follows one step commands with increased time Safety/Judgement: Decreased awareness of deficits(R sided neglect) Awareness: Intellectual   General Comments: pt HOH and with expressive aphasia, pt with impaired comprehension as well      General Comments General comments (skin integrity, edema, etc.): pt with bruising t/o bilat UEs, especially R UE due to impaired sensation    Exercises     Assessment/Plan    PT Assessment Patient needs continued PT services  PT Problem List Decreased strength;Decreased mobility;Decreased activity tolerance;Decreased balance;Decreased knowledge of use of DME       PT Treatment Interventions DME instruction;Functional mobility training;Patient/family education;Gait training;Therapeutic activities;Therapeutic exercise    PT Goals (Current goals can be found in the Care Plan section)  Acute Rehab PT Goals Patient Stated Goal: didn't state PT Goal Formulation: With patient Time For Goal Achievement: 09/04/18 Potential to Achieve Goals: Good    Frequency Min 3X/week   Barriers to discharge        Co-evaluation               AM-PAC PT "6 Clicks" Mobility  Outcome Measure Help needed turning from your back to your side while in a flat bed  without using bedrails?: A Lot Help needed moving from lying on your back to sitting on the side of a flat bed without using bedrails?: A Lot Help needed moving to and from a bed to a chair (including a wheelchair)?: Total Help needed standing up from a chair using your arms (e.g., wheelchair or bedside chair)?: Total Help needed to walk in hospital room?: Total Help needed climbing 3-5 steps with a railing? : Total 6 Click Score: 8    End of Session Equipment Utilized During Treatment: Gait belt Activity Tolerance: Patient limited by fatigue Patient left: with call bell/phone within reach;in chair;with chair alarm set Nurse Communication: Mobility status PT Visit Diagnosis: Muscle weakness (generalized) (M62.81);Repeated falls (R29.6)    Time: 1133-1202 PT Time Calculation (min) (ACUTE ONLY): 29 min   Charges:   PT Evaluation $PT Eval Moderate Complexity: 1 Mod PT Treatments $Neuromuscular Re-education: 8-22 mins        Kittie Plater, PT, DPT Acute Rehabilitation Services Pager #: [phone] Office #: [phone]   Berline Lopes 08/22/2018, 2:21 PM

## 2018-08-22 NOTE — Progress Notes (Addendum)
STROKE TEAM PROGRESS NOTE   INTERVAL HISTORY His daughter, who has worked for the [employer] x 42 years and is a Therapist, sports at [employer] is at the bedside.  He has lived in an ALF for several years. Recently went to SNF following hospitalization for PNA - has been there x 1 week.   Vitals:   08/22/18 0600 08/22/18 0700 08/22/18 0800 08/22/18 0822  BP: 134/71 111/67 (!) 100/52   Pulse: 90 90 83   Resp: 15 17 20    Temp:      TempSrc:      SpO2: 90% 93% 98% 100%  Weight:      Height:        CBC:  Recent Labs  Lab 08/21/18 1242  WBC 10.4  NEUTROABS 8.6*  HGB 13.8  HCT 44.6  MCV 96.3  PLT 119    Basic Metabolic Panel:  Recent Labs  Lab 08/21/18 1232 08/21/18 1242  NA  --  136  K  --  4.1  CL  --  100  CO2  --  25  GLUCOSE  --  152*  BUN  --  37*  CREATININE 1.30* 1.32*  CALCIUM  --  8.8*   Lipid Panel:     Component Value Date/Time   CHOL 157 08/22/2018 0455   TRIG 61 08/22/2018 0455   HDL 56 08/22/2018 0455   CHOLHDL 2.8 08/22/2018 0455   VLDL 12 08/22/2018 0455   LDLCALC 89 08/22/2018 0455   HgbA1c:  Lab Results  Component Value Date   HGBA1C 6.3 (H) 08/22/2018   Urine Drug Screen: No results found for: LABOPIA, COCAINSCRNUR, LABBENZ, AMPHETMU, THCU, LABBARB  Alcohol Level No results found for: ETH  IMAGING Ct Angio Head W Or Wo Contrast  Result Date: 08/21/2018 CLINICAL DATA:  Acute onset right arm and leg weakness and difficulty speaking. EXAM: CT ANGIOGRAPHY HEAD AND NECK TECHNIQUE: Multidetector CT imaging of the head and neck was performed using the standard protocol during bolus administration of intravenous contrast. Multiplanar CT image reconstructions and MIPs were obtained to evaluate the vascular anatomy. Carotid stenosis measurements (when applicable) are obtained utilizing NASCET criteria, using the distal internal carotid diameter as the denominator. CONTRAST:  87mL ISOVUE-370 IOPAMIDOL (ISOVUE-370) INJECTION 76% COMPARISON:  None. FINDINGS: CTA NECK FINDINGS  Aortic arch: Standard 3 vessel aortic arch. Prominent irregular soft plaque in the aortic arch. No arch vessel origin stenosis. Right carotid system: Patent with predominantly calcified plaque at the carotid bifurcation and in the proximal ICA without significant stenosis. Mild beading of the distal cervical ICA. Left carotid system: Patent with evidence of prior endarterectomy. No significant stenosis or dissection. Vertebral arteries: Patent with the right being mildly dominant. No significant stenosis or dissection. Skeleton: Moderate to severe disc and asymmetric left-sided facet arthrosis in the cervical spine. Other neck: No evidence of acute abnormality or mass. Upper chest: Mild biapical pleuroparenchymal scarring. Review of the MIP images confirms the above findings CTA HEAD FINDINGS Anterior circulation: The internal carotid arteries are patent from skull base to carotid termini with mild-to-moderate atherosclerotic plaque bilaterally not resulting in significant stenosis. The right A1 segment is absent. The left A1 and bilateral M1 segments are widely patent. There is proximal to mid left M2 inferior division occlusion with mild distal collateralization. No aneurysm is identified. Posterior circulation: The intracranial vertebral arteries are patent to the basilar with mild calcified plaque on the right not resulting in significant stenosis. Patent left PICA, right AICA, and bilateral SCA origins are  identified. The basilar artery is widely patent. PCAs are patent with atherosclerotic irregularity bilaterally most notably involving the branch vessels without evidence of flow limiting proximal stenosis. No aneurysm is identified. Venous sinuses: As permitted by contrast timing, patent. Anatomic variants: Absent right A1. Review of the MIP images confirms the above findings IMPRESSION: 1. Proximal to mid left M2 inferior division branch occlusion. 2. Intracranial atherosclerosis without significant proximal  stenosis. 3. Patent cervical carotid and vertebral arteries without significant stenosis. 4.  Aortic Atherosclerosis (ICD10-I70.0). These results were communicated to Dr. Cheral Marker at 12:59 pm on 08/21/2018 by text page via the [facility] messaging system. Electronically Signed   By: Logan Bores M.D.   On: 08/21/2018 13:16   Ct Angio Neck W Or Wo Contrast  Result Date: 08/21/2018 CLINICAL DATA:  Acute onset right arm and leg weakness and difficulty speaking. EXAM: CT ANGIOGRAPHY HEAD AND NECK TECHNIQUE: Multidetector CT imaging of the head and neck was performed using the standard protocol during bolus administration of intravenous contrast. Multiplanar CT image reconstructions and MIPs were obtained to evaluate the vascular anatomy. Carotid stenosis measurements (when applicable) are obtained utilizing NASCET criteria, using the distal internal carotid diameter as the denominator. CONTRAST:  39mL ISOVUE-370 IOPAMIDOL (ISOVUE-370) INJECTION 76% COMPARISON:  None. FINDINGS: CTA NECK FINDINGS Aortic arch: Standard 3 vessel aortic arch. Prominent irregular soft plaque in the aortic arch. No arch vessel origin stenosis. Right carotid system: Patent with predominantly calcified plaque at the carotid bifurcation and in the proximal ICA without significant stenosis. Mild beading of the distal cervical ICA. Left carotid system: Patent with evidence of prior endarterectomy. No significant stenosis or dissection. Vertebral arteries: Patent with the right being mildly dominant. No significant stenosis or dissection. Skeleton: Moderate to severe disc and asymmetric left-sided facet arthrosis in the cervical spine. Other neck: No evidence of acute abnormality or mass. Upper chest: Mild biapical pleuroparenchymal scarring. Review of the MIP images confirms the above findings CTA HEAD FINDINGS Anterior circulation: The internal carotid arteries are patent from skull base to carotid termini with mild-to-moderate atherosclerotic plaque  bilaterally not resulting in significant stenosis. The right A1 segment is absent. The left A1 and bilateral M1 segments are widely patent. There is proximal to mid left M2 inferior division occlusion with mild distal collateralization. No aneurysm is identified. Posterior circulation: The intracranial vertebral arteries are patent to the basilar with mild calcified plaque on the right not resulting in significant stenosis. Patent left PICA, right AICA, and bilateral SCA origins are identified. The basilar artery is widely patent. PCAs are patent with atherosclerotic irregularity bilaterally most notably involving the branch vessels without evidence of flow limiting proximal stenosis. No aneurysm is identified. Venous sinuses: As permitted by contrast timing, patent. Anatomic variants: Absent right A1. Review of the MIP images confirms the above findings IMPRESSION: 1. Proximal to mid left M2 inferior division branch occlusion. 2. Intracranial atherosclerosis without significant proximal stenosis. 3. Patent cervical carotid and vertebral arteries without significant stenosis. 4.  Aortic Atherosclerosis (ICD10-I70.0). These results were communicated to Dr. Cheral Marker at 12:59 pm on 08/21/2018 by text page via the [facility] messaging system. Electronically Signed   By: Logan Bores M.D.   On: 08/21/2018 13:16   Dg Chest Port 1 View  Result Date: 08/21/2018 CLINICAL DATA:  From nursing home with altered level of consciousness. EXAM: PORTABLE CHEST 1 VIEW COMPARISON:  08/06/2018 chest radiograph FINDINGS: Stable cardiac silhouette within normal limits given projection and technique. Aortic atherosclerosis with calcification. Stable small opacity within  the lingula, probably scarring or atelectasis. No new consolidation. No pleural effusion or pneumothorax. No acute osseous abnormality is evident. IMPRESSION: No active disease. Stable small opacity within the lingula probably scarring or atelectasis. Aortic Atherosclerosis  (ICD10-I70.0). Electronically Signed   By: Kristine Garbe M.D.   On: 08/21/2018 13:37   Ct Head Code Stroke Wo Contrast`  Result Date: 08/21/2018 CLINICAL DATA:  Code stroke. Right arm and leg weakness and difficulty speaking. EXAM: CT HEAD WITHOUT CONTRAST TECHNIQUE: Contiguous axial images were obtained from the base of the skull through the vertex without intravenous contrast. COMPARISON:  None. FINDINGS: Brain: There is loss of gray-white differentiation involving the left parietal lobe and posterior left frontal lobe consistent with acute infarct. There may also be involvement of the posterior left insula and adjacent posterior left temporal lobe. Lacunar infarcts at the anterior aspect of the left basal ganglia and in the left thalamus are likely chronic. Patchy to confluent cerebral white matter hypodensities bilaterally are nonspecific but compatible with moderately advanced chronic small vessel ischemic disease. There is also moderate cerebral atrophy. No intracranial hemorrhage, mass, midline shift, or extra-axial fluid collection is identified. Vascular: Hyperdense left MCA branch in the sylvian fissure. Calcified atherosclerosis at the skull base. Skull: No fracture or focal osseous lesion. Sinuses/Orbits: Postsurgical changes in the paranasal sinuses with mild scattered mucosal thickening. Trace right mastoid effusion. Bilateral cataract extraction. Other: None. ASPECTS [facility] Stroke Program Early CT Score) - Ganglionic level infarction (caudate, lentiform nuclei, internal capsule, insula, M1-M3 cortex): 6 - Supraganglionic infarction (M4-M6 cortex): 1 Total score (0-10 with 10 being normal): 7 IMPRESSION: 1. Acute nonhemorrhagic left MCA infarct with hyperdense left M2 branch vessel. 2. ASPECTS is 7. 3. Moderate chronic small vessel ischemic disease and cerebral atrophy. These results were communicated to Dr. Cheral Marker at 12:59 pm on 08/21/2018 by text page via the [facility] messaging system.  Electronically Signed   By: Logan Bores M.D.   On: 08/21/2018 13:03    PHYSICAL EXAM Obese elderly caucasian male not in distress. . Afebrile. Head is nontraumatic. Neck is supple without bruit.    Cardiac exam no murmur or gallop. Lungs are clear to auscultation. Distal pulses are well felt.  Neurological Exam :  Awake alert expressive greater than receptive aphasia with nonfluent speech and significant word hesitancy. Follows simple midline and one-step commands only. Difficulty with naming and repetition. Mild dysarthria. Eye movements are full range. Right homonymous hemianopsia. Mild right lower facial weakness. Tongue midline. Motor system exam shows mild right hemiparesis with 4/5 strength with weakness of right grip and intrinsic hand muscles. Gait deferred ASSESSMENT/PLAN [name] is a 85 y.o. male with history of right leg pain, osteoarthritis, hypertension, hyperlipidemia and carotid artery occlusion status post CEA during his bath found to be nonverbal, with R facial and R HIP. Received IV tPA 08/21/2017 at 1244.   Stroke:  left brain infarct s/p IV tPA, infarct likely embolic secondary to unknown source  Code Stroke CT head acute L MCA infarct w/ hyperdense L M2.  Small vessel disease. Atrophy. ASPECTS 7    CTA head & neck prox L M2 branch occlusion. IC atherosclerosis. Aortic atherosclerosis.   MRI  pending   2D Echo (08/07/2018)  EF 55-60%. No source of embolus   LDL 89  HgbA1c 6.3  SCDs for VTE prophylaxis  aspirin 81 mg daily prior to admission, now on No antithrombotic as within 24h of tPA administration. Plan aspirin. Start if imaging neg for hmg  proir to 12 mid.  Therapy recommendations:  pending    Disposition:  pending  (from ALF for past few years, at [facility] for 1 week following a bout of PNA)   Hypertension  Home meds: lasix 40, cozaar 100  BP now low normal  BP goal per post tPA orders x 24h . Ok to reume home BP meds per Dr.  Leonie Man . Long-term BP goal normotensive  Hyperlipidemia  Home meds:  No statin  LDL 89, goal < 70  Add lipitor 10  Continue statin at discharge  Other Stroke Risk Factors  Advanced age  Former Cigarette smoker, quit 34 yrs ago  Carotid artery stenosis s/p CEA  Other Active Problems  Recent PNA  Elevated HgbA1c - ? Related to recent tx w/ Prednisone. F/u as OP  Pt is a DNR at his facility. Daughter wishes for him to be a limited DNR here with no compressions, no intubation. Otherwise, treat. Order placed. RN to ask her to bring form for transport to facility at time of d/c.  Hospital day # 1  Burnetta Sabin, MSN, APRN, ANVP-BC, AGPCNP-BC Advanced Practice Stroke Nurse [place] for Schedule & Pager information 08/22/2018 9:55 AM  I have personally obtained history,examined this patient, reviewed notes, independently viewed imaging studies, participated in medical decision making and plan of care.ROS completed by me personally and pertinent positives fully documented  I have made any additions or clarifications directly to the above note. Agree with note above.  He presented with aphasia and right hemiplegia due to left M 2 clot and received IV tPA but was not considered for thrombectomy due to baseline modified Rankin been quite high. He has shown some improvement in his strength but remains with significant aphasia and visual field defect. Continue close neurological monitoring and strict BP control as per post tpa protocol.mobilize out of bed. Therapy consults.D/w patient, daughter and son. This patient is critically ill and at significant risk of neurological worsening, death and care requires constant monitoring of vital signs, hemodynamics,respiratory and cardiac monitoring, extensive review of multiple databases, frequent neurological assessment, discussion with family, other specialists and medical decision making of high complexity.I have made any  additions or clarifications directly to the above note.This critical care time does not reflect procedure time, or teaching time or supervisory time of PA/NP/Med Resident etc but could involve care discussion time.  I spent 30 minutes of neurocritical care time  in the care of  this patient.     Antony Contras, MD Medical Director [place] Pager: [phone] 08/22/2018 4:56 PM  To contact Stroke Continuity provider, please refer to [email or web address] After hours, contact General Neurology

## 2018-08-23 MED ORDER — PANTOPRAZOLE SODIUM 40 MG PO TBEC
40.0000 mg | DELAYED_RELEASE_TABLET | Freq: Every day | ORAL | Status: DC
  Filled 2018-08-23: qty 1

## 2018-08-23 NOTE — Progress Notes (Signed)
CSW met with patient's daughter at bedside to discuss discharge plan. Patient admitted from [facility] where he went after last admit for rehab (see full assessment below). Family would like to see if [facility] is able to take the patient, and if not then he will return to [facility]. CSW sent referral to [facility], awaiting decision.   CSW to follow.  Laveda Abbe, [place] Clinical Social Worker [phone]         Clinical Social Work Assessment  Patient Details  Name: William [facility]inner MRN: [id number] Date of Birth: 1925/10/22  Date of referral:  08/07/18               Reason for consult:  Facility Placement, Discharge Planning                    Permission sought to share information with:  Family Supports Permission granted to share information::  Yes, Verbal Permission Granted             Name::     William Skinner::                Relationship::  daughter             Contact Information:  [phone]  Housing/Transportation Living arrangements for the past 2 months:  Assisted Living Facility([facility]) Source of Information:  Patient, Adult Children Patient Interpreter Needed:  None Criminal Activity/Legal Involvement Pertinent to Current Situation/Hospitalization:  No - Comment as needed Significant Relationships:  Adult Children Lives with:  Facility Resident Do you feel safe going back to the place where you live?  Yes Need for family participation in patient care:  Yes (Comment)  Care giving concerns:  Patient is from [facility]. Patient had daughter in law and son at bedside during assessment.   Social Worker assessment / plan:  Patient stated he would be agreeable to go to rehab if physical therapy recommends it. patients daughter in law stated that patient has had several falls at [facility] and does not feel that patient going back there at this time would be an appropriate venue. CSW went over the promise of SNF  placement and family stated they understood. CSW will continue to follow for discharge plans  Employment status:  Retired Forensic scientist:  Medicare PT Recommendations:  [place] / Referral to community resources:  [place]  Patient/Family's Response to care:  Patient and family appreciative of CSW role in care  Patient/Family's Understanding of and Emotional Response to Diagnosis, Current Treatment, and Prognosis:  CSW will follow up with family once bed offers are available   Emotional Assessment Appearance:  Appears stated age Attitude/Demeanor/Rapport:  Engaged Affect (typically observed):  Accepting Orientation:  Oriented to Self, Oriented to Place, Oriented to  Time, Oriented to Situation Alcohol / Substance use:  Not Applicable Psych involvement (Current and /or in the community):  No (Comment)  Discharge Needs  Concerns to be addressed:  Care Coordination Readmission within the last 30 days:  No Current discharge risk:  Dependent with Mobility Barriers to Discharge:  Continued Medical Work up   [employer], LCSW 08/07/2018, 4:30 PM

## 2018-08-23 NOTE — Progress Notes (Addendum)
STROKE TEAM PROGRESS NOTE   INTERVAL HISTORY No family at the bedside.  Patient awake, alert, sitting up in bed.  Much brighter than yesterday.  Not felt to be a good long-term anticoagulation candidate given age and current quality of life, therefore will not pursue further embolic work-up.  Vitals:   08/23/18 0320 08/23/18 0732 08/23/18 0752 08/23/18 1233  BP: 137/67  129/72 102/63  Pulse: 74  72 88  Resp: 20  20 18   Temp: 98.6 F (37 C)  97.8 F (36.6 C) 98.2 F (36.8 C)  TempSrc: Oral  Oral Oral  SpO2: 98% 99% 98% 99%  Weight:      Height:        CBC:  Recent Labs  Lab 08/21/18 1242  WBC 10.4  NEUTROABS 8.6*  HGB 13.8  HCT 44.6  MCV 96.3  PLT 245    Basic Metabolic Panel:  Recent Labs  Lab 08/21/18 1232 08/21/18 1242  NA  --  136  K  --  4.1  CL  --  100  CO2  --  25  GLUCOSE  --  152*  BUN  --  37*  CREATININE 1.30* 1.32*  CALCIUM  --  8.8*   Lipid Panel:     Component Value Date/Time   CHOL 157 08/22/2018 0455   TRIG 61 08/22/2018 0455   HDL 56 08/22/2018 0455   CHOLHDL 2.8 08/22/2018 0455   VLDL 12 08/22/2018 0455   LDLCALC 89 08/22/2018 0455   HgbA1c:  Lab Results  Component Value Date   HGBA1C 6.3 (H) 08/22/2018   Urine Drug Screen: No results found for: LABOPIA, COCAINSCRNUR, LABBENZ, AMPHETMU, THCU, LABBARB  Alcohol Level No results found for: [facility]  IMAGING Mr Brain Wo Contrast  Result Date: 08/22/2018 CLINICAL DATA:  Stroke EXAM: MRI HEAD WITHOUT CONTRAST TECHNIQUE: Multiplanar, multiecho pulse sequences of the brain and surrounding structures were obtained without intravenous contrast. COMPARISON:  CT 03/11/2019 FINDINGS: Brain: Acute infarct in the left parietal lobe extending to the posterior insula. This is a moderately large left MCA infarct. Small areas of acute infarct in the high right parietal lobe and right occipital lobe. Negative for hemorrhage. Moderate to advanced atrophy. Extensive chronic ischemic changes throughout the  cerebral white matter and pons. Small chronic infarcts in the cerebellum bilaterally. Image quality degraded by motion Vascular: Normal arterial flow voids. Skull and upper cervical spine: Negative Sinuses/Orbits: Mild mucosal edema paranasal sinuses. Prior sinus surgery. Bilateral cataract surgery Other: None IMPRESSION: Acute infarct left parietal lobe in the posterior left MCA territory. No associated hemorrhage. In addition, scattered small areas of acute infarct in the right occipital and right parietal lobe, raising the possibility of emboli. Extensive atrophy and extensive chronic ischemic change. Electronically Signed   By: Franchot Gallo M.D.   On: 08/22/2018 11:02    PHYSICAL EXAM Obese elderly caucasian male not in distress. . Afebrile. Head is nontraumatic. Neck is supple without bruit.    Cardiac exam no murmur or gallop. Lungs are clear to auscultation. Distal pulses are well felt.   Neurological Exam :  Awake alert expressive greater than receptive aphasia with nonfluent speech and significant word hesitancy. Follows simple midline and one-step commands only. Difficulty with naming and repetition. Mild dysarthria. Eye movements are full range. Right homonymous hemianopsia. Mild right lower facial weakness. Tongue midline. Motor system exam shows mild right hemiparesis with 4/5 strength with weakness of right grip and intrinsic hand muscles. Gait deferred ASSESSMENT/PLAN William Skinner  is a 85 y.o. male with history of right leg pain, osteoarthritis, hypertension, hyperlipidemia and carotid artery occlusion status post CEA during his bath found to be nonverbal, with R facial and R HIP. Received IV tPA 08/21/2017 at 1244.   Stroke:  left parietal lobe infarct and scattered left parietal and right occipital infarcts s/p IV tPA, infarcts felt to be embolic secondary to unknown source  Code Stroke CT head acute L MCA infarct w/ hyperdense L M2.  Small vessel disease. Atrophy. ASPECTS 7     CTA head & neck prox L M2 branch occlusion. IC atherosclerosis. Aortic atherosclerosis.   MRI left parietal lobe MCA territory infarct with scattered infarcts in the right occipital and right parietal lobe as well  2D Echo (08/07/2018)  EF 55-60%. No source of embolus   LDL 89  HgbA1c 6.3  SCDs for VTE prophylaxis  aspirin 81 mg daily prior to admission, now on aspirin 325 mg daily.  Will not consider further cardioembolic work-up at this time  Therapy recommendations:  SNF  Disposition:  pending  - in ALF for past few years, from [facility] for 1 week following a bout of PNA, family requesting change in SNF.  Social worker following.   Medically ready for discharge when bed available  Hypertension  Home meds: lasix 40, cozaar 100  BP 100-150s . Back n home BP meds  . Long-term BP goal normotensive  Hyperlipidemia  Home meds:  No statin  LDL 89, goal < 70  Add lipitor 10  Continue statin at discharge  Other Stroke Risk Factors  Advanced age  Former Cigarette smoker, quit 62 yrs ago  Hx Carotid artery stenosis s/p CEA  Other Active Problems  Recent PNA  Elevated HgbA1c - ? Related to recent tx w/ Prednisone. F/u as OP  Pt is a DNR at his facility. Daughter wishes for him to be a limited DNR here with no compressions, no intubation. Otherwise, treat. Order placed. RN to ask her to bring form for transport to facility at time of d/c.  Hospital day # 2  Burnetta Sabin, MSN, APRN, ANVP-BC, AGPCNP-BC Advanced Practice Stroke Nurse [place] for Schedule & Pager information 08/23/2018 1:57 PM  I have personally obtained history,examined this patient, reviewed notes, independently viewed imaging studies, participated in medical decision making and plan of care.ROS completed by me personally and pertinent positives fully documented  I have made any additions or clarifications directly to the above note. Agree with note above.    Antony Contras, MD Medical Director [place] [facility] St[facility] Pager: [phone] 08/23/2018 3:12 PM  To contact Stroke Continuity provider, please refer to [email or web address] After hours, contact General Neurology

## 2018-08-23 NOTE — Progress Notes (Addendum)
Physical Therapy Treatment Patient Details Name: William Skinner MRN: [id number] DOB: Dec 09, 1925 Today's Date: 08/23/2018    History of Present Illness Pt is a 85 y/o male with history of right leg pain, osteoarthritis, hypertension, hyperlipidemia and carotid artery occlusion status post CEA. Noted recent admission to [facility] and dc to SNF d/t pneumonia.  Found to be nonverbal, R facial and R HIP during his bath at SNF.  Recieved IV tPA 08/21/2018 at 1244. CT reveals acute L MCA infarct, MRI pending. Cleared for mobility by neurology.     PT Comments    Pt remains limited secondary to fatigue and weakness. He continues to require heavy physical assistance of two for transfers. Pt would continue to benefit from skilled physical therapy services at this time while admitted and after d/c to address the below listed limitations in order to improve overall safety and independence with functional mobility.    Follow Up Recommendations  SNF     Equipment Recommendations  None recommended by PT    Recommendations for Other Services       Precautions / Restrictions Precautions Precautions: Fall Precaution Comments: R sided neglect Restrictions Weight Bearing Restrictions: No    Mobility  Bed Mobility Overal bed mobility: Needs Assistance Bed Mobility: Supine to Sit     Supine to sit: Min assist     General bed mobility comments: HOB elevated, pt able to initiate LEs moving towards EOB with cueing, assist for trunk elevation and use of bed pads to position pt's hips at EOB  Transfers Overall transfer level: Needs assistance Equipment used: 1 person hand held assist;2 person hand held assist Transfers: Sit to/from Stand;Lateral/Scoot Transfers Sit to Stand: From elevated surface;Max assist;+2 physical assistance        Lateral/Scoot Transfers: Max assist;+2 physical assistance;+2 safety/equipment General transfer comment: increased time and effort, cueing for sequencing and  technique, pt with R lateral lean throughout; heavy physical assistance required with use of bed pads and gait belt  Ambulation/Gait                 Stairs             Wheelchair Mobility    Modified Rankin (Stroke Patients Only) Modified Rankin (Stroke Patients Only) Pre-Morbid Rankin Score: Moderately severe disability Modified Rankin: Severe disability     Balance Overall balance assessment: History of Falls;Needs assistance Sitting-balance support: Feet supported;Single extremity supported Sitting balance-Leahy Scale: Poor   Postural control: Right lateral lean Standing balance support: During functional activity Standing balance-Leahy Scale: Zero Standing balance comment: pt dependent on physical assist to stand                            Cognition Arousal/Alertness: Awake/alert Behavior During Therapy: Flat affect Overall Cognitive Status: Difficult to assess Area of Impairment: Attention;Memory;Following commands;Safety/judgement;Problem solving                   Current Attention Level: Focused Memory: Decreased short-term memory Following Commands: Follows one step commands inconsistently;Follows one step commands with increased time Safety/Judgement: Decreased awareness of safety;Decreased awareness of deficits   Problem Solving: Requires verbal cues;Requires tactile cues        Exercises      General Comments        Pertinent Vitals/Pain Pain Assessment: No/denies pain    Home Living  Prior Function            PT Goals (current goals can now be found in the care plan section) Acute Rehab PT Goals PT Goal Formulation: With patient Time For Goal Achievement: 09/04/18 Potential to Achieve Goals: Good Progress towards PT goals: Progressing toward goals    Frequency    Min 3X/week      PT Plan Current plan remains appropriate    Co-evaluation              AM-PAC PT "6  Clicks" Mobility   Outcome Measure  Help needed turning from your back to your side while in a flat bed without using bedrails?: A Little Help needed moving from lying on your back to sitting on the side of a flat bed without using bedrails?: A Lot Help needed moving to and from a bed to a chair (including a wheelchair)?: Total Help needed standing up from a chair using your arms (e.g., wheelchair or bedside chair)?: Total Help needed to walk in hospital room?: Total Help needed climbing 3-5 steps with a railing? : Total 6 Click Score: 9    End of Session Equipment Utilized During Treatment: Gait belt Activity Tolerance: Patient limited by fatigue Patient left: in chair;with call bell/phone within reach;with chair alarm set Nurse Communication: Mobility status PT Visit Diagnosis: Muscle weakness (generalized) (M62.81);Repeated falls (R29.6)     Time: 8867-7373 PT Time Calculation (min) (ACUTE ONLY): 24 min  Charges:  $Therapeutic Activity: 23-37 mins                     Sherie Don, [place], DPT  Acute Rehabilitation Services Pager [phone] Office [place] 08/23/2018, 3:57 PM

## 2018-08-23 NOTE — Progress Notes (Signed)
Pt has red rashes all over this face back and trunk to the groin but denied any itching. MD to review and treat as needed this am. BUE  ecchymotic areas .  Follows command, but very slurred speech and rt  hemianopsia.

## 2018-08-23 NOTE — NC FL2 (Addendum)
[place] LEVEL OF CARE SCREENING TOOL     IDENTIFICATION  Patient Name: William Skinner Birthdate: [birth year] Sex: male Admission Date (Current Location): 08/21/2018  [facility] and [place] Number:  Herbalist and Address:  [facility] [place] [place], [place] [address] [place], [place] [address]      Provider Number: [id number]  Attending Physician Name and Address:  Garvin Fila, MD  Relative Name and Phone Number:       Current Level of Care: Hospital Recommended Level of Care: [place] Prior Approval Number:    Date Approved/Denied:   PASRR Number: [id number]  Discharge Plan: SNF    Current Diagnoses: Patient Active Problem List   Diagnosis Date Noted  . Stroke (cerebrum) ([place]) 08/21/2018  . Acute respiratory failure ([place]) 08/06/2018  . Generalized weakness 08/06/2018  . Diarrhea of presumed infectious origin 08/06/2018  . Community acquired pneumonia 08/06/2018  . H/O carotid endarterectomy 06/01/2016  . Primary osteoarthritis of right knee 05/31/2016  . Essential hypertension 05/31/2016  . H/O spinal stenosis 05/31/2016  . Pain in joint, lower leg 03/06/2014    Orientation RESPIRATION BLADDER Height & Weight     Self, Time, Situation, Place  Normal Incontinent, External catheter Weight: 182 lb 15.7 oz (83 kg) Height:  6' (182.9 cm)  BEHAVIORAL SYMPTOMS/MOOD NEUROLOGICAL BOWEL NUTRITION STATUS      Continent Diet(see DC summary)  AMBULATORY STATUS COMMUNICATION OF NEEDS Skin   Extensive Assist Verbally Skin abrasions, Bruising                       Personal Care Assistance Level of Assistance  Bathing, Feeding, Dressing Bathing Assistance: Limited assistance Feeding assistance: Limited assistance Dressing Assistance: Limited assistance     Functional Limitations Info  Sight, Hearing, Speech Sight Info: Adequate Hearing Info: Impaired(hard of hearing) Speech Info: Impaired(expressive aphasia)     SPECIAL CARE FACTORS FREQUENCY  PT (By licensed PT), OT (By licensed OT)     PT Frequency: 5x/wk OT Frequency: 5x/wk            Contractures Contractures Info: Not present    Additional Factors Info  Code Status, Allergies Code Status Info: DNR Allergies Info: NKA           Current Medications (08/23/2018):  This is the current hospital active medication list Current Facility-Administered Medications  Medication Dose Route Frequency Provider Last Rate Last Dose  . 0.9 %  sodium chloride infusion   Intravenous Continuous Donzetta Starch, NP 75 mL/hr at 08/23/18 0618    . albuterol (PROVENTIL) (2.5 MG/3ML) 0.083% nebulizer solution 2.5 mg  2.5 mg Nebulization Q4H PRN Burnetta Sabin L, NP      . aspirin tablet 325 mg  325 mg Oral Daily Burnetta Sabin L, NP   325 mg at 08/23/18 0831  . feeding supplement (BOOST / RESOURCE BREEZE) liquid 1 Container  1 Container Oral BID BM Donzetta Starch, NP   1 Container at 08/23/18 [phone]  . furosemide (LASIX) tablet 40 mg  40 mg Oral Daily Burnetta Sabin L, NP   40 mg at 08/23/18 7673  . ipratropium-albuterol (DUONEB) 0.5-2.5 (3) MG/3ML nebulizer solution 3 mL  3 mL Nebulization BID Burnetta Sabin L, NP   3 mL at 08/23/18 0730  . losartan (COZAAR) tablet 100 mg  100 mg Oral Daily Burnetta Sabin L, NP   100 mg at 08/23/18 0831  . MEDLINE mouth rinse  15 mL  Mouth Rinse BID Burnetta Sabin L, NP   15 mL at 08/23/18 0852  . pantoprazole (PROTONIX) EC tablet 40 mg  40 mg Oral QHS Garvin Fila, MD      . senna-docusate (Senokot-S) tablet 1 tablet  1 tablet Oral QHS PRN Donzetta Starch, NP         Discharge Medications: Please see discharge summary for a list of discharge medications.  Relevant Imaging Results:  Relevant Lab Results:   Additional Information SS#: [id number]  Geralynn Ochs, LCSW  I have personally obtained history,examined this patient, reviewed notes, independently viewed imaging studies, participated in medical decision  making and plan of care.ROS completed by me personally and pertinent positives fully documented  I have made any additions or clarifications directly to the above note. Agree with note above.    Antony Contras, MD Medical Director [facility] Pager: [phone] 08/23/2018 8:15 PM

## 2018-08-24 DIAGNOSIS — R110 Nausea: Secondary | ICD-10-CM | POA: Diagnosis not present

## 2018-08-24 DIAGNOSIS — G459 Transient cerebral ischemic attack, unspecified: Secondary | ICD-10-CM | POA: Diagnosis not present

## 2018-08-24 DIAGNOSIS — R52 Pain, unspecified: Secondary | ICD-10-CM | POA: Diagnosis not present

## 2018-08-24 DIAGNOSIS — Z23 Encounter for immunization: Secondary | ICD-10-CM | POA: Diagnosis not present

## 2018-08-24 DIAGNOSIS — G301 Alzheimer's disease with late onset: Secondary | ICD-10-CM | POA: Diagnosis not present

## 2018-08-24 DIAGNOSIS — H109 Unspecified conjunctivitis: Secondary | ICD-10-CM | POA: Diagnosis not present

## 2018-08-24 DIAGNOSIS — H04122 Dry eye syndrome of left lacrimal gland: Secondary | ICD-10-CM | POA: Diagnosis not present

## 2018-08-24 DIAGNOSIS — R600 Localized edema: Secondary | ICD-10-CM | POA: Diagnosis not present

## 2018-08-24 DIAGNOSIS — R410 Disorientation, unspecified: Secondary | ICD-10-CM | POA: Diagnosis not present

## 2018-08-24 DIAGNOSIS — I69320 Aphasia following cerebral infarction: Secondary | ICD-10-CM | POA: Diagnosis not present

## 2018-08-24 DIAGNOSIS — R197 Diarrhea, unspecified: Secondary | ICD-10-CM | POA: Diagnosis not present

## 2018-08-24 DIAGNOSIS — R911 Solitary pulmonary nodule: Secondary | ICD-10-CM | POA: Diagnosis not present

## 2018-08-24 DIAGNOSIS — R918 Other nonspecific abnormal finding of lung field: Secondary | ICD-10-CM | POA: Diagnosis not present

## 2018-08-24 DIAGNOSIS — I6300 Cerebral infarction due to thrombosis of unspecified precerebral artery: Secondary | ICD-10-CM | POA: Diagnosis not present

## 2018-08-24 DIAGNOSIS — R41841 Cognitive communication deficit: Secondary | ICD-10-CM | POA: Diagnosis not present

## 2018-08-24 DIAGNOSIS — M179 Osteoarthritis of knee, unspecified: Secondary | ICD-10-CM | POA: Diagnosis not present

## 2018-08-24 DIAGNOSIS — R5381 Other malaise: Secondary | ICD-10-CM

## 2018-08-24 DIAGNOSIS — F0280 Dementia in other diseases classified elsewhere without behavioral disturbance: Secondary | ICD-10-CM | POA: Diagnosis not present

## 2018-08-24 DIAGNOSIS — R4789 Other speech disturbances: Secondary | ICD-10-CM | POA: Diagnosis not present

## 2018-08-24 DIAGNOSIS — M6281 Muscle weakness (generalized): Secondary | ICD-10-CM | POA: Diagnosis not present

## 2018-08-24 DIAGNOSIS — M2550 Pain in unspecified joint: Secondary | ICD-10-CM | POA: Diagnosis not present

## 2018-08-24 DIAGNOSIS — Z111 Encounter for screening for respiratory tuberculosis: Secondary | ICD-10-CM | POA: Diagnosis not present

## 2018-08-24 DIAGNOSIS — R531 Weakness: Secondary | ICD-10-CM

## 2018-08-24 DIAGNOSIS — R062 Wheezing: Secondary | ICD-10-CM | POA: Diagnosis not present

## 2018-08-24 DIAGNOSIS — I639 Cerebral infarction, unspecified: Secondary | ICD-10-CM | POA: Diagnosis not present

## 2018-08-24 DIAGNOSIS — I69351 Hemiplegia and hemiparesis following cerebral infarction affecting right dominant side: Secondary | ICD-10-CM | POA: Diagnosis not present

## 2018-08-24 DIAGNOSIS — Z7189 Other specified counseling: Secondary | ICD-10-CM

## 2018-08-24 DIAGNOSIS — G8190 Hemiplegia, unspecified affecting unspecified side: Secondary | ICD-10-CM | POA: Diagnosis not present

## 2018-08-24 DIAGNOSIS — E785 Hyperlipidemia, unspecified: Secondary | ICD-10-CM | POA: Diagnosis not present

## 2018-08-24 DIAGNOSIS — R2681 Unsteadiness on feet: Secondary | ICD-10-CM | POA: Diagnosis not present

## 2018-08-24 DIAGNOSIS — R739 Hyperglycemia, unspecified: Secondary | ICD-10-CM | POA: Diagnosis not present

## 2018-08-24 DIAGNOSIS — H1032 Unspecified acute conjunctivitis, left eye: Secondary | ICD-10-CM | POA: Diagnosis not present

## 2018-08-24 DIAGNOSIS — I10 Essential (primary) hypertension: Secondary | ICD-10-CM | POA: Diagnosis not present

## 2018-08-24 DIAGNOSIS — K5900 Constipation, unspecified: Secondary | ICD-10-CM | POA: Diagnosis not present

## 2018-08-24 DIAGNOSIS — R21 Rash and other nonspecific skin eruption: Secondary | ICD-10-CM | POA: Diagnosis not present

## 2018-08-24 DIAGNOSIS — J9600 Acute respiratory failure, unspecified whether with hypoxia or hypercapnia: Secondary | ICD-10-CM | POA: Diagnosis not present

## 2018-08-24 DIAGNOSIS — Z7401 Bed confinement status: Secondary | ICD-10-CM | POA: Diagnosis not present

## 2018-08-24 DIAGNOSIS — J9601 Acute respiratory failure with hypoxia: Secondary | ICD-10-CM | POA: Diagnosis not present

## 2018-08-24 DIAGNOSIS — I63312 Cerebral infarction due to thrombosis of left middle cerebral artery: Secondary | ICD-10-CM | POA: Diagnosis not present

## 2018-08-24 MED ORDER — ASPIRIN 325 MG PO TABS: 325.0000 mg | ORAL_TABLET | Freq: Every day | ORAL | 1 refills | Status: AC

## 2018-08-24 NOTE — Clinical Social Work Placement (Signed)
Nurse to call report to [phone], Room 607   Transport set for 2:00 PM.       CLINICAL SOCIAL WORK PLACEMENT  NOTE  Date:  08/24/2018  Patient Details  Name: William Skinner MRN: [id number] Date of Birth: 1926-06-03  Clinical Social Work is seeking post-discharge placement for this patient at the [place] level of care (*CSW will initial, date and re-position this form in  chart as items are completed):  Yes   Patient/family provided with [place] Work Department's list of facilities offering this level of care within the geographic area requested by the patient (or if unable, by the patient's family).  Yes   Patient/family informed of their freedom to choose among providers that offer the needed level of care, that participate in Medicare, Medicaid or managed care program needed by the patient, have an available bed and are willing to accept the patient.  Yes   Patient/family informed of [employer]'s ownership interest in [facility] and [place], as well as of the fact that they are under no obligation to receive care at these facilities.  PASRR submitted to EDS on       PASRR number received on       Existing PASRR number confirmed on 08/23/18     FL2 transmitted to all facilities in geographic area requested by pt/family on 08/23/18     FL2 transmitted to all facilities within larger geographic area on       Patient informed that his/her managed care company has contracts with or will negotiate with certain facilities, including the following:        Yes   Patient/family informed of bed offers received.  Patient chooses bed at [facility]     Physician recommends and patient chooses bed at      Patient to be transferred to [facility] on 08/24/18.  Patient to be transferred to facility by PTAR     Patient family notified on 08/24/18 of transfer.  Name of family member notified:  Catalina Lunger     PHYSICIAN        Additional Comment:    _______________________________________________ Geralynn Ochs, [place] 08/24/2018, 11:39 AM

## 2018-08-24 NOTE — Discharge Summary (Addendum)
Physician Discharge Summary  Patient ID: William Skinner MRN: [id number] DOB/AGE: [birth year] 85 y.o.  Admit date: 08/21/2018 Discharge date: 08/24/2018  Admission Diagnoses: Stroke Discharge Diagnoses:  Active Problems:   Stroke (cerebrum) (HCC) : embolic left parietal MCA branch infarct status post treatment with IV TPA etiology cryptogenic   Discharged Condition: fair Hospital Course: William Skinner is a 85 y.o. male with history of right leg pain, osteoarthritis, hypertension, hyperlipidemia and carotid artery occlusion status post CEA during his bath found to be nonverbal, with R facial and R HIP. Received IV tPA 08/21/2017 at 1244. Imaging showed left parietal lobe infarct and scattered left parietal and right occipital infarcts. Etiology appears embolic, source is unknown, but no further testing such as loop done since pt is DNR and goals of care were d/w family and he is not a long-term anticoagulation candidate. He is stable for d/c back to his SNF   Significant Diagnostic Studies: CTA, MRI  Treatments: IV tPA  Discharge Exam: Blood pressure 120/70, pulse 66, temperature 98.2 F (36.8 C), temperature source Axillary, resp. rate 20, height 6' (1.829 m), weight 83 kg, SpO2 99 %. Awake alert expressive aphasia with nonfluent speech and significant word hesitancy. Follows simple midline and one-step commands only. Difficulty with naming and repetition. Able to orient to place when given choices. Mild dysarthria. Eye movements are full range. Right homonymous hemianopsia. Mild right lower facial weakness. Tongue midline. Motor system exam shows mild right hemiparesis with 4/5 strength with weakness of right grip and intrinsic hand muscles. Gait deferred  Disposition:  Fair; stable  Allergies as of 08/24/2018   No Known Allergies     Medication List    STOP taking these medications   aspirin EC 81 MG tablet Replaced by:  aspirin 325 MG tablet   predniSONE 10 MG tablet Commonly  known as:  DELTASONE     TAKE these medications   acetaminophen 500 MG tablet Commonly known as:  TYLENOL Take 1,000 mg by mouth 3 (three) times daily as needed for moderate pain or headache. Reported on 07/29/2015   albuterol (2.5 MG/3ML) 0.083% nebulizer solution Commonly known as:  PROVENTIL Take 2.5 mg by nebulization every 4 (four) hours as needed for wheezing (cough).   albuterol 108 (90 Base) MCG/ACT inhaler Commonly known as:  PROVENTIL HFA;VENTOLIN HFA Inhale 2 puffs into the lungs every 6 (six) hours as needed (acute respiratory failure).   aspirin 325 MG tablet Take 1 tablet (325 mg total) by mouth daily. Start taking on:  August 25, 2018 Replaces:  aspirin EC 81 MG tablet   CERTAVITE/ANTIOXIDANTS PO Take 1 tablet by mouth daily.   clobetasol cream 0.05 % Commonly known as:  TEMOVATE Apply 1 application topically 2 (two) times daily. Apply daily or bid to Bilteral lower extremity rash.   docusate sodium 100 MG capsule Commonly known as:  COLACE Take 1 capsule (100 mg total) by mouth 2 (two) times daily as needed for mild constipation.   RESOURCE 2.0 Liqd Take 237 mLs by mouth 2 (two) times daily between meals. Vanilla flavor   feeding supplement (ENSURE ENLIVE) Liqd Take 237 mLs by mouth 2 (two) times daily between meals.   furosemide 40 MG tablet Commonly known as:  LASIX Take 1 tablet (40 mg total) by mouth daily for 6 days. What changed:    when to take this  additional instructions   loperamide 2 MG capsule Commonly known as:  IMODIUM A-D Take 1 capsule (2 mg total)  by mouth every 4 (four) hours as needed for diarrhea or loose stools.   losartan 100 MG tablet Commonly known as:  COZAAR Take 100 mg by mouth daily.   ondansetron 4 MG tablet Commonly known as:  ZOFRAN Take 1 tablet (4 mg total) by mouth every 8 (eight) hours as needed for nausea or vomiting.     D/C time 107min   Signed: Desiree Metzger-Cihelka ARNP-C, ANVP-BC 08/24/2018, 9:41  AM I have personally obtained history,examined this patient, reviewed notes, independently viewed imaging studies, participated in medical decision making and plan of care.ROS completed by me personally and pertinent positives fully documented  I have made any additions or clarifications directly to the above note. Agree with note above.    Antony Contras, MD Medical Director [place] Pager: [phone] 08/24/2018 4:42 PM

## 2018-08-24 NOTE — Care Management Important Message (Signed)
Important Message  Patient Details  Name: William Skinner MRN: [id number] Date of Birth: [birth year]   Medicare Important Message Given:  Yes    Edwinna Rochette 08/24/2018, 2:10 PM

## 2018-08-24 NOTE — Consult Note (Signed)
   [facility] CM Inpatient Consult   08/24/2018  William Skinner March 19, 1926 [id number]   Patient chart has been reviewed for readmissions less than 30 days under patient's Medicare ACO Plan.  Chart review reveals patient disposition plan is for SNF. No [facility] Care Management needs at this time.   Netta Cedars, MSN, [facility] Liaison Nurse Mobile Phone [phone]  Toll free office [phone]

## 2018-08-24 NOTE — Progress Notes (Signed)
Attempted to call report to SNF [facility]) x2, left voicemail, no reply at this time. William Skinner

## 2018-08-24 NOTE — Progress Notes (Signed)
Report given to [place]. Arlis Porta

## 2018-08-24 NOTE — Progress Notes (Addendum)
STROKE TEAM PROGRESS NOTE   INTERVAL HISTORY No family at the bedside.  Patient awake, alert, sitting up in bed eating breakfast. Stable neuro exam. No further wk up at this time. D/w Case mgt, he is ready for d/c back to SNF.   Vitals:   08/23/18 2350 08/24/18 0405 08/24/18 0808 08/24/18 0814  BP: (!) 117/57 122/66  120/70  Pulse: (!) 54 72  66  Resp: 18 20  20   Temp: 98.1 F (36.7 C) 98 F (36.7 C)  98.2 F (36.8 C)  TempSrc: Oral Axillary  Axillary  SpO2: 96% 97% 97% 99%  Weight:      Height:        CBC:  Recent Labs  Lab 08/21/18 1242  WBC 10.4  NEUTROABS 8.6*  HGB 13.8  HCT 44.6  MCV 96.3  PLT 417    Basic Metabolic Panel:  Recent Labs  Lab 08/21/18 1232 08/21/18 1242  NA  --  136  K  --  4.1  CL  --  100  CO2  --  25  GLUCOSE  --  152*  BUN  --  37*  CREATININE 1.30* 1.32*  CALCIUM  --  8.8*   Lipid Panel:     Component Value Date/Time   CHOL 157 08/22/2018 0455   TRIG 61 08/22/2018 0455   HDL 56 08/22/2018 0455   CHOLHDL 2.8 08/22/2018 0455   VLDL 12 08/22/2018 0455   LDLCALC 89 08/22/2018 0455   HgbA1c:  Lab Results  Component Value Date   HGBA1C 6.3 (H) 08/22/2018   Urine Drug Screen: No results found for: LABOPIA, COCAINSCRNUR, LABBENZ, AMPHETMU, THCU, LABBARB  Alcohol Level No results found for: [facility]  IMAGING Mr Brain Wo Contrast  Result Date: 08/22/2018 CLINICAL DATA:  Stroke EXAM: MRI HEAD WITHOUT CONTRAST TECHNIQUE: Multiplanar, multiecho pulse sequences of the brain and surrounding structures were obtained without intravenous contrast. COMPARISON:  CT 03/11/2019 FINDINGS: Brain: Acute infarct in the left parietal lobe extending to the posterior insula. This is a moderately large left MCA infarct. Small areas of acute infarct in the high right parietal lobe and right occipital lobe. Negative for hemorrhage. Moderate to advanced atrophy. Extensive chronic ischemic changes throughout the cerebral white matter and pons. Small chronic  infarcts in the cerebellum bilaterally. Image quality degraded by motion Vascular: Normal arterial flow voids. Skull and upper cervical spine: Negative Sinuses/Orbits: Mild mucosal edema paranasal sinuses. Prior sinus surgery. Bilateral cataract surgery Other: None IMPRESSION: Acute infarct left parietal lobe in the posterior left MCA territory. No associated hemorrhage. In addition, scattered small areas of acute infarct in the right occipital and right parietal lobe, raising the possibility of emboli. Extensive atrophy and extensive chronic ischemic change. Electronically Signed   By: Franchot Gallo M.D.   On: 08/22/2018 11:02    PHYSICAL EXAM Obese elderly caucasian male not in distress. Afebrile. Head is nontraumatic. Neck is supple without bruit.    Cardiac exam no murmur or gallop. Lungs are clear to auscultation. Distal pulses are well felt. Left eye has very red conjunctiva, matted with exudate, pt denies this is painful   Neurological Exam :  Awake alert expressive aphasia with nonfluent speech and significant word hesitancy. Follows simple midline and one-step commands only. Difficulty with naming and repetition. Able to orient to place when given choices. Mild dysarthria. Eye movements are full range. Right homonymous hemianopsia. Mild right lower facial weakness. Tongue midline. Motor system exam shows mild right hemiparesis with 4/5 strength with  weakness of right grip and intrinsic hand muscles. Gait deferred  ASSESSMENT/PLAN [name] is a 85 y.o. male with history of right leg pain, osteoarthritis, hypertension, hyperlipidemia and carotid artery occlusion status post CEA during his bath found to be nonverbal, with R facial and R HIP. Received IV tPA 08/21/2017 at 1244.   Stroke:  left parietal lobe infarct and scattered left parietal and right occipital infarcts s/p IV tPA, infarcts felt to be embolic secondary to unknown source  Code Stroke CT head acute L MCA infarct w/  hyperdense L M2.  Small vessel disease. Atrophy. ASPECTS 7    CTA head & neck prox L M2 branch occlusion. IC atherosclerosis. Aortic atherosclerosis.   MRI left parietal lobe MCA territory infarct with scattered infarcts in the right occipital and right parietal lobe as well  2D Echo (08/07/2018)  EF 55-60%. No source of embolus   LDL 89  HgbA1c 6.3  SCDs for VTE prophylaxis  aspirin 81 mg daily prior to admission, now on aspirin 325 mg daily.  Will not consider further cardioembolic work-up at this time given goals of care per family  Therapy recommendations:  SNF  Disposition:  pending  - in ALF for past few years, from [facility] for 1 week following a bout of PNA, family requesting change in SNF.  Social worker following.   Medically ready for discharge when bed available  Hypertension  Home meds: lasix 40, cozaar 100  BP 100-150s . Back n home BP meds  . Long-term BP goal normotensive  Hyperlipidemia  Home meds:  No statin  LDL 89, goal < 70  Add lipitor 10  Continue statin at discharge  Other Stroke Risk Factors  Advanced age  Former Cigarette smoker, quit 60 yrs ago  Hx Carotid artery stenosis s/p CEA  Other Active Problems  Recent PNA  Elevated HgbA1c - ? Related to recent tx w/ Prednisone. F/u as OP  Pt is a DNR at his facility. Daughter wishes for him to be a limited DNR here with no compressions, no intubation. Will need dtr to bring DNR form for transport to facility at time of d/c. I have updated and d/w case [facility] day # 3 Time spent on DC summary 35 minutes  Desiree Metzger-Cihelka, MSN, APRN, ANVP-BC,  Advanced Practice Stroke Nurse [place] for Schedule & Pager information 08/24/2018 9:12 AM  I have personally obtained history,examined this patient, reviewed notes, independently viewed imaging studies, participated in medical decision making and plan of care.ROS completed by me personally and  pertinent positives fully documented  I have made any additions or clarifications directly to the above note. Agree with note above.    Antony Contras, MD Medical Director [place] Pager: [phone] 08/24/2018 2:37 PM  To contact Stroke Continuity provider, please refer to [email or web address] After hours, contact General Neurology

## 2018-08-24 NOTE — Progress Notes (Signed)
Pt d/c, left via stretcher with PTAR. AVS given to EMT, all IV lines removed, pt A/O x4 at time of d/c, notified family of transport. William Skinner

## 2018-08-25 DIAGNOSIS — R21 Rash and other nonspecific skin eruption: Secondary | ICD-10-CM | POA: Diagnosis not present

## 2018-08-25 DIAGNOSIS — R110 Nausea: Secondary | ICD-10-CM | POA: Diagnosis not present

## 2018-08-25 DIAGNOSIS — R52 Pain, unspecified: Secondary | ICD-10-CM | POA: Diagnosis not present

## 2018-08-25 DIAGNOSIS — K5900 Constipation, unspecified: Secondary | ICD-10-CM | POA: Diagnosis not present

## 2018-08-25 DIAGNOSIS — M6281 Muscle weakness (generalized): Secondary | ICD-10-CM | POA: Diagnosis not present

## 2018-08-25 DIAGNOSIS — I639 Cerebral infarction, unspecified: Secondary | ICD-10-CM | POA: Diagnosis not present

## 2018-08-25 DIAGNOSIS — I10 Essential (primary) hypertension: Secondary | ICD-10-CM | POA: Diagnosis not present

## 2018-08-25 DIAGNOSIS — J9600 Acute respiratory failure, unspecified whether with hypoxia or hypercapnia: Secondary | ICD-10-CM | POA: Diagnosis not present

## 2018-08-25 DIAGNOSIS — R197 Diarrhea, unspecified: Secondary | ICD-10-CM | POA: Diagnosis not present

## 2018-08-25 DIAGNOSIS — M179 Osteoarthritis of knee, unspecified: Secondary | ICD-10-CM | POA: Diagnosis not present

## 2018-08-25 DIAGNOSIS — E785 Hyperlipidemia, unspecified: Secondary | ICD-10-CM | POA: Diagnosis not present

## 2018-08-25 DIAGNOSIS — R600 Localized edema: Secondary | ICD-10-CM | POA: Diagnosis not present

## 2018-08-28 DIAGNOSIS — E785 Hyperlipidemia, unspecified: Secondary | ICD-10-CM | POA: Diagnosis not present

## 2018-08-28 DIAGNOSIS — R21 Rash and other nonspecific skin eruption: Secondary | ICD-10-CM | POA: Diagnosis not present

## 2018-08-28 DIAGNOSIS — I10 Essential (primary) hypertension: Secondary | ICD-10-CM | POA: Diagnosis not present

## 2018-08-28 DIAGNOSIS — H1032 Unspecified acute conjunctivitis, left eye: Secondary | ICD-10-CM | POA: Diagnosis not present

## 2018-08-28 DIAGNOSIS — I639 Cerebral infarction, unspecified: Secondary | ICD-10-CM | POA: Diagnosis not present

## 2018-08-28 DIAGNOSIS — M6281 Muscle weakness (generalized): Secondary | ICD-10-CM | POA: Diagnosis not present

## 2018-08-28 DIAGNOSIS — J9600 Acute respiratory failure, unspecified whether with hypoxia or hypercapnia: Secondary | ICD-10-CM | POA: Diagnosis not present

## 2018-08-28 DIAGNOSIS — R197 Diarrhea, unspecified: Secondary | ICD-10-CM | POA: Diagnosis not present

## 2018-08-28 DIAGNOSIS — M179 Osteoarthritis of knee, unspecified: Secondary | ICD-10-CM | POA: Diagnosis not present

## 2018-08-28 DIAGNOSIS — R110 Nausea: Secondary | ICD-10-CM | POA: Diagnosis not present

## 2018-08-28 DIAGNOSIS — K5900 Constipation, unspecified: Secondary | ICD-10-CM | POA: Diagnosis not present

## 2018-08-28 DIAGNOSIS — R600 Localized edema: Secondary | ICD-10-CM | POA: Diagnosis not present

## 2018-08-29 DIAGNOSIS — R21 Rash and other nonspecific skin eruption: Secondary | ICD-10-CM | POA: Diagnosis not present

## 2018-08-29 DIAGNOSIS — R600 Localized edema: Secondary | ICD-10-CM | POA: Diagnosis not present

## 2018-08-29 DIAGNOSIS — I63312 Cerebral infarction due to thrombosis of left middle cerebral artery: Secondary | ICD-10-CM | POA: Diagnosis not present

## 2018-08-29 DIAGNOSIS — H1032 Unspecified acute conjunctivitis, left eye: Secondary | ICD-10-CM | POA: Diagnosis not present

## 2018-08-29 DIAGNOSIS — R52 Pain, unspecified: Secondary | ICD-10-CM | POA: Diagnosis not present

## 2018-08-29 DIAGNOSIS — M179 Osteoarthritis of knee, unspecified: Secondary | ICD-10-CM | POA: Diagnosis not present

## 2018-08-29 DIAGNOSIS — M6281 Muscle weakness (generalized): Secondary | ICD-10-CM | POA: Diagnosis not present

## 2018-08-29 DIAGNOSIS — K5900 Constipation, unspecified: Secondary | ICD-10-CM | POA: Diagnosis not present

## 2018-08-29 DIAGNOSIS — J9600 Acute respiratory failure, unspecified whether with hypoxia or hypercapnia: Secondary | ICD-10-CM | POA: Diagnosis not present

## 2018-08-29 DIAGNOSIS — I10 Essential (primary) hypertension: Secondary | ICD-10-CM | POA: Diagnosis not present

## 2018-08-29 DIAGNOSIS — R110 Nausea: Secondary | ICD-10-CM | POA: Diagnosis not present

## 2018-08-29 DIAGNOSIS — E785 Hyperlipidemia, unspecified: Secondary | ICD-10-CM | POA: Diagnosis not present

## 2018-09-01 DIAGNOSIS — R600 Localized edema: Secondary | ICD-10-CM | POA: Diagnosis not present

## 2018-09-01 DIAGNOSIS — H1032 Unspecified acute conjunctivitis, left eye: Secondary | ICD-10-CM | POA: Diagnosis not present

## 2018-09-01 DIAGNOSIS — I63312 Cerebral infarction due to thrombosis of left middle cerebral artery: Secondary | ICD-10-CM | POA: Diagnosis not present

## 2018-09-01 DIAGNOSIS — I10 Essential (primary) hypertension: Secondary | ICD-10-CM | POA: Diagnosis not present

## 2018-09-01 DIAGNOSIS — R197 Diarrhea, unspecified: Secondary | ICD-10-CM | POA: Diagnosis not present

## 2018-09-01 DIAGNOSIS — R110 Nausea: Secondary | ICD-10-CM | POA: Diagnosis not present

## 2018-09-01 DIAGNOSIS — J9600 Acute respiratory failure, unspecified whether with hypoxia or hypercapnia: Secondary | ICD-10-CM | POA: Diagnosis not present

## 2018-09-01 DIAGNOSIS — R21 Rash and other nonspecific skin eruption: Secondary | ICD-10-CM | POA: Diagnosis not present

## 2018-09-01 DIAGNOSIS — M6281 Muscle weakness (generalized): Secondary | ICD-10-CM | POA: Diagnosis not present

## 2018-09-01 DIAGNOSIS — E785 Hyperlipidemia, unspecified: Secondary | ICD-10-CM | POA: Diagnosis not present

## 2018-09-01 DIAGNOSIS — K5900 Constipation, unspecified: Secondary | ICD-10-CM | POA: Diagnosis not present

## 2018-09-01 DIAGNOSIS — M179 Osteoarthritis of knee, unspecified: Secondary | ICD-10-CM | POA: Diagnosis not present

## 2018-09-04 DIAGNOSIS — H04122 Dry eye syndrome of left lacrimal gland: Secondary | ICD-10-CM | POA: Diagnosis not present

## 2018-09-04 DIAGNOSIS — R21 Rash and other nonspecific skin eruption: Secondary | ICD-10-CM | POA: Diagnosis not present

## 2018-09-04 DIAGNOSIS — R600 Localized edema: Secondary | ICD-10-CM | POA: Diagnosis not present

## 2018-09-04 DIAGNOSIS — I63312 Cerebral infarction due to thrombosis of left middle cerebral artery: Secondary | ICD-10-CM | POA: Diagnosis not present

## 2018-09-04 DIAGNOSIS — M179 Osteoarthritis of knee, unspecified: Secondary | ICD-10-CM | POA: Diagnosis not present

## 2018-09-04 DIAGNOSIS — I10 Essential (primary) hypertension: Secondary | ICD-10-CM | POA: Diagnosis not present

## 2018-09-04 DIAGNOSIS — M6281 Muscle weakness (generalized): Secondary | ICD-10-CM | POA: Diagnosis not present

## 2018-09-04 DIAGNOSIS — K5900 Constipation, unspecified: Secondary | ICD-10-CM | POA: Diagnosis not present

## 2018-09-04 DIAGNOSIS — R197 Diarrhea, unspecified: Secondary | ICD-10-CM | POA: Diagnosis not present

## 2018-09-04 DIAGNOSIS — J9600 Acute respiratory failure, unspecified whether with hypoxia or hypercapnia: Secondary | ICD-10-CM | POA: Diagnosis not present

## 2018-09-04 DIAGNOSIS — E785 Hyperlipidemia, unspecified: Secondary | ICD-10-CM | POA: Diagnosis not present

## 2018-09-04 DIAGNOSIS — H1032 Unspecified acute conjunctivitis, left eye: Secondary | ICD-10-CM | POA: Diagnosis not present

## 2018-09-08 DIAGNOSIS — M179 Osteoarthritis of knee, unspecified: Secondary | ICD-10-CM | POA: Diagnosis not present

## 2018-09-08 DIAGNOSIS — I63312 Cerebral infarction due to thrombosis of left middle cerebral artery: Secondary | ICD-10-CM | POA: Diagnosis not present

## 2018-09-08 DIAGNOSIS — K5900 Constipation, unspecified: Secondary | ICD-10-CM | POA: Diagnosis not present

## 2018-09-08 DIAGNOSIS — H04122 Dry eye syndrome of left lacrimal gland: Secondary | ICD-10-CM | POA: Diagnosis not present

## 2018-09-08 DIAGNOSIS — H1032 Unspecified acute conjunctivitis, left eye: Secondary | ICD-10-CM | POA: Diagnosis not present

## 2018-09-08 DIAGNOSIS — M6281 Muscle weakness (generalized): Secondary | ICD-10-CM | POA: Diagnosis not present

## 2018-09-08 DIAGNOSIS — J9600 Acute respiratory failure, unspecified whether with hypoxia or hypercapnia: Secondary | ICD-10-CM | POA: Diagnosis not present

## 2018-09-08 DIAGNOSIS — R21 Rash and other nonspecific skin eruption: Secondary | ICD-10-CM | POA: Diagnosis not present

## 2018-09-08 DIAGNOSIS — R600 Localized edema: Secondary | ICD-10-CM | POA: Diagnosis not present

## 2018-09-08 DIAGNOSIS — E785 Hyperlipidemia, unspecified: Secondary | ICD-10-CM | POA: Diagnosis not present

## 2018-09-08 DIAGNOSIS — I10 Essential (primary) hypertension: Secondary | ICD-10-CM | POA: Diagnosis not present

## 2018-09-08 DIAGNOSIS — R197 Diarrhea, unspecified: Secondary | ICD-10-CM | POA: Diagnosis not present

## 2018-09-14 ENCOUNTER — Encounter: Payer: Self-pay | Admitting: Pulmonary Disease

## 2018-09-14 ENCOUNTER — Ambulatory Visit (INDEPENDENT_AMBULATORY_CARE_PROVIDER_SITE_OTHER): Payer: Medicare Other | Admitting: Pulmonary Disease

## 2018-09-14 VITALS — BP 110/70 | HR 72

## 2018-09-14 DIAGNOSIS — R911 Solitary pulmonary nodule: Secondary | ICD-10-CM

## 2018-09-14 DIAGNOSIS — R918 Other nonspecific abnormal finding of lung field: Secondary | ICD-10-CM | POA: Diagnosis not present

## 2018-09-14 NOTE — Patient Instructions (Signed)
Pulmonary Nodule Repeat CT Chest without contrast (1.42mm cuts)   Chronic hypoxemic respiratory failure Wean oxygen for goal saturations 88-92% START Duonebs nebulizer three times daily CONTINUE Albuterol nebulizer twice daily as needed Encourage airway clearance maneuvers including: Incentive Spirometry

## 2018-09-14 NOTE — Progress Notes (Addendum)
Subjective:   PATIENT ID: William Skinner GENDER: male DOB: 16-Mar-1926, MRN: [id number]   HPI  Chief Complaint  Patient presents with  . Consult    Referred by Dr. Posey Pronto. Seen in ED 01/19 for PNE. Consult requested by family. Daughter in law states he was doing PT and he kept desaturating and ended up in ED. She reports he continues to desat with oxygen on with exertion. Currently using 4L with exertion.      Reason for Visit: New patient visit; hospital follow-up  [name] is a 85 year-old male with recent left posterior MCA stroke on 08/21/18 not on anticoagulation, chronic diastolic heart failure, carotid artery occlusion status post CEA who presents for hospital follow-up and to establish care.  Daughter-in-law is present and provides history as noted below.  Discharge summary reviewed and summarized as follows: Patient hospitalized from 08/06/2018 to 08/10/2018 after presenting for fall and dyspnea.  He has had dyspnea for 2 weeks associated with cough and wheezing.  He was admitted for new onset hypoxemia and treated with antibiotics, nebulizers and diuretics.  Work-up included CTA which was negative for PE but did note right lower lobe consolidation and a left upper lobe nodule.  He was discharged on oxygen and scheduled for outpatient pulmonary follow-up.  Since discharge, he remains on supplemental oxygen at his SNF however his daughter reports O2 saturations at 100% on 4 L. He reports shortness of breath and wheezing improved after being on nebulizer therapy. He now is being given nebulizers as needed and is starting to feel more short of breath on mild exertion.  Denies cough, sputum production, cheat pain or lower extremity edema.  Social History: 10-pack-year smoking history. Environmental exposures: None  I have personally reviewed patient's past medical/family/social history, allergies, current medications.  Past Medical History:  Diagnosis Date  . Back pain   .  Carotid artery occlusion   . Hyperlipidemia   . Hypertension   . Primary osteoarthritis of knee    right  . Right leg pain      Family History  Problem Relation Age of Onset  . Diabetes Mellitus II Neg Hx      Social History   Occupational History  . Not on file  Tobacco Use  . Smoking status: Former Smoker    Packs/day: 0.50    Years: 20.00    Pack years: 10.00    Last attempt to quit: 03/06/1964    Years since quitting: 54.5  . Smokeless tobacco: Never Used  Substance and Sexual Activity  . Alcohol use: Yes    Comment: occasional beer  . Drug use: No  . Sexual activity: Not on file    No Known Allergies   Outpatient Medications Prior to Visit  Medication Sig Dispense Refill  . acetaminophen (TYLENOL) 500 MG tablet Take 1,000 mg by mouth 3 (three) times daily as needed for moderate pain or headache. Reported on 07/29/2015    . albuterol (PROVENTIL HFA;VENTOLIN HFA) 108 (90 Base) MCG/ACT inhaler Inhale 2 puffs into the lungs every 6 (six) hours as needed (acute respiratory failure).     Marland Kitchen albuterol (PROVENTIL) (2.5 MG/3ML) 0.083% nebulizer solution Take 2.5 mg by nebulization every 4 (four) hours as needed for wheezing (cough).     Marland Kitchen aspirin 325 MG tablet Take 1 tablet (325 mg total) by mouth daily. 30 tablet 1  . docusate sodium (COLACE) 100 MG capsule Take 1 capsule (100 mg total) by mouth 2 (two) times  daily as needed for mild constipation.    . feeding supplement, ENSURE ENLIVE, (ENSURE ENLIVE) LIQD Take 237 mLs by mouth 2 (two) times daily between meals. 237 mL 12  . loperamide (IMODIUM A-D) 2 MG capsule Take 1 capsule (2 mg total) by mouth every 4 (four) hours as needed for diarrhea or loose stools. 30 capsule 0  . losartan (COZAAR) 100 MG tablet Take 100 mg by mouth daily.    . Multiple Vitamins-Minerals (CERTAVITE/ANTIOXIDANTS PO) Take 1 tablet by mouth daily.    . Nutritional Supplements (RESOURCE 2.0) LIQD Take 237 mLs by mouth 2 (two) times daily between meals.  Vanilla flavor    . ondansetron (ZOFRAN) 4 MG tablet Take 1 tablet (4 mg total) by mouth every 8 (eight) hours as needed for nausea or vomiting. 40 tablet 0  . furosemide (LASIX) 40 MG tablet Take 1 tablet (40 mg total) by mouth daily for 6 days. (Patient taking differently: Take 40 mg by mouth 2 (two) times daily. Hold for systolic <037) 30 tablet   . clobetasol cream (TEMOVATE) 0.48 % Apply 1 application topically 2 (two) times daily. Apply daily or bid to Bilteral lower extremity rash. (Patient not taking: Reported on 09/14/2018) 30 g 0   No facility-administered medications prior to visit.     Review of Systems  Constitutional: Negative for chills, diaphoresis, fever, malaise/fatigue and weight loss.  HENT: Negative for congestion, ear pain and sore throat.   Eyes: Negative for blurred vision.  Respiratory: Positive for shortness of breath. Negative for cough, hemoptysis, sputum production and wheezing.   Cardiovascular: Negative for chest pain, palpitations and leg swelling.  Gastrointestinal: Negative for abdominal pain, heartburn and nausea.  Genitourinary: Negative for frequency.  Musculoskeletal: Negative for joint pain and myalgias.  Skin: Negative for itching and rash.  Neurological: Positive for focal weakness (right side) and weakness. Negative for dizziness and headaches.  Endo/Heme/Allergies: Does not bruise/bleed easily.  Psychiatric/Behavioral: Negative for depression. The patient is not nervous/anxious.      Objective:   Vitals:   09/14/18 1600  BP: 110/70  Pulse: 72  SpO2: 99%   SpO2: 99 % O2 Device: Nasal cannula O2 Flow Rate (L/min): 4 L/min O2 Type: Continuous O2  Physical Exam: General: Well-appearing, no acute distress HENT: [place], AT, OP clear, MMM Eyes: Left ophthalmoplegia with mild conjunctival injection, EOMI, no scleral icterus Respiratory: Decreased breath sounds bilaterally.  No crackles, wheezing or rales Cardiovascular: RRR, -M/R/G, no JVD GI:  BS+, soft, nontender Extremities:-Edema,-tenderness Neuro: AAO x4, 4/5 right hemiparesis Skin: Intact, no rashes or bruising Psych: Normal mood, normal affect  Data Reviewed:  Imaging:  CTA 08/06/18-no PE, irregular left upper lobe nodule 9x9  PFT: None on file  Labs: CBC and CMP on 08/21/18 reviewed. Unremarkable except for BUN/Cr 37/1.32  Imaging, labs and tests noted above have been reviewed independently by me.    Assessment & Plan:   Discussion: 85 year old male with hx CVA with mild residual right hemiparesis who presents for follow-up for recent pneumonia and pulmonary lung nodule. Not in active infection. Discussed goals of care for pulmonary nodule including whether patient would be candidate for lung cancer treatment. After discussion with patient and family, will repeat CT and re-discuss options at next visit.  Pulmonary Nodule Repeat CT Chest without contrast (1.75mm cuts)   Chronic hypoxemic respiratory failure Dyspnea Wean oxygen for goal saturations 88-92% START Duonebs nebulizer three times daily CONTINUE Albuterol nebulizer twice daily as needed Encourage airway clearance maneuvers  including: Incentive Spirometry   Health Maintenance Pneumonia Due if not received. Influenza 04/2018  Orders Placed This Encounter  Procedures  . CT Chest Wo Contrast    46mm cuts    Standing Status:   Future    Standing Expiration Date:   11/13/2019    Scheduling Instructions:     Please schedule the week of March 16th at [facility]. Please contact daughter to schedule at her request.    Order Specific Question:   Preferred imaging location?    Answer:   [place]    Order Specific Question:   Radiology Contrast Protocol - do NOT remove file path    Answer:   [email or web address]  No orders of the defined types were placed in this encounter.   Return in about 1 month (around 10/13/2018).   Entire 45-minute office visit was spent  face-to-face in counseling with the patient/family. We discussed medical diagnosis and treatment plan as noted.  [place], MD [facility] 09/16/2018 3:14 PM  Office Number [phone]

## 2018-09-16 DIAGNOSIS — R911 Solitary pulmonary nodule: Secondary | ICD-10-CM | POA: Insufficient documentation

## 2018-09-20 ENCOUNTER — Ambulatory Visit (HOSPITAL_COMMUNITY): Payer: Medicare Other

## 2018-09-20 DIAGNOSIS — R062 Wheezing: Secondary | ICD-10-CM | POA: Diagnosis not present

## 2018-09-20 DIAGNOSIS — R600 Localized edema: Secondary | ICD-10-CM | POA: Diagnosis not present

## 2018-09-20 DIAGNOSIS — M6281 Muscle weakness (generalized): Secondary | ICD-10-CM | POA: Diagnosis not present

## 2018-09-20 DIAGNOSIS — I10 Essential (primary) hypertension: Secondary | ICD-10-CM | POA: Diagnosis not present

## 2018-09-20 DIAGNOSIS — K5900 Constipation, unspecified: Secondary | ICD-10-CM | POA: Diagnosis not present

## 2018-09-20 DIAGNOSIS — J9600 Acute respiratory failure, unspecified whether with hypoxia or hypercapnia: Secondary | ICD-10-CM | POA: Diagnosis not present

## 2018-09-20 DIAGNOSIS — E785 Hyperlipidemia, unspecified: Secondary | ICD-10-CM | POA: Diagnosis not present

## 2018-09-20 DIAGNOSIS — M179 Osteoarthritis of knee, unspecified: Secondary | ICD-10-CM | POA: Diagnosis not present

## 2018-09-20 DIAGNOSIS — H1032 Unspecified acute conjunctivitis, left eye: Secondary | ICD-10-CM | POA: Diagnosis not present

## 2018-09-20 DIAGNOSIS — I63312 Cerebral infarction due to thrombosis of left middle cerebral artery: Secondary | ICD-10-CM | POA: Diagnosis not present

## 2018-09-20 DIAGNOSIS — R197 Diarrhea, unspecified: Secondary | ICD-10-CM | POA: Diagnosis not present

## 2018-09-20 DIAGNOSIS — R21 Rash and other nonspecific skin eruption: Secondary | ICD-10-CM | POA: Diagnosis not present

## 2018-09-22 DIAGNOSIS — R0602 Shortness of breath: Secondary | ICD-10-CM | POA: Diagnosis not present

## 2018-09-23 DIAGNOSIS — J188 Other pneumonia, unspecified organism: Secondary | ICD-10-CM | POA: Diagnosis not present

## 2018-09-23 DIAGNOSIS — I69351 Hemiplegia and hemiparesis following cerebral infarction affecting right dominant side: Secondary | ICD-10-CM | POA: Diagnosis not present

## 2018-09-23 DIAGNOSIS — J9610 Chronic respiratory failure, unspecified whether with hypoxia or hypercapnia: Secondary | ICD-10-CM | POA: Diagnosis not present

## 2018-09-24 DIAGNOSIS — I69320 Aphasia following cerebral infarction: Secondary | ICD-10-CM | POA: Diagnosis not present

## 2018-09-24 DIAGNOSIS — M1711 Unilateral primary osteoarthritis, right knee: Secondary | ICD-10-CM | POA: Diagnosis not present

## 2018-09-24 DIAGNOSIS — K5900 Constipation, unspecified: Secondary | ICD-10-CM | POA: Diagnosis not present

## 2018-09-24 DIAGNOSIS — I10 Essential (primary) hypertension: Secondary | ICD-10-CM | POA: Diagnosis not present

## 2018-09-24 DIAGNOSIS — E785 Hyperlipidemia, unspecified: Secondary | ICD-10-CM | POA: Diagnosis not present

## 2018-09-24 DIAGNOSIS — I69351 Hemiplegia and hemiparesis following cerebral infarction affecting right dominant side: Secondary | ICD-10-CM | POA: Diagnosis not present

## 2018-09-24 DIAGNOSIS — Z9981 Dependence on supplemental oxygen: Secondary | ICD-10-CM | POA: Diagnosis not present

## 2018-09-24 DIAGNOSIS — Z87891 Personal history of nicotine dependence: Secondary | ICD-10-CM | POA: Diagnosis not present

## 2018-09-24 DIAGNOSIS — Z8673 Personal history of transient ischemic attack (TIA), and cerebral infarction without residual deficits: Secondary | ICD-10-CM | POA: Diagnosis not present

## 2018-09-24 DIAGNOSIS — J189 Pneumonia, unspecified organism: Secondary | ICD-10-CM | POA: Diagnosis not present

## 2018-09-24 DIAGNOSIS — J9600 Acute respiratory failure, unspecified whether with hypoxia or hypercapnia: Secondary | ICD-10-CM | POA: Diagnosis not present

## 2018-09-25 DIAGNOSIS — J9600 Acute respiratory failure, unspecified whether with hypoxia or hypercapnia: Secondary | ICD-10-CM | POA: Diagnosis not present

## 2018-09-25 DIAGNOSIS — J189 Pneumonia, unspecified organism: Secondary | ICD-10-CM | POA: Diagnosis not present

## 2018-09-25 DIAGNOSIS — I69351 Hemiplegia and hemiparesis following cerebral infarction affecting right dominant side: Secondary | ICD-10-CM | POA: Diagnosis not present

## 2018-09-25 DIAGNOSIS — I10 Essential (primary) hypertension: Secondary | ICD-10-CM | POA: Diagnosis not present

## 2018-09-25 DIAGNOSIS — E785 Hyperlipidemia, unspecified: Secondary | ICD-10-CM | POA: Diagnosis not present

## 2018-09-25 DIAGNOSIS — M1711 Unilateral primary osteoarthritis, right knee: Secondary | ICD-10-CM | POA: Diagnosis not present

## 2018-09-27 DIAGNOSIS — Z79899 Other long term (current) drug therapy: Secondary | ICD-10-CM | POA: Diagnosis not present

## 2018-09-27 DIAGNOSIS — J984 Other disorders of lung: Secondary | ICD-10-CM | POA: Diagnosis not present

## 2018-09-27 DIAGNOSIS — R633 Feeding difficulties: Secondary | ICD-10-CM | POA: Diagnosis not present

## 2018-09-27 DIAGNOSIS — J189 Pneumonia, unspecified organism: Secondary | ICD-10-CM | POA: Diagnosis not present

## 2018-09-27 DIAGNOSIS — M170 Bilateral primary osteoarthritis of knee: Secondary | ICD-10-CM | POA: Diagnosis not present

## 2018-09-27 DIAGNOSIS — R2681 Unsteadiness on feet: Secondary | ICD-10-CM | POA: Diagnosis not present

## 2018-09-27 DIAGNOSIS — R531 Weakness: Secondary | ICD-10-CM | POA: Diagnosis not present

## 2018-09-27 DIAGNOSIS — J449 Chronic obstructive pulmonary disease, unspecified: Secondary | ICD-10-CM | POA: Diagnosis not present

## 2018-09-27 DIAGNOSIS — I639 Cerebral infarction, unspecified: Secondary | ICD-10-CM | POA: Diagnosis not present

## 2018-09-27 DIAGNOSIS — M1711 Unilateral primary osteoarthritis, right knee: Secondary | ICD-10-CM | POA: Diagnosis not present

## 2018-09-27 DIAGNOSIS — I10 Essential (primary) hypertension: Secondary | ICD-10-CM | POA: Diagnosis not present

## 2018-09-27 DIAGNOSIS — I69351 Hemiplegia and hemiparesis following cerebral infarction affecting right dominant side: Secondary | ICD-10-CM | POA: Diagnosis not present

## 2018-09-27 DIAGNOSIS — E785 Hyperlipidemia, unspecified: Secondary | ICD-10-CM | POA: Diagnosis not present

## 2018-09-27 DIAGNOSIS — J9600 Acute respiratory failure, unspecified whether with hypoxia or hypercapnia: Secondary | ICD-10-CM | POA: Diagnosis not present

## 2018-09-28 DIAGNOSIS — I69351 Hemiplegia and hemiparesis following cerebral infarction affecting right dominant side: Secondary | ICD-10-CM | POA: Diagnosis not present

## 2018-09-28 DIAGNOSIS — M1711 Unilateral primary osteoarthritis, right knee: Secondary | ICD-10-CM | POA: Diagnosis not present

## 2018-09-28 DIAGNOSIS — J189 Pneumonia, unspecified organism: Secondary | ICD-10-CM | POA: Diagnosis not present

## 2018-09-28 DIAGNOSIS — J9600 Acute respiratory failure, unspecified whether with hypoxia or hypercapnia: Secondary | ICD-10-CM | POA: Diagnosis not present

## 2018-09-28 DIAGNOSIS — I10 Essential (primary) hypertension: Secondary | ICD-10-CM | POA: Diagnosis not present

## 2018-09-28 DIAGNOSIS — E785 Hyperlipidemia, unspecified: Secondary | ICD-10-CM | POA: Diagnosis not present

## 2018-09-29 DIAGNOSIS — I10 Essential (primary) hypertension: Secondary | ICD-10-CM | POA: Diagnosis not present

## 2018-09-29 DIAGNOSIS — E785 Hyperlipidemia, unspecified: Secondary | ICD-10-CM | POA: Diagnosis not present

## 2018-09-29 DIAGNOSIS — J189 Pneumonia, unspecified organism: Secondary | ICD-10-CM | POA: Diagnosis not present

## 2018-09-29 DIAGNOSIS — M1711 Unilateral primary osteoarthritis, right knee: Secondary | ICD-10-CM | POA: Diagnosis not present

## 2018-09-29 DIAGNOSIS — J9600 Acute respiratory failure, unspecified whether with hypoxia or hypercapnia: Secondary | ICD-10-CM | POA: Diagnosis not present

## 2018-09-29 DIAGNOSIS — I69351 Hemiplegia and hemiparesis following cerebral infarction affecting right dominant side: Secondary | ICD-10-CM | POA: Diagnosis not present

## 2018-10-02 ENCOUNTER — Ambulatory Visit (HOSPITAL_COMMUNITY): Payer: Medicare Other

## 2018-10-02 DIAGNOSIS — J189 Pneumonia, unspecified organism: Secondary | ICD-10-CM | POA: Diagnosis not present

## 2018-10-02 DIAGNOSIS — M1711 Unilateral primary osteoarthritis, right knee: Secondary | ICD-10-CM | POA: Diagnosis not present

## 2018-10-02 DIAGNOSIS — I69351 Hemiplegia and hemiparesis following cerebral infarction affecting right dominant side: Secondary | ICD-10-CM | POA: Diagnosis not present

## 2018-10-02 DIAGNOSIS — E785 Hyperlipidemia, unspecified: Secondary | ICD-10-CM | POA: Diagnosis not present

## 2018-10-02 DIAGNOSIS — J9600 Acute respiratory failure, unspecified whether with hypoxia or hypercapnia: Secondary | ICD-10-CM | POA: Diagnosis not present

## 2018-10-02 DIAGNOSIS — I10 Essential (primary) hypertension: Secondary | ICD-10-CM | POA: Diagnosis not present

## 2018-10-04 DIAGNOSIS — I69351 Hemiplegia and hemiparesis following cerebral infarction affecting right dominant side: Secondary | ICD-10-CM | POA: Diagnosis not present

## 2018-10-04 DIAGNOSIS — Z79899 Other long term (current) drug therapy: Secondary | ICD-10-CM | POA: Diagnosis not present

## 2018-10-04 DIAGNOSIS — J3089 Other allergic rhinitis: Secondary | ICD-10-CM | POA: Diagnosis not present

## 2018-10-04 DIAGNOSIS — E785 Hyperlipidemia, unspecified: Secondary | ICD-10-CM | POA: Diagnosis not present

## 2018-10-04 DIAGNOSIS — R0902 Hypoxemia: Secondary | ICD-10-CM | POA: Diagnosis not present

## 2018-10-04 DIAGNOSIS — M1711 Unilateral primary osteoarthritis, right knee: Secondary | ICD-10-CM | POA: Diagnosis not present

## 2018-10-04 DIAGNOSIS — J9600 Acute respiratory failure, unspecified whether with hypoxia or hypercapnia: Secondary | ICD-10-CM | POA: Diagnosis not present

## 2018-10-04 DIAGNOSIS — I10 Essential (primary) hypertension: Secondary | ICD-10-CM | POA: Diagnosis not present

## 2018-10-04 DIAGNOSIS — J189 Pneumonia, unspecified organism: Secondary | ICD-10-CM | POA: Diagnosis not present

## 2018-10-05 DIAGNOSIS — J189 Pneumonia, unspecified organism: Secondary | ICD-10-CM | POA: Diagnosis not present

## 2018-10-05 DIAGNOSIS — I10 Essential (primary) hypertension: Secondary | ICD-10-CM | POA: Diagnosis not present

## 2018-10-05 DIAGNOSIS — E785 Hyperlipidemia, unspecified: Secondary | ICD-10-CM | POA: Diagnosis not present

## 2018-10-05 DIAGNOSIS — I69351 Hemiplegia and hemiparesis following cerebral infarction affecting right dominant side: Secondary | ICD-10-CM | POA: Diagnosis not present

## 2018-10-05 DIAGNOSIS — J9600 Acute respiratory failure, unspecified whether with hypoxia or hypercapnia: Secondary | ICD-10-CM | POA: Diagnosis not present

## 2018-10-05 DIAGNOSIS — M1711 Unilateral primary osteoarthritis, right knee: Secondary | ICD-10-CM | POA: Diagnosis not present

## 2018-10-06 DIAGNOSIS — J189 Pneumonia, unspecified organism: Secondary | ICD-10-CM | POA: Diagnosis not present

## 2018-10-06 DIAGNOSIS — E785 Hyperlipidemia, unspecified: Secondary | ICD-10-CM | POA: Diagnosis not present

## 2018-10-06 DIAGNOSIS — I69351 Hemiplegia and hemiparesis following cerebral infarction affecting right dominant side: Secondary | ICD-10-CM | POA: Diagnosis not present

## 2018-10-06 DIAGNOSIS — J9600 Acute respiratory failure, unspecified whether with hypoxia or hypercapnia: Secondary | ICD-10-CM | POA: Diagnosis not present

## 2018-10-06 DIAGNOSIS — I10 Essential (primary) hypertension: Secondary | ICD-10-CM | POA: Diagnosis not present

## 2018-10-06 DIAGNOSIS — M1711 Unilateral primary osteoarthritis, right knee: Secondary | ICD-10-CM | POA: Diagnosis not present

## 2018-10-09 ENCOUNTER — Ambulatory Visit: Payer: Medicare Other | Admitting: Pulmonary Disease

## 2018-10-09 DIAGNOSIS — E785 Hyperlipidemia, unspecified: Secondary | ICD-10-CM | POA: Diagnosis not present

## 2018-10-09 DIAGNOSIS — I69351 Hemiplegia and hemiparesis following cerebral infarction affecting right dominant side: Secondary | ICD-10-CM | POA: Diagnosis not present

## 2018-10-09 DIAGNOSIS — I10 Essential (primary) hypertension: Secondary | ICD-10-CM | POA: Diagnosis not present

## 2018-10-09 DIAGNOSIS — J189 Pneumonia, unspecified organism: Secondary | ICD-10-CM | POA: Diagnosis not present

## 2018-10-09 DIAGNOSIS — J9600 Acute respiratory failure, unspecified whether with hypoxia or hypercapnia: Secondary | ICD-10-CM | POA: Diagnosis not present

## 2018-10-09 DIAGNOSIS — M1711 Unilateral primary osteoarthritis, right knee: Secondary | ICD-10-CM | POA: Diagnosis not present

## 2018-10-10 DIAGNOSIS — I10 Essential (primary) hypertension: Secondary | ICD-10-CM | POA: Diagnosis not present

## 2018-10-10 DIAGNOSIS — I69351 Hemiplegia and hemiparesis following cerebral infarction affecting right dominant side: Secondary | ICD-10-CM | POA: Diagnosis not present

## 2018-10-10 DIAGNOSIS — J189 Pneumonia, unspecified organism: Secondary | ICD-10-CM | POA: Diagnosis not present

## 2018-10-10 DIAGNOSIS — M1711 Unilateral primary osteoarthritis, right knee: Secondary | ICD-10-CM | POA: Diagnosis not present

## 2018-10-10 DIAGNOSIS — E785 Hyperlipidemia, unspecified: Secondary | ICD-10-CM | POA: Diagnosis not present

## 2018-10-10 DIAGNOSIS — J9600 Acute respiratory failure, unspecified whether with hypoxia or hypercapnia: Secondary | ICD-10-CM | POA: Diagnosis not present

## 2018-10-11 DIAGNOSIS — I10 Essential (primary) hypertension: Secondary | ICD-10-CM | POA: Diagnosis not present

## 2018-10-11 DIAGNOSIS — J189 Pneumonia, unspecified organism: Secondary | ICD-10-CM | POA: Diagnosis not present

## 2018-10-11 DIAGNOSIS — E785 Hyperlipidemia, unspecified: Secondary | ICD-10-CM | POA: Diagnosis not present

## 2018-10-11 DIAGNOSIS — M1711 Unilateral primary osteoarthritis, right knee: Secondary | ICD-10-CM | POA: Diagnosis not present

## 2018-10-11 DIAGNOSIS — I69351 Hemiplegia and hemiparesis following cerebral infarction affecting right dominant side: Secondary | ICD-10-CM | POA: Diagnosis not present

## 2018-10-11 DIAGNOSIS — J9600 Acute respiratory failure, unspecified whether with hypoxia or hypercapnia: Secondary | ICD-10-CM | POA: Diagnosis not present

## 2018-10-13 DIAGNOSIS — I10 Essential (primary) hypertension: Secondary | ICD-10-CM | POA: Diagnosis not present

## 2018-10-13 DIAGNOSIS — I69351 Hemiplegia and hemiparesis following cerebral infarction affecting right dominant side: Secondary | ICD-10-CM | POA: Diagnosis not present

## 2018-10-13 DIAGNOSIS — E785 Hyperlipidemia, unspecified: Secondary | ICD-10-CM | POA: Diagnosis not present

## 2018-10-13 DIAGNOSIS — M1711 Unilateral primary osteoarthritis, right knee: Secondary | ICD-10-CM | POA: Diagnosis not present

## 2018-10-13 DIAGNOSIS — J189 Pneumonia, unspecified organism: Secondary | ICD-10-CM | POA: Diagnosis not present

## 2018-10-13 DIAGNOSIS — J9600 Acute respiratory failure, unspecified whether with hypoxia or hypercapnia: Secondary | ICD-10-CM | POA: Diagnosis not present

## 2018-10-16 DIAGNOSIS — M1711 Unilateral primary osteoarthritis, right knee: Secondary | ICD-10-CM | POA: Diagnosis not present

## 2018-10-16 DIAGNOSIS — E785 Hyperlipidemia, unspecified: Secondary | ICD-10-CM | POA: Diagnosis not present

## 2018-10-16 DIAGNOSIS — J189 Pneumonia, unspecified organism: Secondary | ICD-10-CM | POA: Diagnosis not present

## 2018-10-16 DIAGNOSIS — J9600 Acute respiratory failure, unspecified whether with hypoxia or hypercapnia: Secondary | ICD-10-CM | POA: Diagnosis not present

## 2018-10-16 DIAGNOSIS — I69351 Hemiplegia and hemiparesis following cerebral infarction affecting right dominant side: Secondary | ICD-10-CM | POA: Diagnosis not present

## 2018-10-16 DIAGNOSIS — I10 Essential (primary) hypertension: Secondary | ICD-10-CM | POA: Diagnosis not present

## 2018-10-17 DIAGNOSIS — J189 Pneumonia, unspecified organism: Secondary | ICD-10-CM | POA: Diagnosis not present

## 2018-10-17 DIAGNOSIS — M1711 Unilateral primary osteoarthritis, right knee: Secondary | ICD-10-CM | POA: Diagnosis not present

## 2018-10-17 DIAGNOSIS — I10 Essential (primary) hypertension: Secondary | ICD-10-CM | POA: Diagnosis not present

## 2018-10-17 DIAGNOSIS — J9600 Acute respiratory failure, unspecified whether with hypoxia or hypercapnia: Secondary | ICD-10-CM | POA: Diagnosis not present

## 2018-10-17 DIAGNOSIS — E785 Hyperlipidemia, unspecified: Secondary | ICD-10-CM | POA: Diagnosis not present

## 2018-10-17 DIAGNOSIS — I69351 Hemiplegia and hemiparesis following cerebral infarction affecting right dominant side: Secondary | ICD-10-CM | POA: Diagnosis not present

## 2018-10-24 DIAGNOSIS — J189 Pneumonia, unspecified organism: Secondary | ICD-10-CM | POA: Diagnosis not present

## 2018-10-24 DIAGNOSIS — Z87891 Personal history of nicotine dependence: Secondary | ICD-10-CM | POA: Diagnosis not present

## 2018-11-15 DIAGNOSIS — I10 Essential (primary) hypertension: Secondary | ICD-10-CM | POA: Diagnosis not present

## 2018-11-15 DIAGNOSIS — Z79899 Other long term (current) drug therapy: Secondary | ICD-10-CM | POA: Diagnosis not present

## 2018-11-15 DIAGNOSIS — J439 Emphysema, unspecified: Secondary | ICD-10-CM | POA: Diagnosis not present

## 2018-11-15 DIAGNOSIS — R21 Rash and other nonspecific skin eruption: Secondary | ICD-10-CM | POA: Diagnosis not present

## 2018-11-23 DIAGNOSIS — I69351 Hemiplegia and hemiparesis following cerebral infarction affecting right dominant side: Secondary | ICD-10-CM | POA: Diagnosis not present

## 2018-11-23 DIAGNOSIS — I10 Essential (primary) hypertension: Secondary | ICD-10-CM | POA: Diagnosis not present

## 2018-11-28 ENCOUNTER — Other Ambulatory Visit: Payer: Self-pay

## 2018-11-28 DIAGNOSIS — I10 Essential (primary) hypertension: Secondary | ICD-10-CM | POA: Diagnosis not present

## 2018-11-28 DIAGNOSIS — J439 Emphysema, unspecified: Secondary | ICD-10-CM | POA: Diagnosis not present

## 2018-11-28 NOTE — Patient Outreach (Signed)
Telephone outreach to patient to obtain mRs was successfully completed. mRs= 4. Spoke with daughter (on [place]) to obtain score.

## 2018-11-28 NOTE — Telephone Encounter (Signed)
This encounter was created in error - please disregard.

## 2018-11-28 NOTE — Patient Outreach (Signed)
First attempt to obtain mRs. No answer+busy signal on home phone. Left message for return call on cell phone.

## 2018-12-06 DIAGNOSIS — I10 Essential (primary) hypertension: Secondary | ICD-10-CM | POA: Diagnosis not present

## 2018-12-06 DIAGNOSIS — J439 Emphysema, unspecified: Secondary | ICD-10-CM | POA: Diagnosis not present

## 2018-12-06 DIAGNOSIS — I639 Cerebral infarction, unspecified: Secondary | ICD-10-CM | POA: Diagnosis not present

## 2018-12-06 DIAGNOSIS — M170 Bilateral primary osteoarthritis of knee: Secondary | ICD-10-CM | POA: Diagnosis not present

## 2018-12-06 DIAGNOSIS — Z0000 Encounter for general adult medical examination without abnormal findings: Secondary | ICD-10-CM | POA: Diagnosis not present

## 2018-12-13 DIAGNOSIS — I959 Hypotension, unspecified: Secondary | ICD-10-CM | POA: Diagnosis not present

## 2018-12-13 DIAGNOSIS — J439 Emphysema, unspecified: Secondary | ICD-10-CM | POA: Diagnosis not present

## 2018-12-13 DIAGNOSIS — J302 Other seasonal allergic rhinitis: Secondary | ICD-10-CM | POA: Diagnosis not present

## 2018-12-13 DIAGNOSIS — I10 Essential (primary) hypertension: Secondary | ICD-10-CM | POA: Diagnosis not present

## 2018-12-13 DIAGNOSIS — Z79899 Other long term (current) drug therapy: Secondary | ICD-10-CM | POA: Diagnosis not present

## 2018-12-19 DIAGNOSIS — Z20828 Contact with and (suspected) exposure to other viral communicable diseases: Secondary | ICD-10-CM | POA: Diagnosis not present

## 2018-12-24 DIAGNOSIS — E785 Hyperlipidemia, unspecified: Secondary | ICD-10-CM | POA: Diagnosis not present

## 2018-12-24 DIAGNOSIS — Z79899 Other long term (current) drug therapy: Secondary | ICD-10-CM | POA: Diagnosis not present

## 2019-01-10 DIAGNOSIS — J439 Emphysema, unspecified: Secondary | ICD-10-CM | POA: Diagnosis not present

## 2019-01-10 DIAGNOSIS — I10 Essential (primary) hypertension: Secondary | ICD-10-CM | POA: Diagnosis not present

## 2019-01-10 DIAGNOSIS — Z79899 Other long term (current) drug therapy: Secondary | ICD-10-CM | POA: Diagnosis not present

## 2019-01-17 DIAGNOSIS — I10 Essential (primary) hypertension: Secondary | ICD-10-CM | POA: Diagnosis not present

## 2019-01-17 DIAGNOSIS — Z79899 Other long term (current) drug therapy: Secondary | ICD-10-CM | POA: Diagnosis not present

## 2019-01-17 DIAGNOSIS — R21 Rash and other nonspecific skin eruption: Secondary | ICD-10-CM | POA: Diagnosis not present

## 2019-01-31 DIAGNOSIS — Z09 Encounter for follow-up examination after completed treatment for conditions other than malignant neoplasm: Secondary | ICD-10-CM | POA: Diagnosis not present

## 2019-01-31 DIAGNOSIS — R21 Rash and other nonspecific skin eruption: Secondary | ICD-10-CM | POA: Diagnosis not present

## 2019-01-31 DIAGNOSIS — Z79899 Other long term (current) drug therapy: Secondary | ICD-10-CM | POA: Diagnosis not present

## 2019-03-14 DIAGNOSIS — I10 Essential (primary) hypertension: Secondary | ICD-10-CM | POA: Diagnosis not present

## 2019-03-14 DIAGNOSIS — Z79899 Other long term (current) drug therapy: Secondary | ICD-10-CM | POA: Diagnosis not present

## 2019-03-14 DIAGNOSIS — H5789 Other specified disorders of eye and adnexa: Secondary | ICD-10-CM | POA: Diagnosis not present

## 2019-03-14 DIAGNOSIS — J3089 Other allergic rhinitis: Secondary | ICD-10-CM | POA: Diagnosis not present

## 2019-04-03 DIAGNOSIS — Z20828 Contact with and (suspected) exposure to other viral communicable diseases: Secondary | ICD-10-CM | POA: Diagnosis not present

## 2019-04-10 DIAGNOSIS — Z20828 Contact with and (suspected) exposure to other viral communicable diseases: Secondary | ICD-10-CM | POA: Diagnosis not present

## 2019-04-11 DIAGNOSIS — I10 Essential (primary) hypertension: Secondary | ICD-10-CM | POA: Diagnosis not present

## 2019-04-11 DIAGNOSIS — Z79899 Other long term (current) drug therapy: Secondary | ICD-10-CM | POA: Diagnosis not present

## 2019-04-11 DIAGNOSIS — J449 Chronic obstructive pulmonary disease, unspecified: Secondary | ICD-10-CM | POA: Diagnosis not present

## 2019-04-11 DIAGNOSIS — J309 Allergic rhinitis, unspecified: Secondary | ICD-10-CM | POA: Diagnosis not present

## 2019-04-11 DIAGNOSIS — S90221A Contusion of right lesser toe(s) with damage to nail, initial encounter: Secondary | ICD-10-CM | POA: Diagnosis not present

## 2019-04-17 DIAGNOSIS — Z20828 Contact with and (suspected) exposure to other viral communicable diseases: Secondary | ICD-10-CM | POA: Diagnosis not present

## 2019-04-24 DIAGNOSIS — Z20828 Contact with and (suspected) exposure to other viral communicable diseases: Secondary | ICD-10-CM | POA: Diagnosis not present

## 2019-05-14 DIAGNOSIS — I739 Peripheral vascular disease, unspecified: Secondary | ICD-10-CM | POA: Diagnosis not present

## 2019-05-14 DIAGNOSIS — B351 Tinea unguium: Secondary | ICD-10-CM | POA: Diagnosis not present

## 2019-06-01 IMAGING — CT CT ANGIO NECK
2 of 9 series · 8 of 46 positions shown, 13 images · IV contrast (OMNI)
Comparison: None.

CLINICAL DATA: Acute onset right arm and leg weakness and
difficulty speaking.

EXAM:
CT ANGIOGRAPHY HEAD AND NECK
TECHNIQUE: Multidetector CT imaging of the head and neck was performed using
the standard protocol during bolus administration of intravenous
contrast. Multiplanar CT image reconstructions and MIPs were
obtained to evaluate the vascular anatomy. Carotid stenosis
measurements (when applicable) are obtained utilizing NASCET
criteria, using the distal internal carotid diameter as the
denominator.
CONTRAST:  75mL UISCTA-A4S IOPAMIDOL (UISCTA-A4S) INJECTION 76%

[Series 5: carotid/brain 2.0 i30f 3 · axial · 0.47mm/px · z∈[-266,+14]mm · 6 of 197 slices shown, 11 images]
[im 29/197  soft-tissue]
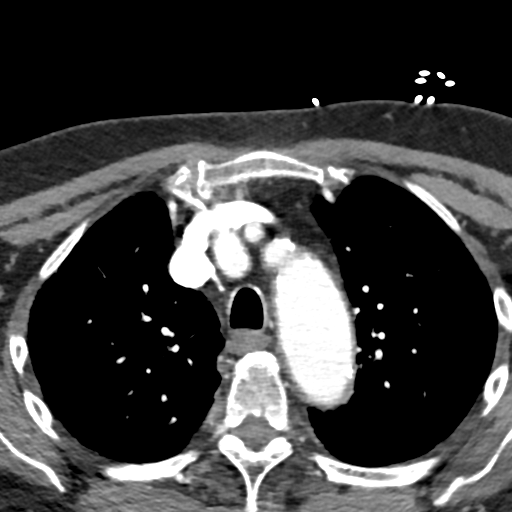
[im 29/197  bone]
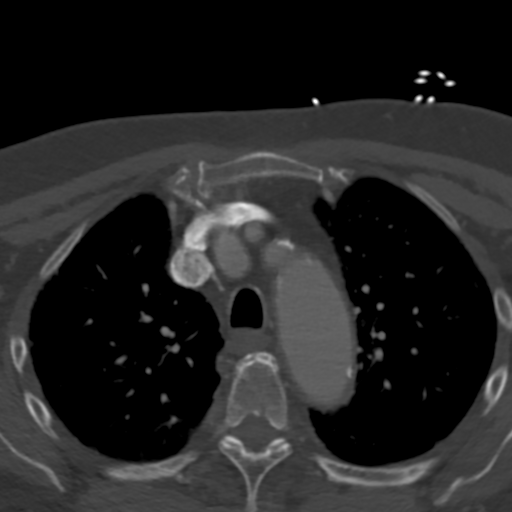
[im 57/197  soft-tissue]
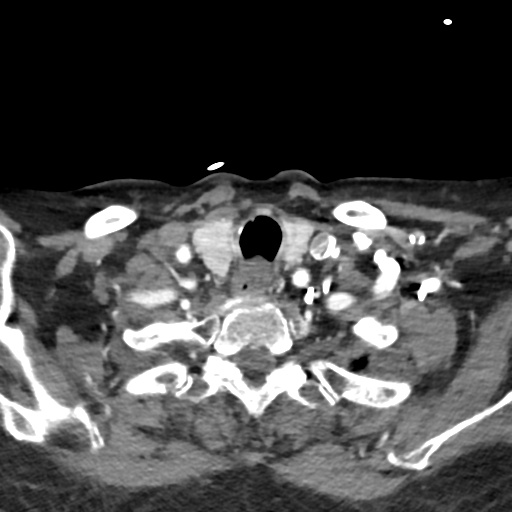
[im 85/197  soft-tissue]
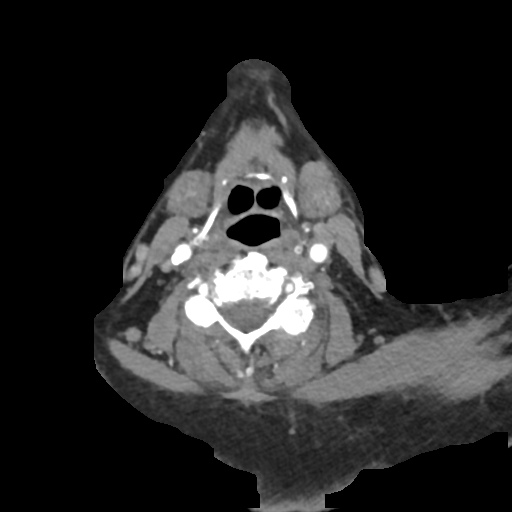
[im 85/197  lung]
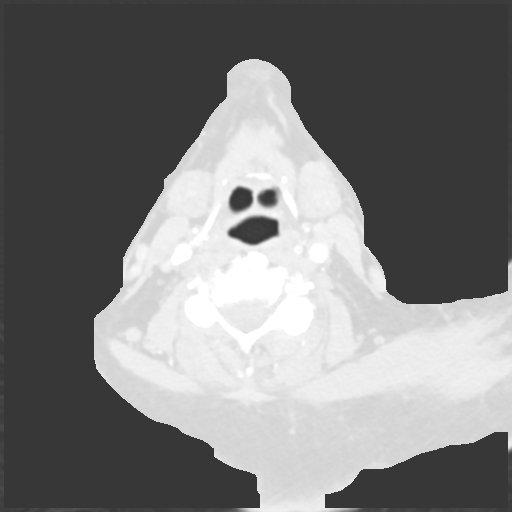
[im 113/197  soft-tissue]
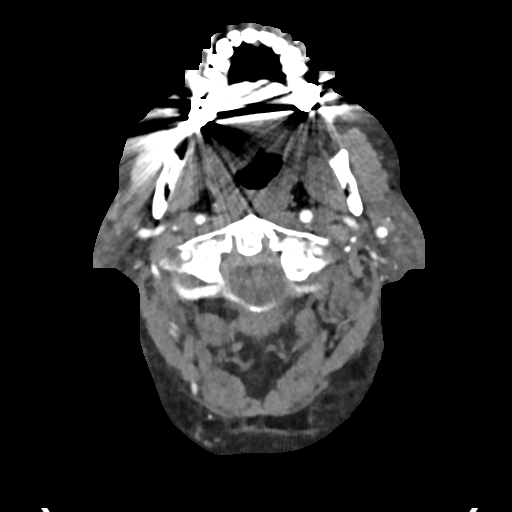
[im 113/197  lung]
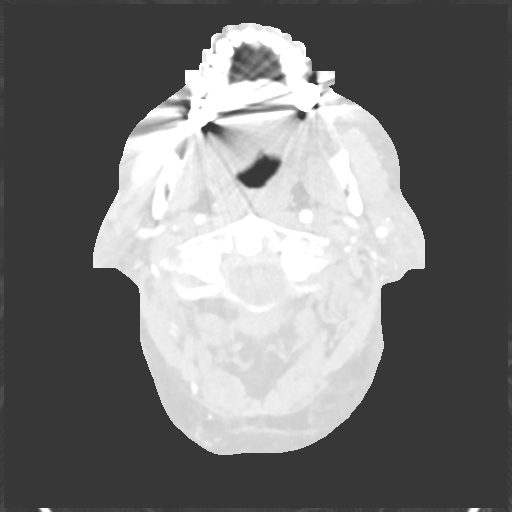
[im 141/197  soft-tissue]
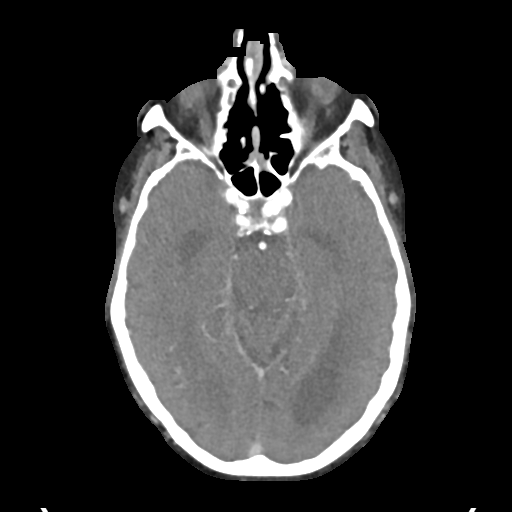
[im 141/197  lung]
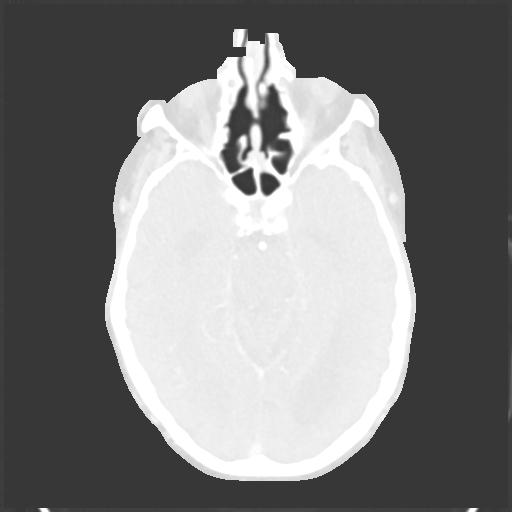
[im 169/197  soft-tissue]
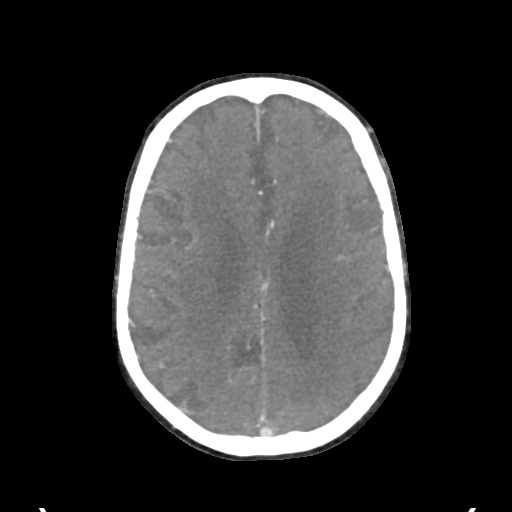
[im 169/197  lung]
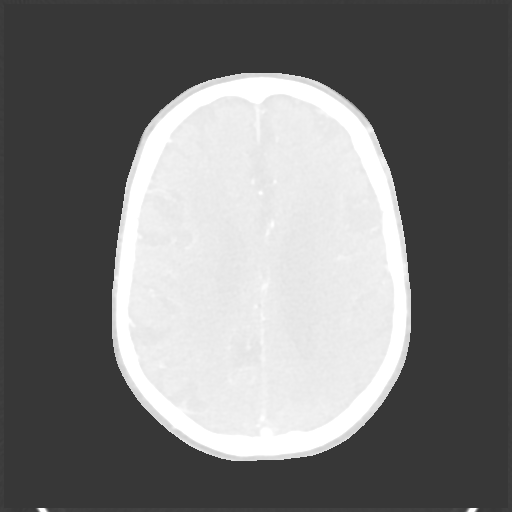

[Series 11: carotid mips (id) · axial · 0.49mm/px · z∈[-197,-72]mm · 2 of 77 slices shown]
[im 26/77  soft-tissue]
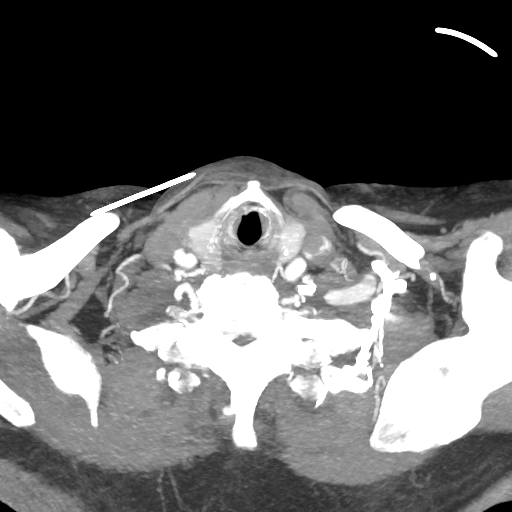
[im 51/77  soft-tissue]
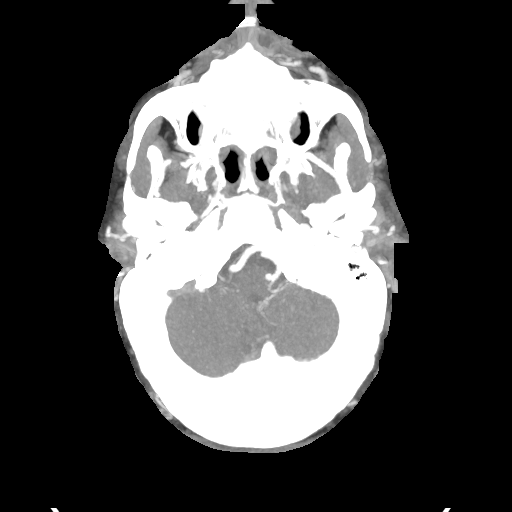

[8 of 46 positions shown; findings below may reference images not displayed]

FINDINGS: CTA NECK FINDINGS

Aortic arch: Standard 3 vessel aortic arch. Prominent irregular soft
plaque in the aortic arch. No arch vessel origin stenosis.

Right carotid system: Patent with predominantly calcified plaque at
the carotid bifurcation and in the proximal ICA without significant
stenosis. Mild beading of the distal cervical ICA.

Left carotid system: Patent with evidence of prior endarterectomy.
No significant stenosis or dissection.

Vertebral arteries: Patent with the right being mildly dominant. No
significant stenosis or dissection.

Skeleton: Moderate to severe disc and asymmetric left-sided facet
arthrosis in the cervical spine.

Other neck: No evidence of acute abnormality or mass.

Upper chest: Mild biapical pleuroparenchymal scarring.

Review of the MIP images confirms the above findings

CTA HEAD FINDINGS

Anterior circulation: The internal carotid arteries are patent from
skull base to carotid termini with mild-to-moderate atherosclerotic
plaque bilaterally not resulting in significant stenosis. The right
A1 segment is absent. The left A1 and bilateral M1 segments are
widely patent. There is proximal to mid left M2 inferior division
occlusion with mild distal collateralization. No aneurysm is
identified.

Posterior circulation: The intracranial vertebral arteries are
patent to the basilar with mild calcified plaque on the right not
resulting in significant stenosis. Patent left PICA, right AICA, and
bilateral SCA origins are identified. The basilar artery is widely
patent. PCAs are patent with atherosclerotic irregularity
bilaterally most notably involving the branch vessels without
evidence of flow limiting proximal stenosis. No aneurysm is
identified.

Venous sinuses: As permitted by contrast timing, patent.

Anatomic variants: Absent right A1.

Review of the MIP images confirms the above findings
IMPRESSION: 1. Proximal to mid left M2 inferior division branch occlusion.
2. Intracranial atherosclerosis without significant proximal
stenosis.
3. Patent cervical carotid and vertebral arteries without
significant stenosis.
4.  Aortic Atherosclerosis ([id number]).

These results were communicated to Dr. Cristove at [DATE] on
08/21/2018 by text page via the AMION messaging system.

## 2019-06-06 DIAGNOSIS — I10 Essential (primary) hypertension: Secondary | ICD-10-CM | POA: Diagnosis not present

## 2019-06-06 DIAGNOSIS — J439 Emphysema, unspecified: Secondary | ICD-10-CM | POA: Diagnosis not present

## 2019-06-06 DIAGNOSIS — Z79899 Other long term (current) drug therapy: Secondary | ICD-10-CM | POA: Diagnosis not present

## 2019-06-06 DIAGNOSIS — M170 Bilateral primary osteoarthritis of knee: Secondary | ICD-10-CM | POA: Diagnosis not present

## 2019-06-24 ENCOUNTER — Emergency Department (HOSPITAL_COMMUNITY): Payer: Medicare Other

## 2019-06-24 ENCOUNTER — Emergency Department (HOSPITAL_COMMUNITY)
Admission: EM | Admit: 2019-06-24 | Discharge: 2019-06-25 | Disposition: A | Discharge status: Home / Self Care | Payer: Medicare Other | Attending: Emergency Medicine | Admitting: Emergency Medicine

## 2019-06-24 ENCOUNTER — Other Ambulatory Visit: Payer: Self-pay

## 2019-06-24 ENCOUNTER — Encounter (HOSPITAL_COMMUNITY): Payer: Self-pay | Admitting: Emergency Medicine

## 2019-06-24 DIAGNOSIS — R0902 Hypoxemia: Secondary | ICD-10-CM | POA: Diagnosis not present

## 2019-06-24 DIAGNOSIS — Z8673 Personal history of transient ischemic attack (TIA), and cerebral infarction without residual deficits: Secondary | ICD-10-CM | POA: Diagnosis not present

## 2019-06-24 DIAGNOSIS — R531 Weakness: Secondary | ICD-10-CM | POA: Diagnosis not present

## 2019-06-24 DIAGNOSIS — F0390 Unspecified dementia without behavioral disturbance: Secondary | ICD-10-CM | POA: Insufficient documentation

## 2019-06-24 DIAGNOSIS — R7401 Elevation of levels of liver transaminase levels: Secondary | ICD-10-CM | POA: Diagnosis not present

## 2019-06-24 DIAGNOSIS — Z96651 Presence of right artificial knee joint: Secondary | ICD-10-CM | POA: Insufficient documentation

## 2019-06-24 DIAGNOSIS — Z7982 Long term (current) use of aspirin: Secondary | ICD-10-CM | POA: Insufficient documentation

## 2019-06-24 DIAGNOSIS — Z20828 Contact with and (suspected) exposure to other viral communicable diseases: Secondary | ICD-10-CM | POA: Insufficient documentation

## 2019-06-24 DIAGNOSIS — Z79899 Other long term (current) drug therapy: Secondary | ICD-10-CM | POA: Diagnosis not present

## 2019-06-24 DIAGNOSIS — E1165 Type 2 diabetes mellitus with hyperglycemia: Secondary | ICD-10-CM | POA: Diagnosis not present

## 2019-06-24 DIAGNOSIS — I959 Hypotension, unspecified: Secondary | ICD-10-CM | POA: Diagnosis not present

## 2019-06-24 DIAGNOSIS — I10 Essential (primary) hypertension: Secondary | ICD-10-CM | POA: Insufficient documentation

## 2019-06-24 DIAGNOSIS — Z87891 Personal history of nicotine dependence: Secondary | ICD-10-CM | POA: Insufficient documentation

## 2019-06-24 LAB — CBC WITH DIFFERENTIAL/PLATELET
Abs Immature Granulocytes: 0.03 10*3/uL (ref 0.00–0.07)
Basophils Absolute: 0 10*3/uL (ref 0.0–0.1)
Basophils Relative: 0 %
Eosinophils Absolute: 0.1 10*3/uL (ref 0.0–0.5)
Eosinophils Relative: 1 %
HCT: 45.5 % (ref 39.0–52.0)
Hemoglobin: 14.6 g/dL (ref 13.0–17.0)
Immature Granulocytes: 0 %
Lymphocytes Relative: 14 %
Lymphs Abs: 1.2 10*3/uL (ref 0.7–4.0)
MCH: 31.4 pg (ref 26.0–34.0)
MCHC: 32.1 g/dL (ref 30.0–36.0)
MCV: 97.8 fL (ref 80.0–100.0)
Monocytes Absolute: 0.8 10*3/uL (ref 0.1–1.0)
Monocytes Relative: 10 %
Neutro Abs: 6.2 10*3/uL (ref 1.7–7.7)
Neutrophils Relative %: 75 %
Platelets: 226 10*3/uL (ref 150–400)
RBC: 4.65 MIL/uL (ref 4.22–5.81)
RDW: 12.6 % (ref 11.5–15.5)
WBC: 8.3 10*3/uL (ref 4.0–10.5)
nRBC: 0 % (ref 0.0–0.2)

## 2019-06-24 LAB — CBG MONITORING, ED: Glucose-Capillary: 111 mg/dL — ABNORMAL HIGH (ref 70–99)

## 2019-06-24 NOTE — ED Notes (Signed)
Condom cath placed on pt. Will continue to check for urine sample.

## 2019-06-24 NOTE — ED Triage Notes (Signed)
Pt arrived via EMS. EMS reports nursing home states pt has had generalized weakness since this AM. Pt has tremor per EMS, nursing staff at facility says that this is pt's baseline.

## 2019-06-24 NOTE — ED Provider Notes (Signed)
[facility] DEPT Provider Note   CSN: [id number] Arrival date & time: 06/24/19  2228    History   Chief Complaint Chief Complaint  Patient presents with  . Weakness    HPI William Skinner is a 85 y.o. male.   The history is provided by the nursing home and a relative. The history is limited by the condition of the patient (Dementia).  Weakness He has history of hypertension, stroke, dementia, DNR status and is transferred from skilled nursing facility because of increased weakness yesterday and today.  He was having difficulty transferring from bed to chair.  Per family member, he has been wheelchair-bound for over a year.  He did have a stroke earlier in the year with some mild right-sided deficits.  There have been no known fevers no vomiting or diarrhea.  Is on certain if he had eaten his meals and has been hydrating adequately.  Family member who is here confirms DNR status.  Past Medical History:  Diagnosis Date  . Back pain   . Carotid artery occlusion   . Hyperlipidemia   . Hypertension   . Primary osteoarthritis of knee    right  . Right leg pain     Patient Active Problem List   Diagnosis Date Noted  . Lung nodule, solitary 09/16/2018  . Acute right-sided weakness   . Debility   . DNR (do not resuscitate) discussion   . Stroke (cerebrum) ([place]) 08/21/2018  . Acute respiratory failure ([place]) 08/06/2018  . Generalized weakness 08/06/2018  . Diarrhea of presumed infectious origin 08/06/2018  . Community acquired pneumonia 08/06/2018  . H/O carotid endarterectomy 06/01/2016  . Primary osteoarthritis of right knee 05/31/2016  . Essential hypertension 05/31/2016  . H/O spinal stenosis 05/31/2016  . Pain in joint, lower leg 03/06/2014    Past Surgical History:  Procedure Laterality Date  . CAROTID ENDARTERECTOMY Left   . CATARACT EXTRACTION Bilateral   . COLONOSCOPY W/ BIOPSIES AND POLYPECTOMY    . HERNIA REPAIR    . KNEE SURGERY  Left   . NOSE SURGERY    . TONSILLECTOMY    . TOTAL KNEE ARTHROPLASTY Right 06/15/2016   Procedure: RIGHT TOTAL KNEE ARTHROPLASTY;  Surgeon: Renette Butters, MD;  Location: [facility];  Service: Orthopedics;  Laterality: Right;        Home Medications    Prior to Admission medications   Medication Sig Start Date End Date Taking? Authorizing Provider  acetaminophen (TYLENOL) 500 MG tablet Take 1,000 mg by mouth 3 (three) times daily as needed for moderate pain or headache. Reported on 07/29/2015    [provider]  albuterol (PROVENTIL HFA;VENTOLIN HFA) 108 (90 Base) MCG/ACT inhaler Inhale 2 puffs into the lungs every 6 (six) hours as needed (acute respiratory failure).     [provider]  albuterol (PROVENTIL) (2.5 MG/3ML) 0.083% nebulizer solution Take 2.5 mg by nebulization every 4 (four) hours as needed for wheezing (cough).     [provider]  aspirin 325 MG tablet Take 1 tablet (325 mg total) by mouth daily. 08/25/18   Metzger-Cihelka, York Cerise, NP  docusate sodium (COLACE) 100 MG capsule Take 1 capsule (100 mg total) by mouth 2 (two) times daily as needed for mild constipation. 08/10/18   Patrecia Pour, MD  feeding supplement, ENSURE ENLIVE, (ENSURE ENLIVE) LIQD Take 237 mLs by mouth 2 (two) times daily between meals. 08/10/18   Patrecia Pour, MD  furosemide (LASIX) 40 MG tablet Take 1  tablet (40 mg total) by mouth daily for 6 days. Patient taking differently: Take 40 mg by mouth 2 (two) times daily. Hold for systolic [id number]  Patrecia Pour, MD  loperamide (IMODIUM A-D) 2 MG capsule Take 1 capsule (2 mg total) by mouth every 4 (four) hours as needed for diarrhea or loose stools. 08/10/18   Patrecia Pour, MD  losartan (COZAAR) 100 MG tablet Take 100 mg by mouth daily.    [provider]  Multiple Vitamins-Minerals (CERTAVITE/ANTIOXIDANTS PO) Take 1 tablet by mouth daily.    [provider]  Nutritional Supplements (RESOURCE 2.0) LIQD Take  237 mLs by mouth 2 (two) times daily between meals. Vanilla flavor    [provider]  ondansetron (ZOFRAN) 4 MG tablet Take 1 tablet (4 mg total) by mouth every 8 (eight) hours as needed for nausea or vomiting. 06/15/16   Prudencio Burly III, PA-C    Family History Family History  Problem Relation Age of Onset  . Diabetes Mellitus II Neg Hx     Social History Social History   Tobacco Use  . Smoking status: Former Smoker    Packs/day: 0.50    Years: 20.00    Pack years: 10.00    Quit date: 03/06/1964    Years since quitting: 55.3  . Smokeless tobacco: Never Used  Substance Use Topics  . Alcohol use: Yes    Comment: occasional beer  . Drug use: No     Allergies   Patient has no known allergies.   Review of Systems Review of Systems  Unable to perform ROS: Dementia  Neurological: Positive for weakness.     Physical Exam Updated Vital Signs BP 104/69   Pulse (!) 103   Temp 97.9 F (36.6 C) (Oral)   Resp (!) 23   SpO2 93%   Physical Exam Vitals signs and nursing note reviewed.    85 year old male, resting comfortably and in no acute distress. Vital signs are significant for elevated respiratory rate. Oxygen saturation is 95%, which is normal. Head is normocephalic and atraumatic. PERRLA, EOMI. Oropharynx is clear. Neck is nontender and supple without adenopathy or JVD. Back is nontender and there is no CVA tenderness. Lungs are clear without rales, wheezes, or rhonchi. Chest is nontender. Heart has regular rate and rhythm without murmur. Abdomen is soft, flat, nontender without masses or hepatosplenomegaly and peristalsis is normoactive. Extremities have no cyanosis or edema, full range of motion is present. Skin is warm and dry without rash. Neurologic: Awake and oriented to person.  Demonstrates marked perseveration of speech.  Moderate to severe generalized tremor.  Difficult to assess for subtle motor deficits because of seizure, but no  gross weakness identified.  ED Treatments / Results  Labs (all labs ordered are listed, but only abnormal results are displayed) Labs Reviewed  URINALYSIS, ROUTINE W REFLEX MICROSCOPIC - Abnormal; Notable for the following components:      Result Value   Color, Urine AMBER (*)    APPearance HAZY (*)    Protein, ur 30 (*)    Bacteria, UA RARE (*)    All other components within normal limits  COMPREHENSIVE METABOLIC PANEL - Abnormal; Notable for the following components:   Glucose, Bld 118 (*)    BUN 37 (*)    AST 94 (*)    ALT 49 (*)    All other components within normal limits  CBG MONITORING, ED - Abnormal; Notable for the following components:  Glucose-Capillary 111 (*)    All other components within normal limits  CULTURE, BLOOD (ROUTINE X 2)  CULTURE, BLOOD (ROUTINE X 2)  URINE CULTURE  SARS CORONAVIRUS 2 (TAT 6-24 HRS)  LACTIC ACID, PLASMA  LACTIC ACID, PLASMA  CBC WITH DIFFERENTIAL/PLATELET  CBG MONITORING, ED    EKG EKG Interpretation  Date/Time:  Sunday June 24 2019 22:56:25 EST Ventricular Rate:  99 PR Interval:    QRS Duration: 148 QT Interval:  382 QTC Calculation: 491 R Axis:   91 Text Interpretation: Sinus tachycardia Premature ventricular complexes Right bundle branch block Borderline ST depression, diffuse leads When compared with ECG of 08/21/2018, No significant change was found Confirmed by Delora Fuel ([id number]) on 06/24/2019 11:00:41 PM   Radiology Dg Chest Port 1 View  Result Date: 06/24/2019 CLINICAL DATA:  Weakness. EXAM: PORTABLE CHEST 1 VIEW COMPARISON:  Radiograph 09/10/2018. CT 08/06/2018. FINDINGS: Unchanged heart size and mediastinal contours. Borderline cardiomegaly with aortic tortuosity, stable. Probable lingular scarring, as seen on prior CT. No acute airspace disease, pulmonary edema, large pleural effusion or pneumothorax. Degenerative change in the left shoulder. IMPRESSION: 1. No acute abnormality. 2. Lingular scarring.  Electronically Signed   By: Keith Rake M.D.   On: 06/24/2019 23:45    Procedures Procedures  Medications Ordered in ED Medications - No data to display   Initial Impression / Assessment and Plan / ED Course  I have reviewed the triage vital signs and the nursing notes.  Pertinent labs & imaging results that were available during my care of the patient were reviewed by me and considered in my medical decision making (see chart for details).  Weakness with concern for possible occult infection versus metabolic derangement.  Will check screening labs and check urinalysis for occult UTI and chest x-ray for occult pneumonia.  Will also screen for COVID-19.  Reportedly, his skilled care facility has had only one case of COVID-19.  Old records are reviewed confirming hospitalization for stroke last February with mild right sided weakness is ongoing deficit..  Labs show mild elevation in transaminases which is new, modest elevation of BUN which is unchanged from baseline with normal creatinine.  WBC and hemoglobin are normal.  Chest x-ray shows no evidence of pneumonia and urinalysis shows no evidence of UTI.  Family member has arrived and states that he actually seems to be at his baseline.  He is returned to his skilled nursing facility.  Follow-up with PCP in the next several days.  Return if symptoms worsen.[name] was evaluated in Emergency Department on 06/25/2019 for the symptoms described in the history of present illness. He was evaluated in the context of the global COVID-19 pandemic, which necessitated consideration that the patient might be at risk for infection with the SARS-CoV-2 virus that causes COVID-19. Institutional protocols and algorithms that pertain to the evaluation of patients at risk for COVID-19 are in a state of rapid change based on information released by regulatory bodies including the CDC and federal and state organizations. These policies and algorithms  were followed during the patient's care in the ED.  Final Clinical Impressions(s) / ED Diagnoses   Final diagnoses:  Generalized weakness    ED Discharge Orders    None       Delora Fuel, MD [id number] [phone]

## 2019-06-25 DIAGNOSIS — R531 Weakness: Secondary | ICD-10-CM | POA: Diagnosis not present

## 2019-06-25 LAB — LACTIC ACID, PLASMA
Lactic Acid, Venous: 1.5 mmol/L (ref 0.5–1.9)
Lactic Acid, Venous: 0.9 mmol/L (ref 0.5–1.9)

## 2019-06-25 LAB — COMPREHENSIVE METABOLIC PANEL
ALT: 49 U/L — ABNORMAL HIGH (ref 0–44)
AST: 94 U/L — ABNORMAL HIGH (ref 15–41)
Albumin: 3.7 g/dL (ref 3.5–5.0)
Alkaline Phosphatase: 91 U/L (ref 38–126)
Anion gap: 10 (ref 5–15)
BUN: 37 mg/dL — ABNORMAL HIGH (ref 8–23)
CO2: 24 mmol/L (ref 22–32)
Calcium: 9.5 mg/dL (ref 8.9–10.3)
Chloride: 106 mmol/L (ref 98–111)
Creatinine, Ser: 1 mg/dL (ref 0.61–1.24)
GFR calc Af Amer: >60 mL/min (ref >60)
GFR calc non Af Amer: >60 mL/min (ref >60)
Glucose, Bld: 118 mg/dL — ABNORMAL HIGH (ref 70–99)
Potassium: 4.2 mmol/L (ref 3.5–5.1)
Sodium: 140 mmol/L (ref 135–145)
Total Bilirubin: 1.1 mg/dL (ref 0.3–1.2)
Total Protein: 6.9 g/dL (ref 6.5–8.1)

## 2019-06-25 LAB — URINALYSIS, ROUTINE W REFLEX MICROSCOPIC
Bilirubin Urine: NEGATIVE
Glucose, UA: NEGATIVE mg/dL
Hgb urine dipstick: NEGATIVE
Ketones, ur: NEGATIVE mg/dL
Leukocytes,Ua: NEGATIVE
Nitrite: NEGATIVE
Protein, ur: 30 mg/dL — AB
Specific Gravity, Urine: 1.026 (ref 1.005–1.030)
pH: 5 (ref 5.0–8.0)

## 2019-06-25 LAB — SARS CORONAVIRUS 2 (TAT 6-24 HRS): SARS Coronavirus 2: NEGATIVE

## 2019-06-25 NOTE — ED Notes (Signed)
In and out cath performed by Wells Guiles, RN, with Caryl Pina, EMT assisting

## 2019-06-25 NOTE — ED Notes (Signed)
No urine sample at this time. Pt advised he would try to urinate.  Will continue to check back for sample.

## 2019-06-25 NOTE — ED Notes (Signed)
Redness and irritation noted to the pt's penis and scrotum with some malodorous discharge.  Small sore on the under-side of his penis is noted as well. Dr.Glick notified of same.

## 2019-06-25 NOTE — ED Notes (Signed)
[facility] called for transport.  

## 2019-06-25 NOTE — Discharge Instructions (Signed)
Return if you start running a fever, or if symptoms are getting worse.

## 2019-06-26 LAB — URINE CULTURE: Culture: NO GROWTH

## 2019-06-27 DIAGNOSIS — I10 Essential (primary) hypertension: Secondary | ICD-10-CM | POA: Diagnosis not present

## 2019-06-27 DIAGNOSIS — Z79899 Other long term (current) drug therapy: Secondary | ICD-10-CM | POA: Diagnosis not present

## 2019-06-27 DIAGNOSIS — G301 Alzheimer's disease with late onset: Secondary | ICD-10-CM | POA: Diagnosis not present

## 2019-06-27 DIAGNOSIS — J449 Chronic obstructive pulmonary disease, unspecified: Secondary | ICD-10-CM | POA: Diagnosis not present

## 2019-06-27 DIAGNOSIS — R531 Weakness: Secondary | ICD-10-CM | POA: Diagnosis not present

## 2019-06-30 LAB — CULTURE, BLOOD (ROUTINE X 2)
Culture: NO GROWTH
Culture: NO GROWTH
Special Requests: ADEQUATE
Special Requests: ADEQUATE

## 2019-07-20 DEATH — deceased
